# Patient Record
Sex: Female | Born: 1950
Health system: Southern US, Community
[De-identification: ages and names within clinical notes are randomized; demographics above are authoritative.]

## PROBLEM LIST (undated history)

## (undated) DIAGNOSIS — C569 Malignant neoplasm of unspecified ovary: Secondary | ICD-10-CM

## (undated) DIAGNOSIS — T7840XA Allergy, unspecified, initial encounter: Secondary | ICD-10-CM

## (undated) DIAGNOSIS — I309 Acute pericarditis, unspecified: Secondary | ICD-10-CM

## (undated) DIAGNOSIS — C50919 Malignant neoplasm of unspecified site of unspecified female breast: Secondary | ICD-10-CM

## (undated) DIAGNOSIS — I639 Cerebral infarction, unspecified: Secondary | ICD-10-CM

## (undated) DIAGNOSIS — I609 Nontraumatic subarachnoid hemorrhage, unspecified: Secondary | ICD-10-CM

## (undated) DIAGNOSIS — M1990 Unspecified osteoarthritis, unspecified site: Secondary | ICD-10-CM

## (undated) DIAGNOSIS — Z803 Family history of malignant neoplasm of breast: Secondary | ICD-10-CM

## (undated) DIAGNOSIS — M810 Age-related osteoporosis without current pathological fracture: Secondary | ICD-10-CM

## (undated) DIAGNOSIS — Z8481 Family history of carrier of genetic disease: Secondary | ICD-10-CM

## (undated) DIAGNOSIS — Z800 Family history of malignant neoplasm of digestive organs: Secondary | ICD-10-CM

## (undated) HISTORY — PX: POLYPECTOMY: SHX149

## (undated) HISTORY — DX: Nontraumatic subarachnoid hemorrhage, unspecified: I60.9

## (undated) HISTORY — DX: Age-related osteoporosis without current pathological fracture: M81.0

## (undated) HISTORY — DX: Malignant neoplasm of unspecified site of unspecified female breast: C50.919

## (undated) HISTORY — DX: Allergy, unspecified, initial encounter: T78.40XA

## (undated) HISTORY — DX: Family history of malignant neoplasm of digestive organs: Z80.0

## (undated) HISTORY — DX: Family history of carrier of genetic disease: Z84.81

## (undated) HISTORY — PX: TONSILLECTOMY: SUR1361

## (undated) HISTORY — PX: TOTAL ABDOMINAL HYSTERECTOMY W/ BILATERAL SALPINGOOPHORECTOMY: SHX83

## (undated) HISTORY — PX: HERNIA REPAIR: SHX51

## (undated) HISTORY — DX: Acute pericarditis, unspecified: I30.9

## (undated) HISTORY — DX: Family history of malignant neoplasm of breast: Z80.3

## (undated) HISTORY — DX: Cerebral infarction, unspecified: I63.9

---

## 1999-08-16 ENCOUNTER — Other Ambulatory Visit: Admission: RE | Admit: 1999-08-16 | Discharge: 1999-08-16 | Payer: Self-pay | Admitting: Obstetrics and Gynecology

## 2000-01-19 ENCOUNTER — Encounter (INDEPENDENT_AMBULATORY_CARE_PROVIDER_SITE_OTHER): Payer: Self-pay | Admitting: Specialist

## 2000-01-19 ENCOUNTER — Ambulatory Visit (HOSPITAL_BASED_OUTPATIENT_CLINIC_OR_DEPARTMENT_OTHER): Admission: RE | Admit: 2000-01-19 | Discharge: 2000-01-19 | Payer: Self-pay | Admitting: Surgery

## 2001-01-17 DIAGNOSIS — C569 Malignant neoplasm of unspecified ovary: Secondary | ICD-10-CM

## 2001-01-17 HISTORY — DX: Malignant neoplasm of unspecified ovary: C56.9

## 2001-04-12 ENCOUNTER — Other Ambulatory Visit: Admission: RE | Admit: 2001-04-12 | Discharge: 2001-04-12 | Payer: Self-pay | Admitting: Obstetrics and Gynecology

## 2001-04-17 ENCOUNTER — Encounter (INDEPENDENT_AMBULATORY_CARE_PROVIDER_SITE_OTHER): Payer: Self-pay

## 2001-04-17 ENCOUNTER — Inpatient Hospital Stay (HOSPITAL_COMMUNITY): Admission: RE | Admit: 2001-04-17 | Discharge: 2001-04-20 | Payer: Self-pay | Admitting: *Deleted

## 2001-04-25 ENCOUNTER — Ambulatory Visit: Admission: RE | Admit: 2001-04-25 | Discharge: 2001-04-25 | Payer: Self-pay | Admitting: Gynecology

## 2007-06-09 ENCOUNTER — Emergency Department (HOSPITAL_COMMUNITY): Admission: EM | Admit: 2007-06-09 | Discharge: 2007-06-09 | Payer: Self-pay | Admitting: Family Medicine

## 2007-08-22 ENCOUNTER — Other Ambulatory Visit: Admission: RE | Admit: 2007-08-22 | Discharge: 2007-08-22 | Payer: Self-pay | Admitting: Gynecology

## 2010-06-04 NOTE — Consult Note (Signed)
[facility]  Patient:    KIOSHA, BUCHAN Visit Number: [id number] MRN: [id number]          Service Type: Attending:  Rande Brunt. Clarke-Pearson, M.D. Dictated by:   Rande Brunt. Clarke-Pearson, M.D. Proc. Date: 04/17/01   CC:         Telford Nab, R.N.   Consultation Report  HISTORY OF PRESENT ILLNESS:  The patient is a 60 year old white female, patient of Dr. Jeanine Luz.  She presented initially with semiacute abdominal pain last Friday and was found to have bilateral complex ovarian cysts.  At the time of exploratory laparotomy today she was found to have bilateral complex cysts which, on frozen section, were ovarian cancer.  Consultation was sought by Dr. Jeanine Luz regarding surgical management intraoperatively.  Given the patients surgical findings included  apparent nodule of metastatic disease in the sigmoid mesentery which was excised.  The remainder of the abdomen and pelvis were free of disease and, therefore, comprehensive surgical staging was advised.  IMPRESSION:  Poorly differentiated ovarian cancer.  PLAN:  Recommend the patient complete comprehensive surgical staging including omentectomy, pelvic and peritoneal biopsies, pelvic and para-aortic lymphadenectomy, and resection of any other obvious tumor nodules. Dictated by:   Rande Brunt. Clarke-Pearson, M.D. Attending:  Rande Brunt. Clarke-Pearson, M.D. DD:  04/18/01 TD:  04/19/01 Job: [id number]

## 2010-06-04 NOTE — Discharge Summary (Signed)
[facility]  Patient:    Vicki Hale, Vicki Hale Visit Number: [id number] MRN: [id number]          Service Type: SUR Location: 4W [id number] 01 Attending Physician:  Collene Schlichter Dictated by:   Almedia Balls Randell Patient, M.D. Admit Date:  04/17/2001 Discharge Date: 04/20/2001   CC:         Vicki Hale, M.D.   Discharge Summary  HISTORY OF PRESENT ILLNESS:  The patient is a 60 year old who had a symptomatic large pelvic cystic mass found by Harl Bowie, M.D., approximately five days prior to admission.  Because of the discomfort and the size of the mass, it was felt that immediate admission was necessary.  The remainder of her history and physical are as previously dictated.  LABORATORY DATA:  Hemoglobin 13.9.  The hemoglobin on April 18, 2001, was 12.5. Glucose elevated at 147, BUN low at 4, calcium low at 8.3, total protein low at 5.1, albumin low at 2.8.  The electrocardiogram showed sinus bradycardia, but otherwise was normal.  HOSPITAL COURSE:  The patient was taken to the operating room on April 17, 2001, at which time exploratory laparotomy, TAH, BSO, omentectomy, node dissection, and peritoneal biopsies were performed for what was initially felt to be a stage IB carcinoma of the ovaries.  The patient did well postoperatively.  Diet and ambulation progressed over several days postoperatively.  On the morning of April 20, 2001, she was afebrile and had a return of bowel activity, so it was felt that she could be discharged at this time.  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  To be determined later.  OPERATIONS: 1. Total abdominal hysterectomy. 2. Bilateral salpingo-oophorectomy. 3. Omentectomy. 4. Lymph node dissection. 5. Peritoneal biopsies.  Report to date on the pathology showed poorly differentiated probable serous cystadenocarcinoma of both ovaries, positive nodules in the  posterior cul-de-sac, and paracecal areas, negative lymph nodes, and positive cytology with cytology positive of the diaphragm scrapings.  Peritoneal biopsies were negative.  The final report on the omentum is unknown at this time.  DISPOSITION:  Discharged home.  FOLLOW-UP:  To return to see Vicki Hale, M.D., on April 25, 2001, and to return to our office in approximately two weeks for follow-up.  ACTIVITY:  She was instructed to gradually progress her activities over several weeks at home and to limit lifting and driving for two weeks.  She was fully ambulatory.  DIET:  Regular.  CONDITION ON DISCHARGE:  Good.  DISCHARGE MEDICATIONS:  She was given prescriptions for Darvocet-N 100 or generic, #30, to be taken one or two q.4h. p.r.n. pain and Vivelle Dot 0.05 mg to be changed twice weekly.  SPECIAL INSTRUCTIONS:  She will call for any problems. Dictated by:   Almedia Balls Randell Patient, M.D. Attending Physician:  Collene Schlichter DD:  04/21/01 TD:  04/23/01 Job: [id number]

## 2010-06-04 NOTE — Op Note (Signed)
[facility]  Patient:    Vicki Hale, Vicki Hale Visit Number: [id number] MRN: [id number]          Service Type: Attending:  Almedia Balls. Randell Patient, M.D. Dictated by:   Almedia Balls Randell Patient, M.D. Proc. Date: 04/17/01                             Operative Report  PREOPERATIVE DIAGNOSES:  Large cystic pelvic mass, pelvic pain, elevated CA 125, rule out carcinoma.  POSTOPERATIVE DIAGNOSES:  Stage 1B, possible stage 2 carcinoma of the ovary poorly differentiated on frozen section.  OPERATION PERFORMED:  Abdominal supracervical hysterectomy, bilateral salpingo-oophorectomy, omentectomy, lymph node dissection by Dr. Stanford Breed who will dictate that portion of the procedure.  ANESTHESIA:  General oral tracheal.  SURGEON:  Almedia Balls. Randell Patient, M.D.  FIRST ASSISTANT:  Harl Bowie, M.D. and Rande Brunt. Clarke-Pearson, M.D. who did the node dissection.  INDICATIONS FOR PROCEDURE:  The patient is a 60 year old with approximately a one week history of pelvic pain who was evaluated by Dr. Laureen Ochs and referred to [place] for evaluation.  She was seen in our office on April 13, 2001 with findings of a large pelvic mass which was cystic on ultrasound. CA 125 was elevated at 35.6. She was counselled as to the need for surgery to evaluate and treat this problem and the type surgery to be performed which would possibly include a hysterectomy, bilateral salpingo-oophorectomy, omentectomy and lymph node dissection. She was fully counselled as to the nature of each portion of this procedure and the risks involved to include risks of anesthesia, injury to bowel, bladder, blood vessels, ureters, postoperative hemorrhage, infection, and recuperation. She fully understands all these considerations and wishes to proceed on April 17, 2001.  OPERATIVE FINDINGS:  On entry into the abdomen, there was a large cystic mass which rose out of the right ovary. The right adnexal structures were  slightly torded. The left ovary contained a multicystic mass as well. The uterus appeared normal. The right ovarian cyst was approximately 12-14 cm in greatest dimensions. Exploration of the entire abdomen revealed that the diaphragm, liver surface, spleen, periaortic areas, kidneys, area of the appendix and the appendix itself were normal except for a small nodule just off the cecum near the appendix. There was an approximately 1.5 x 1 cm nodular area to the right of the descending rectosigmoid in the posterior cul-de-sac. Frozen section on the ovarian cyst revealed bilateral carcinoma of a poorly differentiated type probably serous.  DESCRIPTION OF PROCEDURE:  With the patient under general anesthesia, prepared and in the usual sterile fashion, with a Foley catheter in the bladder, a lower abdominal transverse incision was made after excising a previous cesarean section scar. The incision was carried into the peritoneal cavity without difficulty. Pelvic washings were taken. The cyst was encountered immediately on the right ovary and was brought up through the incision. Heaney clamps were placed across the base of the cyst which was then excised and sent for frozen section. The left ovary was also brought up out of the incision, and because of the multicystic nature of this ovary, Heaney clamps were placed across the base for excision and frozen section on this structure as well. Both were returned with a diagnosis as noted above. It was then possible to place a self retaining retractor and pack off the bowel. Kelly clamps were used to clamp the utero-ovarian anastomoses, tubes, and round ligaments bilaterally for traction and  hemostasis. The round ligaments were then transected using Bovie electrocoagulation with entry into the retroperitoneal space, development of the paravesical and pararectal spaces. The bladder flap was developed on the anterior surface of the uterus. Skeletonization  of the vessels was carried out bilaterally, and then these vascular bundles were clamped bilaterally, cut, and suture ligated with #1 chromic catgut. The cardinal ligaments were likewise clamped bilaterally, cut, and suture ligated with #1 chromic catgut. It was then possible to core out the endocervix leaving a rim of exocervix for support and vascularity. Interrupted figure-of-eight sutures were used to reapproximate the cervical stump and to render it hemostatic. A report was then obtained that the ovaries were malignant.  Attention was then directed to the omentectomy which was performed by sequentially clamping across the omentum using Kelly clamps with excision of the omentum. The remaining portion of the omentum was rendered hemostatic with suture ligatures of #1 chromic catgut. Further extension of the incisions for the retroperitoneal space was then accomplished bilaterally. At this point, Dr. Stanford Breed was present in the operating room and continued with the lymph node dissection which consisted of excision of pelvic periaortic lymph node tissue, peritoneal biopsies, excision of a nodule on the cecum and obtaining cytologic study of the diaphragm. As noted above, he will dictate this portion of the procedure. After noting that hemostasis was maintained following all surgery and that sponge and instrument counts were correct x2, the peritoneum was closed with a continuous suture of #0 Vicryl after a copious lavage with lactated Ringers solution. The fascia was closed with two continuous sutures of #0 Vicryl which were brought from the lateral aspects of the incision and tied separately in the midline. The subcutaneous fat was closed with interrupted sutures of #0 Vicryl. The skin was closed with a subcuticular suture of 3-0 plain catgut. Estimated blood loss of 600 ml. The patient was taken to the recovery room in good condition with clear urine in the Foley catheter tubing.  She will be admitted following surgery. Dictated by:   Almedia Balls Randell Patient, M.D. Attending:  Almedia Balls. Randell Patient, M.D. DD:  04/17/01 TD:  04/18/01  Job: [id number]

## 2010-06-04 NOTE — Op Note (Signed)
[facility]  Patient:    Vicki Hale, Vicki Hale                      MRN: [id number] Proc. Date: 01/19/00 Adm. Date:  [id number] Attending:  Charlton Haws CC:         Harl Bowie, M.D.   Operative Report  CCS# [id number]  PREOPERATIVE DIAGNOSIS:  Right inguinal hernia.  POSTOPERATIVE DIAGNOSIS:  Right indirect inguinal hernia.  OPERATION PERFORMED:  Repair of right inguinal hernia with mesh.  SURGEON:  Currie Paris, M.D.  ANESTHESIA:  MAC.  INDICATIONS FOR PROCEDURE:  This patient has small symptomatic right inguinal hernia.  It was decided to proceed to elective repair.  DESCRIPTION OF PROCEDURE:  The patient was brought to the operating room after having the site identified preoperatively.  She was given IV sedation and the right side was prepped and draped as a sterile field.  She was given IV sedation.  1% Xylocaine was mixed equally with 0.5% Marcaine with epinephrine and used for local.  I infiltrated along the incision line and then in a subfascial area above and the anterior superior iliac spine.  Incision was made and deepened to the external oblique aponeurosis with additional local being infiltrated as needed and as we went through layers.  Vessels were either coagulated or clamped, ligated with 4-0 Vicryl.  The external oblique was opened in line with its fibers to expose the underlying tissues and the round ligament with the ilioinguinal nerve was found and dissected up off the inguinal floor and surrounded with a Penrose drain.  I separated the ilioinguinal nerve a little bit off of that to get a little better exposure of the structures and with a little dissection, the anteromedial aspect of these structures was able to identify a hernia sac.  This was stripped out from the distal aspect and then cleaned off and stripped down to the deep ring where it was twisted, suture ligated with a 2-0 silk, tied with 2-0 silk and  amputated. It retracted neatly in to the deep ring.  There appeared to be no other indirect defect although just medial to the deep ring was a little bit of weakness. I elected therefore, to put a piece of mesh in and used a mesh patch that comes with a plug and patch kit, laid it along the floor so that it overlapped the pubic tubercle.  It was sutured in with a 2-0 Prolene laterally and then tacked medially to the internal oblique.  The tails went around the residual of the round ligament which was left in situ and not divided. Everything appeared to be dry.  The external oblique was closed over the repair with 3-0 Vicryl, Scarpas with 3-0 Vicryl, and skin with 4-0 Monocryl subcuticular plus Steri-Strips.  The patient tolerated the procedure well.  There were no operative complications. All counts were correct. DD:  01/19/00 TD:  01/19/00 Job: [id number]

## 2010-06-04 NOTE — Consult Note (Signed)
[facility]  Patient:    Vicki Hale, Vicki Hale Visit Number: [id number] MRN: [id number]          Service Type: GON Location: GYN Attending Physician:  Jeannette Corpus Dictated by:   Rande Brunt. Clarke-Pearson, M.D. Proc. Date: 04/25/01 Admit Date:  04/25/2001   CC:         Almedia Balls. Randell Patient, M.D.  Harl Bowie, M.D.  Telford Nab, R.N.   Consultation Report  HISTORY OF PRESENT ILLNESS:  This 60 year old white female is seen in consultation at the request of Dr. Jeanine Luz regarding management of a newly diagnosed ovarian cancer.  The patient underwent exploratory laparotomy on April 17, 2001, for management of abdominal pain and a pelvic mass.  She was found to have a poorly differentiated ovarian carcinoma involving both ovaries.  Further surgical staging including total abdominal hysterectomy and bilateral salpingo-oophorectomy, omentectomy, pelvic and periaortic lymphadenectomy, multiple peritoneal biopsies, and excision of peritoneal nodules revealed the patient had stage IIIB disease by virtue of metastasis to the cecum.  In addition, she had positive peritoneal washings, a positive right diaphragm scrape, and a single implant in the pelvis.  All lymph nodes are free of disease as was the omentum.  At the completion of the surgical procedure, there was no residual disease identifiable.  The patient has had an uncomplicated postoperative course.  PHYSICAL EXAMINATION:  VITAL SIGNS:  Height 5 feet 6 inches, weight 140.5 pounds, blood pressure 116/80, pulse 80, respirations 16.  IMPRESSION:  Stage IIIB completely resected poorly differentiated ovarian cancer.  RECOMMENDATIONS:  I had approximately a one hour consultation face to face with the patient and her husband and mother, outlining the natural history of this disease, the patients prognosis, and treatment options.  We also discussed potential side effects of chemotherapy.  We  talked about standard therapy with carboplatin and Taxol, and also the potential for participating in GOG protocol 182.  After discussing all this, the patient and her husband have indicated they would like to be treated at [facility] as this is nearly as close to their home as is [facility].  We will arrange for her to be seen at [facility] within the next week and initiate chemotherapy hopefully within two weeks.  All of their questions are answered for today. Dictated by:   Rande Brunt. Clarke-Pearson, M.D. Attending Physician:  Jeannette Corpus DD:  04/25/01 TD:  04/25/01 Job: [id number]
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
## 2010-06-04 NOTE — Op Note (Signed)
[facility]  Patient:    Vicki Hale, Vicki Hale Visit Number: [id number] MRN: [id number]          Service Type: Attending:  Rande Brunt. Clarke-Pearson, M.D. Dictated by:   Rande Brunt. Clarke-Pearson, M.D.   CC:         Almedia Balls. Randell Patient, M.D.  Harl Bowie, M.D.  Telford Nab, R.N.   Operative Report  PREOPERATIVE DIAGNOSIS:  Complex adnexal masses.  POSTOPERATIVE DIAGNOSIS:  Ovarian cancer.  SURGEON:  Daniel L. Clarke-Pearson, M.D.  PROCEDURE: 1. Pelvic and periaortic lymphadenectomy. 2. Multiple peritoneal biopsies. 3. Excision of peritoneal (cecal) nodule.  ASSISTANT:  Almedia Balls. Randell Patient, M.D.  ANESTHESIA:  General with orotracheal tube.  ESTIMATED BLOOD LOSS:  100 cc for this portion of the procedure.  SURGICAL FINDINGS:  Upon entering the operating room and exploring the pelvis, the patient had previously undergone a total abdominal hysterectomy and bilateral salpingo-oophorectomy, partial omentectomy and resection of a pelvic nodule. Exploration of the upper abdomen, including the diaphragm, liver capsule, spleen, stomach, residual omentum, small and large bowel revealed no evidence of metastatic disease except for one minimally suspicious nodule measuring approximately 5 mm on the cecum. This was excised in its entirety. Inspection of the pelvis, including pelvic and periaortic lymph nodes, revealed no suggestion of metastatic disease. At the completion the surgical procedure, there was no gross residual disease.  DESCRIPTION OF PROCEDURE:  When I entered the operating room, the abdomen was previously opened through a Pfannenstiel incision. The abdomen and pelvis were explored with the above-noted findings. A Bookwalter retractor was assembled and the bowel packed out of the pelvis. Attention was turned to the pelvic sidewalls. The retroperitoneal spaces were opened developing the pararectal and paravesical spaces. In a systematic fashion, the  external iliac lymph nodes from the external iliac artery and vein were harvested. Hemostasis was achieved with cautery and Hemoclips. Using the vein retractor to elevate the external iliac vein, the obturator fossa was opened and the obturator lymph nodes were excised with care to avoid injury to the obturator nerve. Again, Hemoclips were used for hemostasis. A similar procedure was performed on the opposite side of the pelvis.  The Bookwalter retractor was positioned to explore the upper abdomen. Using the [facility] blades, the upper abdomen was identified. The small bowel was mobilized to the right and an incision was made over the peritoneum on the right common iliac artery and along the right side of the aorta. With the right ureter reflected laterally, the lymph nodes overlying the vena cava and aorta were dissected in their entirety. There was no suspicious lymph nodes in this group. These were submitted as right periaortic lymph nodes.  Attention was turned to the left side of the abdomen where an incision was made along the white line of Toldt along the descending colon. The descending colon and ureter were mobilized medially, thus exposing the aorta and the left side of the aortic lymph nodes. In a stepwise fashion, the lymph nodes from the common iliac chain and aortic chain were excised. Again, hemostasis was achieved with Hemoclips. These were submitted to pathology as the left periaortic lymph nodes.  Peritoneal biopsies were obtained from the right and left paracolic gutters, right and left pelvis, anterior and posterior cul-de-sac. A scraping from the right diaphragm was obtained and submitted to cytopathology as a "Pap smear".  The pelvis and abdomen were reassessed and hemostasis was adequate. At this juncture, I left the operating room and Dr. Jeanine Luz and  Dr. Leona Singleton closed the abdomen. Dictated by:   Rande Brunt. Clarke-Pearson, M.D. Attending:  Rande Brunt.  Clarke-Pearson, M.D. DD:  04/18/01 TD:  04/19/01 Job: [id number]

## 2010-06-30 ENCOUNTER — Other Ambulatory Visit: Payer: Self-pay | Admitting: Dermatology

## 2010-10-13 LAB — CULTURE, ROUTINE-ABSCESS

## 2011-06-30 ENCOUNTER — Other Ambulatory Visit: Payer: Self-pay

## 2014-01-17 HISTORY — PX: COLONOSCOPY: SHX174

## 2014-05-08 ENCOUNTER — Other Ambulatory Visit: Payer: Self-pay | Admitting: Family Medicine

## 2014-05-08 DIAGNOSIS — R51 Headache: Principal | ICD-10-CM

## 2014-05-08 DIAGNOSIS — R519 Headache, unspecified: Secondary | ICD-10-CM

## 2014-05-09 ENCOUNTER — Ambulatory Visit
Admission: RE | Admit: 2014-05-09 | Discharge: 2014-05-09 | Disposition: A | Discharge status: Home / Self Care | Payer: BC Managed Care – PPO | Source: Ambulatory Visit | Attending: Family Medicine | Admitting: Family Medicine

## 2014-05-09 DIAGNOSIS — R519 Headache, unspecified: Secondary | ICD-10-CM

## 2014-05-09 DIAGNOSIS — R51 Headache: Principal | ICD-10-CM

## 2014-05-09 MED ORDER — GADOBENATE DIMEGLUMINE 529 MG/ML IV SOLN
15.0000 mL | Freq: Once | INTRAVENOUS | Status: AC | PRN
  Administered 2014-05-09: 15 mL via INTRAVENOUS

## 2014-05-11 ENCOUNTER — Emergency Department (HOSPITAL_COMMUNITY): Payer: BC Managed Care – PPO

## 2014-05-11 ENCOUNTER — Encounter (HOSPITAL_COMMUNITY): Payer: Self-pay | Admitting: *Deleted

## 2014-05-11 ENCOUNTER — Inpatient Hospital Stay (HOSPITAL_COMMUNITY)
Admission: EM | Admit: 2014-05-11 | Discharge: 2014-05-15 | DRG: 066 | Disposition: A | Discharge status: Home / Self Care | Payer: BC Managed Care – PPO | Attending: Internal Medicine | Admitting: Internal Medicine

## 2014-05-11 DIAGNOSIS — Z880 Allergy status to penicillin: Secondary | ICD-10-CM

## 2014-05-11 DIAGNOSIS — Z8543 Personal history of malignant neoplasm of ovary: Secondary | ICD-10-CM

## 2014-05-11 DIAGNOSIS — R001 Bradycardia, unspecified: Secondary | ICD-10-CM | POA: Diagnosis not present

## 2014-05-11 DIAGNOSIS — I609 Nontraumatic subarachnoid hemorrhage, unspecified: Secondary | ICD-10-CM | POA: Diagnosis not present

## 2014-05-11 DIAGNOSIS — H538 Other visual disturbances: Secondary | ICD-10-CM | POA: Diagnosis not present

## 2014-05-11 HISTORY — DX: Malignant neoplasm of unspecified ovary: C56.9

## 2014-05-11 LAB — BASIC METABOLIC PANEL
Anion gap: 12 (ref 5–15)
BUN: 13 mg/dL (ref 6–23)
CO2: 20 mmol/L (ref 19–32)
Calcium: 9.4 mg/dL (ref 8.4–10.5)
Chloride: 101 mmol/L (ref 96–112)
Creatinine, Ser: 0.87 mg/dL (ref 0.50–1.10)
GFR calc Af Amer: 81 mL/min — ABNORMAL LOW (ref >90)
GFR calc non Af Amer: 70 mL/min — ABNORMAL LOW (ref >90)
Glucose, Bld: 139 mg/dL — ABNORMAL HIGH (ref 70–99)
Potassium: 3.8 mmol/L (ref 3.5–5.1)
Sodium: 133 mmol/L — ABNORMAL LOW (ref 135–145)

## 2014-05-11 LAB — CBC
HCT: 42.5 % (ref 36.0–46.0)
Hemoglobin: 14.4 g/dL (ref 12.0–15.0)
MCH: 29.4 pg (ref 26.0–34.0)
MCHC: 33.9 g/dL (ref 30.0–36.0)
MCV: 86.9 fL (ref 78.0–100.0)
Platelets: 285 10*3/uL (ref 150–400)
RBC: 4.89 MIL/uL (ref 3.87–5.11)
RDW: 12.9 % (ref 11.5–15.5)
WBC: 8.7 10*3/uL (ref 4.0–10.5)

## 2014-05-11 MED ORDER — HYDROMORPHONE HCL 1 MG/ML IJ SOLN
1.0000 mg | Freq: Once | INTRAMUSCULAR | Status: AC
Start: 2014-05-11 — End: 2014-05-11
  Administered 2014-05-11: 1 mg via INTRAVENOUS
  Filled 2014-05-11: qty 1

## 2014-05-11 MED ORDER — ONDANSETRON HCL 4 MG/2ML IJ SOLN
4.0000 mg | Freq: Once | INTRAMUSCULAR | Status: AC
  Administered 2014-05-11: 4 mg via INTRAVENOUS
  Filled 2014-05-11: qty 2

## 2014-05-11 NOTE — ED Notes (Signed)
Pt states that she started experiencing severe headaches with nausea on Friday the 15th. States she also had the same the next Sunday and Tuesday. Saw her PCP Wednesday and went to MRI on Friday. States that her PCP called her and stated that she had a subarachnoid bleed but no aneurysm. Pt reports that she began having another severe headache and nausea 3 hours ago. Pt states that she has taken 3 extra strength tylenols and 1/2 hydrocodone with no relief.

## 2014-05-11 NOTE — ED Notes (Signed)
Patient transported to CT 

## 2014-05-11 NOTE — ED Provider Notes (Signed)
CSN: [id number]     Arrival date & time 05/11/14  2214 History   First MD Initiated Contact with Patient 05/11/14 2240     Chief Complaint  Patient presents with  . Headache     (Consider location/radiation/quality/duration/timing/severity/associated sxs/prior Treatment) HPI Comments: Patient presents emergency department with chief complaint of headache. Patient states that she has been experiencing intermittent severe headaches 1 week. States that she had an MRI on Friday of this week and was told that she had a small subarachnoid hemorrhage. Her primary care doctor is arranging follow-up with the neurosurgeon for her. Patient states that today her headache returned and was very severe. She states that the pain is 9 out of 10. She has taken Tylenol and hydrocodone with no relief. She denies any vision changes, slurred speech, numbness, tingling, or weakness of the extremities. There are no aggravating or alleviating factors.  The history is provided by the patient. No language interpreter was used.    Past Medical History  Diagnosis Date  . Ovarian cancer    Past Surgical History  Procedure Laterality Date  . Tonsillectomy    . Hernia repair     No family history on file. History  Substance Use Topics  . Smoking status: Never Smoker   . Smokeless tobacco: Not on file  . Alcohol Use: Yes     Comment: ocassional   OB History    No data available     Review of Systems  Constitutional: Negative for fever and chills.  Respiratory: Negative for shortness of breath.   Cardiovascular: Negative for chest pain.  Gastrointestinal: Negative for nausea, vomiting, diarrhea and constipation.  Genitourinary: Negative for dysuria.  Neurological: Positive for headaches.      Allergies  Penicillins  Home Medications   Prior to Admission medications   Not on File   BP 168/90 mmHg  Pulse 56  Temp(Src) 98.3 F (36.8 C) (Oral)  Resp 18  Ht 5\' 7"  (1.702 m)  Wt 175 lb (79.379  kg)  BMI 27.40 kg/m2  SpO2 94% Physical Exam  Constitutional: She is oriented to person, place, and time. She appears well-developed and well-nourished.  HENT:  Head: Normocephalic and atraumatic.  Right Ear: External ear normal.  Left Ear: External ear normal.  Eyes: Conjunctivae and EOM are normal. Pupils are equal, round, and reactive to light.  Neck: Normal range of motion. Neck supple.  No pain with neck flexion, no meningismus  Cardiovascular: Normal rate, regular rhythm and normal heart sounds.  Exam reveals no gallop and no friction rub.   No murmur heard. Pulmonary/Chest: Effort normal and breath sounds normal. No respiratory distress. She has no wheezes. She has no rales. She exhibits no tenderness.  Abdominal: Soft. She exhibits no distension and no mass. There is no tenderness. There is no rebound and no guarding.  Musculoskeletal: Normal range of motion. She exhibits no edema or tenderness.  Normal gait.  Neurological: She is alert and oriented to person, place, and time. She has normal reflexes.  CN 3-12 intact, normal finger to nose, no pronator drift, normal heel-to-shin, no visual field cuts, sensation and strength intact bilaterally.  Skin: Skin is warm and dry.  Psychiatric: She has a normal mood and affect. Her behavior is normal. Judgment and thought content normal.  Nursing note and vitals reviewed.   ED Course  Procedures (including critical care time) Labs Review Labs Reviewed  CBC  BASIC METABOLIC PANEL    Imaging Review No results found.  EKG Interpretation None      MDM   Final diagnoses:  None    Patient with known subarachnoid hemorrhage and worsening headache. Also having vomiting secondary to headache. Will treat pain, nausea, and check CT.  Asked CT to take the patient quickly.  CT shows persistent subarachnoid hemorrhage. I was informed by the radiologist that volumes cannot be compared between MRI and CT.  Patient is  neurovascularly intact.    Discussed the patient with Dr. Lita Mains, who recommends consulting neurosurgery.  Patient's pain has significantly improved.   1:22 AM Patient signed out to Dr. Lita Mains, who will speak with neurosurgery and dispo patient.   Montine Circle, PA-C 05/12/14 0123  Julianne Rice, MD 05/12/14 [phone]

## 2014-05-12 DIAGNOSIS — H539 Unspecified visual disturbance: Secondary | ICD-10-CM | POA: Diagnosis not present

## 2014-05-12 DIAGNOSIS — R001 Bradycardia, unspecified: Secondary | ICD-10-CM | POA: Diagnosis not present

## 2014-05-12 DIAGNOSIS — H538 Other visual disturbances: Secondary | ICD-10-CM | POA: Diagnosis not present

## 2014-05-12 DIAGNOSIS — I609 Nontraumatic subarachnoid hemorrhage, unspecified: Principal | ICD-10-CM

## 2014-05-12 DIAGNOSIS — Z8543 Personal history of malignant neoplasm of ovary: Secondary | ICD-10-CM | POA: Diagnosis not present

## 2014-05-12 DIAGNOSIS — Z880 Allergy status to penicillin: Secondary | ICD-10-CM | POA: Diagnosis not present

## 2014-05-12 LAB — MRSA PCR SCREENING: MRSA by PCR: NEGATIVE

## 2014-05-12 MED ORDER — ACETAMINOPHEN 325 MG PO TABS
650.0000 mg | ORAL_TABLET | Freq: Four times a day (QID) | ORAL | Status: DC | PRN
  Administered 2014-05-12 – 2014-05-15 (×4): 650 mg via ORAL
  Filled 2014-05-12 (×4): qty 2

## 2014-05-12 MED ORDER — GUAIFENESIN-DM 100-10 MG/5ML PO SYRP: 5.0000 mL | ORAL_SOLUTION | ORAL | Status: DC | PRN

## 2014-05-12 MED ORDER — ALUM & MAG HYDROXIDE-SIMETH 200-200-20 MG/5ML PO SUSP: 30.0000 mL | Freq: Four times a day (QID) | ORAL | Status: DC | PRN

## 2014-05-12 MED ORDER — HYDRALAZINE HCL 20 MG/ML IJ SOLN: 10.0000 mg | Freq: Four times a day (QID) | INTRAMUSCULAR | Status: DC | PRN

## 2014-05-12 MED ORDER — DOCUSATE SODIUM 100 MG PO CAPS
200.0000 mg | ORAL_CAPSULE | Freq: Two times a day (BID) | ORAL | Status: DC
  Administered 2014-05-12 – 2014-05-15 (×6): 200 mg via ORAL
  Filled 2014-05-12 (×5): qty 2

## 2014-05-12 NOTE — ED Notes (Signed)
Neuro MD at bedside

## 2014-05-12 NOTE — ED Notes (Signed)
Patient had episode of vomiting upon returning to room from [place], [place] aware, advised patient can have 4mg  of zofran.

## 2014-05-12 NOTE — Progress Notes (Signed)
Admission history reviewed. Pt has no hx of HA, is a non-smoker, does not use any Rx medications, and has no FH of intracranial hemorrhage or aneurysm.  EXAM:  BP 93/56 mmHg  Pulse 67  Temp(Src) 98 F (36.7 C) (Oral)  Resp 20  Ht 5\' 7"  (1.702 m)  Wt 78 kg (171 lb 15.3 oz)  BMI 26.93 kg/m2  SpO2 95%  Awake, alert, oriented  Speech fluent, appropriate  CN grossly intact  5/5 BUE/BLE   IMPRESSION:  64 y.o. female with left convexity SAH of unclear etiology. History and radiographic findings are very atypical for aneurysmal SAH, other possibilities include RCVS or amyloid  PLAN: - Will plan on diagnostic cerebral angiogram for further workup tomorrow.  I reviewed imaging findings and the need for angiogram with the patient and family. All questions were answered.

## 2014-05-12 NOTE — H&P (Signed)
[facility] History and Physical  Vicki Hale WIO:[id number] DOB: Apr 06, 1950 DOA: 05/11/2014  Referring physician: EDP PCP: Leonides Sake, MD   Chief Complaint: Headache   HPI: Vicki Hale is a 64 y.o. female who developed sudden onset of severe headache 10 days ago.  This was so severe that it caused her to drop to her knees she says.  Very severe for several hours but then gradually resolved.  Had 2nd episode a couple days later and saw PCP.  PCP ordered MRI 4 days ago.  MRI was performed 2 days ago and showed SAH.  PCP wanted angiogram apparently but this was apparently rejected by insurance.  Patient now presents to ED with 3rd episode of severe headache.  Headache is resolved at this time thankfully.  Review of Systems: Systems reviewed.  As above, otherwise negative  Past Medical History  Diagnosis Date  . Ovarian cancer    Past Surgical History  Procedure Laterality Date  . Tonsillectomy    . Hernia repair     Social History:  reports that she has never smoked. She does not have any smokeless tobacco history on file. She reports that she drinks alcohol. She reports that she does not use illicit drugs.  Allergies  Allergen Reactions  . Penicillins Rash    No family history on file.   Prior to Admission medications   Medication Sig Start Date End Date Taking? Authorizing Provider  acetaminophen (TYLENOL) 500 MG tablet Take 500 mg by mouth every 6 (six) hours as needed for headache.   Yes Historical Provider, MD  Chlorpheniramine-PSE-Ibuprofen (ADVIL ALLERGY SINUS PO) Take 2 tablets by mouth every 8 (eight) hours as needed (for headache).   Yes Historical Provider, MD  Oxycodone HCl 10 MG TABS Take 10 mg by mouth every 8 (eight) hours as needed (for pain).  05/07/14  Yes Historical Provider, MD   Physical Exam: Filed Vitals:   05/12/14 0330  BP: 120/66  Pulse: 59  Temp:   Resp: 11    BP 120/66 mmHg  Pulse 59  Temp(Src) 97.8 F (36.6 C)  (Oral)  Resp 11  Ht 5\' 7"  (1.702 m)  Wt 79.379 kg (175 lb)  BMI 27.40 kg/m2  SpO2 93%  General Appearance:    Alert, oriented, no distress, appears stated age  Head:    Normocephalic, atraumatic  Eyes:    PERRL, EOMI, sclera non-icteric        Nose:   Nares without drainage or epistaxis. Mucosa, turbinates normal  Throat:   Moist mucous membranes. Oropharynx without erythema or exudate.  Neck:   Supple. No carotid bruits.  No thyromegaly.  No lymphadenopathy.   Back:     No CVA tenderness, no spinal tenderness  Lungs:     Clear to auscultation bilaterally, without wheezes, rhonchi or rales  Chest wall:    No tenderness to palpitation  Heart:    Regular rate and rhythm without murmurs, gallops, rubs  Abdomen:     Soft, non-tender, nondistended, normal bowel sounds, no organomegaly  Genitalia:    deferred  Rectal:    deferred  Extremities:   No clubbing, cyanosis or edema.  Pulses:   2+ and symmetric all extremities  Skin:   Skin color, texture, turgor normal, no rashes or lesions  Lymph nodes:   Cervical, supraclavicular, and axillary nodes normal  Neurologic:   CNII-XII intact. Normal strength, sensation and reflexes      throughout    Labs on Admission:  Basic Metabolic Panel:  Recent Labs Lab 05/11/14 2229  NA 133*  K 3.8  CL 101  CO2 20  GLUCOSE 139*  BUN 13  CREATININE 0.87  CALCIUM 9.4   Liver Function Tests: No results for input(s): AST, ALT, ALKPHOS, BILITOT, PROT, ALBUMIN in the last 168 hours. No results for input(s): LIPASE, AMYLASE in the last 168 hours. No results for input(s): AMMONIA in the last 168 hours. CBC:  Recent Labs Lab 05/11/14 2229  WBC 8.7  HGB 14.4  HCT 42.5  MCV 86.9  PLT 285   Cardiac Enzymes: No results for input(s): CKTOTAL, CKMB, CKMBINDEX, TROPONINI in the last 168 hours.  BNP (last 3 results) No results for input(s): PROBNP in the last 8760 hours. CBG: No results for input(s): GLUCAP in the last 168  hours.  Radiological Exams on Admission: Ct Head Wo Contrast  05/12/2014   CLINICAL DATA:  Acute onset of headache. Recent subarachnoid bleed. Initial encounter.  EXAM: CT HEAD WITHOUT CONTRAST  TECHNIQUE: Contiguous axial images were obtained from the base of the skull through the vertex without intravenous contrast.  COMPARISON:  MRI of the brain performed 05/09/2014  FINDINGS: Mild subarachnoid hemorrhage is again noted along the high left frontoparietal region, in a similar distribution to the prior study. No additional hematoma is seen. There is no evidence of mass lesion or acute infarction.  The posterior fossa, including the cerebellum, brainstem and fourth ventricle, is within normal limits. The third and lateral ventricles, and basal ganglia are unremarkable in appearance. No significant mass effect or midline shift is seen.  There is no evidence of fracture; visualized osseous structures are unremarkable in appearance. The orbits are within normal limits. The paranasal sinuses and mastoid air cells are well-aerated. No significant soft tissue abnormalities are seen.  IMPRESSION: 1. Mild acute subarachnoid hemorrhage again noted along the high left frontoparietal region, in a similar distribution to the recent prior study. The amount of subarachnoid hemorrhage is difficult to compare with prior MRI. 2. Otherwise unremarkable noncontrast CT of the head.  These results were called by telephone at the time of interpretation on 05/12/2014 at 12:15 am to [facility] PA, who verbally acknowledged these results.   Electronically Signed   By: Garald Balding M.D.   On: 05/12/2014 00:15    EKG: Independently reviewed.  Assessment/Plan Principal Problem:   SAH (subarachnoid hemorrhage)   1. SAH - need to rule out aneurysmal bleed, see Dr. Clarice Pole note 1. NPO 2. No anticoagulants 3. Q2H neuro checks 4. Angiogram planned for later this AM.    Code Status: Full  Family Communication: Husband  at bedside Disposition Plan: Admit to inpatient   Time spent: 30 min  Marelyn Rouser M. [facility] Pager [phone]  If 7AM-7PM, please contact the day team taking care of the patient [email or web address] Password [facility] [place] 05/12/2014, 4:38 AM

## 2014-05-12 NOTE — Progress Notes (Signed)
Patient ID: Vicki Hale, female   DOB: [birth year], 64 y.o.   MRN: [id number] Discussed patient's situation with Dr. Karlene Einstein. He has seen the patient and his scheduling angiography for tomorrow. Patient is pleased to have some definitive workup done. She notes that her headache is completely resolved at this time.

## 2014-05-12 NOTE — Consult Note (Signed)
Reason for Consult: Subarachnoid hemorrhage Referring Physician: Dr. Overton Mam Vicki Hale is an 64 y.o. female.  HPI: Patient is a 64 year old right-handed individual who tells me that 10 days ago she had the sudden and severe onset of headache that quite literally caused her to drop to her knees. The headache was very severe for several hours but then gradually raise all she rested a bit a few days later she had a second episode. She subsequently reported to her primary care physician ordered an MRI about 4 days ago. The MRI demonstrates the presence of some left-sided convexity subarachnoid hemorrhage. Not in a typical distribution for subarachnoid blood related to aneurysm. The primary care doctor wanted to obtain an angiogram but was removed by the insurance company. Today the patient has had another episode of severe headache and she presents to the emergency room. CT scan performed today capitulum what may be some peripherally located subarachnoid blood. Headache after some medication is improved the patient denies any stiffness in her neck.  Past Medical History  Diagnosis Date  . Ovarian cancer     Past Surgical History  Procedure Laterality Date  . Tonsillectomy    . Hernia repair      No family history on file.  Social History:  reports that she has never smoked. She does not have any smokeless tobacco history on file. She reports that she drinks alcohol. She reports that she does not use illicit drugs.  Allergies:  Allergies  Allergen Reactions  . Penicillins Rash    Medications: I have reviewed the patient's current medications.  Results for orders placed or performed during the hospital encounter of 05/11/14 (from the past 48 hour(s))  CBC  (at [facility])     Status: None   Collection Time: 05/11/14 10:29 PM  Result Value Ref Range   WBC 8.7 4.0 - 10.5 K/uL   RBC 4.89 3.87 - 5.11 MIL/uL   Hemoglobin 14.4 12.0 - 15.0 g/dL   HCT 42.5 36.0 - 46.0 %   MCV 86.9 78.0 - 100.0 fL   MCH 29.4 26.0 - 34.0 pg   MCHC 33.9 30.0 - 36.0 g/dL   RDW 12.9 11.5 - 15.5 %   Platelets 285 150 - 400 K/uL  Basic metabolic panel  (at [facility])     Status: Abnormal   Collection Time: 05/11/14 10:29 PM  Result Value Ref Range   Sodium 133 (L) 135 - 145 mmol/L   Potassium 3.8 3.5 - 5.1 mmol/L   Chloride 101 96 - 112 mmol/L   CO2 20 19 - 32 mmol/L   Glucose, Bld 139 (H) 70 - 99 mg/dL   BUN 13 6 - 23 mg/dL   Creatinine, Ser 0.87 0.50 - 1.10 mg/dL   Calcium 9.4 8.4 - 10.5 mg/dL   GFR calc non Af Amer 70 (L) >90 mL/min   GFR calc Af Amer 81 (L) >90 mL/min    Comment: (NOTE) The eGFR has been calculated using the CKD EPI equation. This calculation has not been validated in all clinical situations. eGFR's persistently <90 mL/min signify possible Chronic Kidney Disease.    Anion gap 12 5 - 15    Ct Head Wo Contrast  05/12/2014   CLINICAL DATA:  Acute onset of headache. Recent subarachnoid bleed. Initial encounter.  EXAM: CT HEAD WITHOUT CONTRAST  TECHNIQUE: Contiguous axial images were obtained from the base of the skull through the vertex without intravenous contrast.  COMPARISON:  MRI of the brain performed 05/09/2014  FINDINGS: Mild subarachnoid hemorrhage is again noted along the high left frontoparietal region, in a similar distribution to the prior study. No additional hematoma is seen. There is no evidence of mass lesion or acute infarction.  The posterior fossa, including the cerebellum, brainstem and fourth ventricle, is within normal limits. The third and lateral ventricles, and basal ganglia are unremarkable in appearance. No significant mass effect or midline shift is seen.  There is no evidence of fracture; visualized osseous structures are unremarkable in appearance. The orbits are within normal limits. The paranasal sinuses and mastoid air cells are well-aerated. No significant soft tissue abnormalities are seen.  IMPRESSION: 1. Mild acute  subarachnoid hemorrhage again noted along the high left frontoparietal region, in a similar distribution to the recent prior study. The amount of subarachnoid hemorrhage is difficult to compare with prior MRI. 2. Otherwise unremarkable noncontrast CT of the head.  These results were called by telephone at the time of interpretation on 05/12/2014 at 12:15 am to [facility] PA, who verbally acknowledged these results.   Electronically Signed   By: Garald Balding M.D.   On: 05/12/2014 00:15    Review of Systems  HENT:        Onset headache over the past 10 days. Patient does not typically experience headaches in this experience has been clearly very different. Severity of the headaches has been "worse headache of her life "   Blood pressure 126/70, pulse 67, temperature 97.8 F (36.6 C), temperature source Oral, resp. rate 17, height '5\' 7"'  (1.702 m), weight 79.379 kg (175 lb), SpO2 95 %. Physical Exam  Constitutional: She is oriented to person, place, and time. She appears well-developed and well-nourished.  HENT:  Head: Normocephalic and atraumatic.  Eyes: Conjunctivae and EOM are normal. Pupils are equal, round, and reactive to light.  Neck: Normal range of motion. Neck supple.  Cardiovascular: Normal rate and regular rhythm.   Respiratory: Effort normal and breath sounds normal.  GI: Soft. Bowel sounds are normal.  Musculoskeletal: Normal range of motion.  Neurological: She is alert and oriented to person, place, and time. She has normal reflexes.  Skin: Skin is warm and dry.  Psychiatric: She has a normal mood and affect. Her behavior is normal. Judgment and thought content normal.    Assessment/Plan: Atypical presentation for subarachnoid aneurysmal bleeding with blood located towards the periphery however given the patient's history of the worst headache of her life on 3 separate occasions one has to entertain the possibility of a sentinel bleed. Will discuss the situation with Dr.  Orson Slick this morning. I believe she will require further investigation likely best done with a catheter angiogram.  [name] 05/12/2014, 3:24 AM

## 2014-05-12 NOTE — ED Notes (Signed)
MD at bedside. 

## 2014-05-12 NOTE — Progress Notes (Signed)
Patient Demographics  Vicki Hale, is a 64 y.o. female, DOB - [birth year], NID:[id number]  Admit date - 05/11/2014   Admitting Physician Etta Quill, DO  Outpatient Primary MD for the patient is Leonides Sake, MD  LOS - 0   Chief Complaint  Patient presents with  . Headache        Subjective:   Vicki Hale today has, , No chest pain, No abdominal pain - No Nausea, No new weakness tingling or numbness, No Cough - SOB. Mild right frontal headache.  Assessment & Plan    1. Left frontoparietal region subarachnoid hemorrhage with patient having right frontal headaches. Stable, no neurological deficits, headache much improved, neurosurgery on board and she is due for CT angiogram of the brain on 426 twice 16. Continue supportive care case discussed with neurosurgeon Dr. Nena Polio in detail. As needed hydralazine IV to keep systolic under 144. Stool softeners and coughs or percent ordered as needed. Tylenol for headache.    Code Status: Full  Family Communication: Family member bedside  Disposition Plan: Home   Procedures CT head and MRI brain. CT angiogram brain scheduled for 05/13/2014   Consults  N.Surg   Medications  Scheduled Meds: . docusate sodium  200 mg Oral BID   Continuous Infusions:  PRN Meds:.alum & mag hydroxide-simeth, guaiFENesin-dextromethorphan, hydrALAZINE  DVT Prophylaxis  SCDs   Lab Results  Component Value Date   PLT 285 05/11/2014    Antibiotics     Anti-infectives    None          Objective:   Filed Vitals:   05/12/14 0430 05/12/14 0445 05/12/14 0548 05/12/14 0726  BP: 108/58 109/62 127/59 93/56  Pulse: 53 57 56 67  Temp:   97.9 F (36.6 C) 98 F (36.7 C)  TempSrc:   Oral Oral  Resp: 11 11 11 20   Height:   5\' 7"  (1.702 m)     Weight:   78 kg (171 lb 15.3 oz)   SpO2: 94% 92% 92% 95%    Wt Readings from Last 3 Encounters:  05/12/14 78 kg (171 lb 15.3 oz)    No intake or output data in the 24 hours ending 05/12/14 0959   Physical Exam  Awake Alert, Oriented X 3, No new F.N deficits, Normal affect [place].AT,PERRAL Supple Neck,No JVD, No cervical lymphadenopathy appriciated.  Symmetrical Chest wall movement, Good air movement bilaterally, CTAB RRR,No Gallops,Rubs or new Murmurs, No Parasternal Heave +ve B.Sounds, Abd Soft, No tenderness, No organomegaly appriciated, No rebound - guarding or rigidity. No Cyanosis, Clubbing or edema, No new Rash or bruise      Data Review   Micro Results Recent Results (from the past 240 hour(s))  MRSA PCR Screening     Status: None   Collection Time: 05/12/14  6:32 AM  Result Value Ref Range Status   MRSA by PCR NEGATIVE NEGATIVE Final    Comment:        The GeneXpert MRSA Assay (FDA approved for NASAL specimens only), is one component of a comprehensive MRSA colonization surveillance program. It is not intended to diagnose MRSA infection nor to guide or monitor treatment for MRSA infections.     Radiology Reports Ct Head Wo Contrast  05/12/2014  CLINICAL DATA:  Acute onset of headache. Recent subarachnoid bleed. Initial encounter.  EXAM: CT HEAD WITHOUT CONTRAST  TECHNIQUE: Contiguous axial images were obtained from the base of the skull through the vertex without intravenous contrast.  COMPARISON:  MRI of the brain performed 05/09/2014  FINDINGS: Mild subarachnoid hemorrhage is again noted along the high left frontoparietal region, in a similar distribution to the prior study. No additional hematoma is seen. There is no evidence of mass lesion or acute infarction.  The posterior fossa, including the cerebellum, brainstem and fourth ventricle, is within normal limits. The third and lateral ventricles, and basal ganglia are unremarkable in appearance. No significant  mass effect or midline shift is seen.  There is no evidence of fracture; visualized osseous structures are unremarkable in appearance. The orbits are within normal limits. The paranasal sinuses and mastoid air cells are well-aerated. No significant soft tissue abnormalities are seen.  IMPRESSION: 1. Mild acute subarachnoid hemorrhage again noted along the high left frontoparietal region, in a similar distribution to the recent prior study. The amount of subarachnoid hemorrhage is difficult to compare with prior MRI. 2. Otherwise unremarkable noncontrast CT of the head.  These results were called by telephone at the time of interpretation on 05/12/2014 at 12:15 am to [facility] PA, who verbally acknowledged these results.   Electronically Signed   By: Garald Balding M.D.   On: 05/12/2014 00:15   Mr [name] ZD Contrast  05/09/2014   CLINICAL DATA:  64 year old female with headaches associated with visual changes, tremors, syncope, nausea and vomiting. By report, no recent fall or injury. Initial encounter. Personal history of ovarian cancer, in remission and no longer followed by oncology.  BUN and creatinine were obtained on site at [place] at  [address]  Results:  BUN 22 mg/dL,  Creatinine 0.9 mg/dL.  EXAM: MRI HEAD WITHOUT AND WITH CONTRAST  TECHNIQUE: Multiplanar, multiecho pulse sequences of the brain and surrounding structures were obtained without and with intravenous contrast.  CONTRAST:  46mL MULTIHANCE GADOBENATE DIMEGLUMINE 529 MG/ML IV SOLN  COMPARISON:  None.  FINDINGS: Abnormal subarachnoid signal along the left superior frontal gyrus and anterior convexity consistent with small volume subarachnoid hemorrhage (series 10, image 18, series 6, image 62). Mild associated diffusion artifact in that region. No superimposed parenchymal restricted diffusion to indicate infarct. No subarachnoid hemorrhage identified in the basilar cisterns. No intraventricular hemorrhage. No  associated abnormal enhancement. No overlying dural thickening or hyper enhancement identified.  No other intracranial blood products identified. No ventriculomegaly. No midline shift, mass effect, or evidence of intracranial mass lesion. Negative pituitary, cervicomedullary junction and visualized cervical spine. Major intracranial vascular flow voids are within normal limits, dominant appearing distal right vertebral artery. Pearline Cables and white matter signal is within normal limits for age throughout the brain.  Visible internal auditory structures appear normal. Trace mastoid fluid. Negative visualized nasopharynx. Trace paranasal sinus mucosal thickening. Visualized orbit soft tissues are within normal limits. Visualized scalp soft tissues are within normal limits. Normal bone marrow signal.  IMPRESSION: 1. Small volume of subarachnoid hemorrhage over the left superior frontal convexity. Etiology is unknown at this time, with no secondary findings of infarct, vascular malformation, venous thrombosis, tumor, etc. 2. Study discussed by telephone with Dr. Daiva Eves on 05/09/2014 at the time of this dictation. We discussed Neurosurgery consultation for cryptogenic subarachnoid hemorrhage.   Electronically Signed   By: Genevie Ann M.D.   On: 05/09/2014 15:27     CBC  Recent Labs Lab 05/11/14 2229  WBC 8.7  HGB 14.4  HCT 42.5  PLT 285  MCV 86.9  MCH 29.4  MCHC 33.9  RDW 12.9    Chemistries   Recent Labs Lab 05/11/14 2229  NA 133*  K 3.8  CL 101  CO2 20  GLUCOSE 139*  BUN 13  CREATININE 0.87  CALCIUM 9.4   ------------------------------------------------------------------------------------------------------------------ estimated creatinine clearance is 72.2 mL/min (by C-G formula based on Cr of 0.87). ------------------------------------------------------------------------------------------------------------------ No results for input(s): HGBA1C in the last 72  hours. ------------------------------------------------------------------------------------------------------------------ No results for input(s): CHOL, HDL, LDLCALC, TRIG, CHOLHDL, LDLDIRECT in the last 72 hours. ------------------------------------------------------------------------------------------------------------------ No results for input(s): TSH, T4TOTAL, T3FREE, THYROIDAB in the last 72 hours.  Invalid input(s): FREET3 ------------------------------------------------------------------------------------------------------------------ No results for input(s): VITAMINB12, FOLATE, FERRITIN, TIBC, IRON, RETICCTPCT in the last 72 hours.  Coagulation profile No results for input(s): INR, PROTIME in the last 168 hours.  No results for input(s): DDIMER in the last 72 hours.  Cardiac Enzymes No results for input(s): CKMB, TROPONINI, MYOGLOBIN in the last 168 hours.  Invalid input(s): CK ------------------------------------------------------------------------------------------------------------------ Invalid input(s): POCBNP     Time Spent in minutes   35   SINGH,PRASHANT K M.D on 05/12/2014 at 9:59 AM  Between 7am to 7pm - Pager - [phone]  After 7pm go to [email or web address] - password [facility]  [facility]   Office  [phone]

## 2014-05-12 NOTE — ED Notes (Signed)
Report attempted, RN to call back. 

## 2014-05-13 ENCOUNTER — Inpatient Hospital Stay (HOSPITAL_COMMUNITY): Payer: BC Managed Care – PPO

## 2014-05-13 LAB — TSH: TSH: 0.736 u[IU]/mL (ref 0.350–4.500)

## 2014-05-13 MED ORDER — ONDANSETRON HCL 4 MG/2ML IJ SOLN
4.0000 mg | Freq: Once | INTRAMUSCULAR | Status: AC
  Administered 2014-05-13: 4 mg via INTRAVENOUS
  Filled 2014-05-13: qty 2

## 2014-05-13 MED ORDER — IOHEXOL 300 MG/ML  SOLN
150.0000 mL | Freq: Once | INTRAMUSCULAR | Status: AC | PRN
  Administered 2014-05-13: 70 mL via INTRA_ARTERIAL

## 2014-05-13 MED ORDER — MORPHINE SULFATE 2 MG/ML IJ SOLN
2.0000 mg | Freq: Once | INTRAMUSCULAR | Status: AC
Start: 2014-05-13 — End: 2014-05-13
  Administered 2014-05-13: 2 mg via INTRAVENOUS

## 2014-05-13 MED ORDER — MIDAZOLAM HCL 2 MG/2ML IJ SOLN
INTRAMUSCULAR | Status: AC | PRN
  Administered 2014-05-13: 0.5 mg via INTRAVENOUS

## 2014-05-13 MED ORDER — FENTANYL CITRATE (PF) 100 MCG/2ML IJ SOLN
INTRAMUSCULAR | Status: AC | PRN
  Administered 2014-05-13: 25 ug via INTRAVENOUS

## 2014-05-13 MED ORDER — DIPHENHYDRAMINE HCL 50 MG/ML IJ SOLN: 12.5000 mg | Freq: Once | INTRAMUSCULAR | Status: DC

## 2014-05-13 MED ORDER — MIDAZOLAM HCL 2 MG/2ML IJ SOLN
INTRAMUSCULAR | Status: AC
  Filled 2014-05-13: qty 2

## 2014-05-13 MED ORDER — LIDOCAINE HCL 1 % IJ SOLN
INTRAMUSCULAR | Status: AC
  Filled 2014-05-13: qty 20

## 2014-05-13 MED ORDER — HYDROCODONE-ACETAMINOPHEN 5-325 MG PO TABS: 1.0000 | ORAL_TABLET | Freq: Once | ORAL | Status: DC

## 2014-05-13 MED ORDER — FENTANYL CITRATE (PF) 100 MCG/2ML IJ SOLN
INTRAMUSCULAR | Status: AC
  Filled 2014-05-13: qty 2

## 2014-05-13 MED ORDER — HYDROMORPHONE HCL 1 MG/ML IJ SOLN: 1.0000 mg | Freq: Once | INTRAMUSCULAR | Status: AC | PRN

## 2014-05-13 MED ORDER — SODIUM CHLORIDE 0.9 % IV SOLN
INTRAVENOUS | Status: AC | PRN
  Administered 2014-05-13: 10 mL/h via INTRAVENOUS

## 2014-05-13 MED ORDER — PROMETHAZINE HCL 25 MG/ML IJ SOLN
12.5000 mg | Freq: Four times a day (QID) | INTRAMUSCULAR | Status: DC | PRN
  Administered 2014-05-13 – 2014-05-15 (×2): 12.5 mg via INTRAVENOUS
  Filled 2014-05-13 (×2): qty 1

## 2014-05-13 MED ORDER — KETOROLAC TROMETHAMINE 30 MG/ML IJ SOLN
30.0000 mg | Freq: Four times a day (QID) | INTRAMUSCULAR | Status: DC | PRN
Start: 2014-05-13 — End: 2014-05-15
  Administered 2014-05-13 – 2014-05-15 (×4): 30 mg via INTRAVENOUS
  Filled 2014-05-13 (×4): qty 1

## 2014-05-13 NOTE — Progress Notes (Addendum)
Patient Demographics  Vicki Hale, is a 64 y.o. female, DOB - Nov 02, 1950, YKD:[id number]  Admit date - 05/11/2014   Admitting Physician Etta Quill, DO  Outpatient Primary MD for the patient is Leonides Sake, MD  LOS - 1   Chief Complaint  Patient presents with  . Headache        Subjective:   Vicki Hale today has, , No chest pain, No abdominal pain - No Nausea, No new weakness tingling or numbness, No Cough - SOB. No headache.  Assessment & Plan    1. Left frontoparietal region subarachnoid hemorrhage with patient having right frontal headaches. Stable, no neurological deficits, headache much improved, neurosurgery on board and she is due for Angiogram of the brain later on 05-13-14. Continue supportive care case discussed with neurosurgeon Dr. Nena Polio in detail. As needed hydralazine IV to keep systolic under 397. Stool softeners and coughs or percent ordered as needed. Tylenol PRN for headache.   Addendum - Post angiogram patient having headache + N&V, D/W Dr Kathyrn Sheriff OK for Toradol, NSAIDs - will do with supportive Rx. Repeat head CT unremarkable discussed with neurosurgery as well.   2. Few episodes of bradycardia in the high 30s noted without any symptoms or drop in blood pressure. These episodes seem to be related with nausea episodes likely due to vagal tone, Will check TSH and baseline EKG. Continue to monitor.      Code Status: Full  Family Communication: Husbnad bedside  Disposition Plan: Home   Procedures CT head and MRI brain. Angiogram brain scheduled for 05/13/2014   Consults  N.Surg   Medications  Scheduled Meds: . docusate sodium  200 mg Oral BID   Continuous Infusions:  PRN Meds:.acetaminophen, alum & mag hydroxide-simeth,  guaiFENesin-dextromethorphan, hydrALAZINE  DVT Prophylaxis  SCDs   Lab Results  Component Value Date   PLT 285 05/11/2014    Antibiotics     Anti-infectives    None          Objective:   Filed Vitals:   05/12/14 1905 05/12/14 2252 05/12/14 2256 05/13/14 0547  BP:   111/53 97/55  Pulse:   55 58  Temp: 98.1 F (36.7 C) 98.1 F (36.7 C)  98.1 F (36.7 C)  TempSrc: Oral Oral  Oral  Resp:   9 15  Height:      Weight:      SpO2:   95% 95%    Wt Readings from Last 3 Encounters:  05/12/14 78 kg (171 lb 15.3 oz)     Intake/Output Summary (Last 24 hours) at 05/13/14 1100 Last data filed at 05/12/14 2259  Gross per 24 hour  Intake   1080 ml  Output      0 ml  Net   1080 ml     Physical Exam  Awake Alert, Oriented X 3, No new F.N deficits, Normal affect [place].AT,PERRAL Supple Neck,No JVD, No cervical lymphadenopathy appriciated.  Symmetrical Chest wall movement, Good air movement bilaterally, CTAB RRR,No Gallops,Rubs or new Murmurs, No Parasternal Heave +ve B.Sounds, Abd Soft, No tenderness, No organomegaly appriciated, No rebound - guarding or rigidity. No Cyanosis, Clubbing or edema, No new Rash or bruise      Data Review   Micro Results Recent  Results (from the past 240 hour(s))  MRSA PCR Screening     Status: None   Collection Time: 05/12/14  6:32 AM  Result Value Ref Range Status   MRSA by PCR NEGATIVE NEGATIVE Final    Comment:        The GeneXpert MRSA Assay (FDA approved for NASAL specimens only), is one component of a comprehensive MRSA colonization surveillance program. It is not intended to diagnose MRSA infection nor to guide or monitor treatment for MRSA infections.     Radiology Reports Ct Head Wo Contrast  05/12/2014   CLINICAL DATA:  Acute onset of headache. Recent subarachnoid bleed. Initial encounter.  EXAM: CT HEAD WITHOUT CONTRAST  TECHNIQUE: Contiguous axial images were obtained from the base of the skull through the vertex  without intravenous contrast.  COMPARISON:  MRI of the brain performed 05/09/2014  FINDINGS: Mild subarachnoid hemorrhage is again noted along the high left frontoparietal region, in a similar distribution to the prior study. No additional hematoma is seen. There is no evidence of mass lesion or acute infarction.  The posterior fossa, including the cerebellum, brainstem and fourth ventricle, is within normal limits. The third and lateral ventricles, and basal ganglia are unremarkable in appearance. No significant mass effect or midline shift is seen.  There is no evidence of fracture; visualized osseous structures are unremarkable in appearance. The orbits are within normal limits. The paranasal sinuses and mastoid air cells are well-aerated. No significant soft tissue abnormalities are seen.  IMPRESSION: 1. Mild acute subarachnoid hemorrhage again noted along the high left frontoparietal region, in a similar distribution to the recent prior study. The amount of subarachnoid hemorrhage is difficult to compare with prior MRI. 2. Otherwise unremarkable noncontrast CT of the head.  These results were called by telephone at the time of interpretation on 05/12/2014 at 12:15 am to [facility] PA, who verbally acknowledged these results.   Electronically Signed   By: Garald Balding M.D.   On: 05/12/2014 00:15   Mr [name] KD Contrast  05/09/2014   CLINICAL DATA:  64 year old female with headaches associated with visual changes, tremors, syncope, nausea and vomiting. By report, no recent fall or injury. Initial encounter. Personal history of ovarian cancer, in remission and no longer followed by oncology.  BUN and creatinine were obtained on site at [place] at  [address]  Results:  BUN 22 mg/dL,  Creatinine 0.9 mg/dL.  EXAM: MRI HEAD WITHOUT AND WITH CONTRAST  TECHNIQUE: Multiplanar, multiecho pulse sequences of the brain and surrounding structures were obtained without and with intravenous  contrast.  CONTRAST:  38mL MULTIHANCE GADOBENATE DIMEGLUMINE 529 MG/ML IV SOLN  COMPARISON:  None.  FINDINGS: Abnormal subarachnoid signal along the left superior frontal gyrus and anterior convexity consistent with small volume subarachnoid hemorrhage (series 10, image 18, series 6, image 62). Mild associated diffusion artifact in that region. No superimposed parenchymal restricted diffusion to indicate infarct. No subarachnoid hemorrhage identified in the basilar cisterns. No intraventricular hemorrhage. No associated abnormal enhancement. No overlying dural thickening or hyper enhancement identified.  No other intracranial blood products identified. No ventriculomegaly. No midline shift, mass effect, or evidence of intracranial mass lesion. Negative pituitary, cervicomedullary junction and visualized cervical spine. Major intracranial vascular flow voids are within normal limits, dominant appearing distal right vertebral artery. Pearline Cables and white matter signal is within normal limits for age throughout the brain.  Visible internal auditory structures appear normal. Trace mastoid fluid. Negative visualized nasopharynx. Trace paranasal sinus  mucosal thickening. Visualized orbit soft tissues are within normal limits. Visualized scalp soft tissues are within normal limits. Normal bone marrow signal.  IMPRESSION: 1. Small volume of subarachnoid hemorrhage over the left superior frontal convexity. Etiology is unknown at this time, with no secondary findings of infarct, vascular malformation, venous thrombosis, tumor, etc. 2. Study discussed by telephone with Dr. Daiva Eves on 05/09/2014 at the time of this dictation. We discussed Neurosurgery consultation for cryptogenic subarachnoid hemorrhage.   Electronically Signed   By: Genevie Ann M.D.   On: 05/09/2014 15:27     CBC  Recent Labs Lab 05/11/14 2229  WBC 8.7  HGB 14.4  HCT 42.5  PLT 285  MCV 86.9  MCH 29.4  MCHC 33.9  RDW 12.9    Chemistries   Recent  Labs Lab 05/11/14 2229  NA 133*  K 3.8  CL 101  CO2 20  GLUCOSE 139*  BUN 13  CREATININE 0.87  CALCIUM 9.4   ------------------------------------------------------------------------------------------------------------------ estimated creatinine clearance is 72.2 mL/min (by C-G formula based on Cr of 0.87). ------------------------------------------------------------------------------------------------------------------ No results for input(s): HGBA1C in the last 72 hours. ------------------------------------------------------------------------------------------------------------------ No results for input(s): CHOL, HDL, LDLCALC, TRIG, CHOLHDL, LDLDIRECT in the last 72 hours. ------------------------------------------------------------------------------------------------------------------ No results for input(s): TSH, T4TOTAL, T3FREE, THYROIDAB in the last 72 hours.  Invalid input(s): FREET3 ------------------------------------------------------------------------------------------------------------------ No results for input(s): VITAMINB12, FOLATE, FERRITIN, TIBC, IRON, RETICCTPCT in the last 72 hours.  Coagulation profile No results for input(s): INR, PROTIME in the last 168 hours.  No results for input(s): DDIMER in the last 72 hours.  Cardiac Enzymes No results for input(s): CKMB, TROPONINI, MYOGLOBIN in the last 168 hours.  Invalid input(s): CK ------------------------------------------------------------------------------------------------------------------ Invalid input(s): POCBNP     Time Spent in minutes   35   SINGH,PRASHANT K M.D on 05/13/2014 at 11:00 AM  Between 7am to 7pm - Pager - [phone]  After 7pm go to [email or web address] - password [facility]  [facility]   Office  [phone]

## 2014-05-13 NOTE — Progress Notes (Signed)
PREOP DX:  1. Subarachnoid hemorrhage  POSTOP DX: Same  PROCEDURE: Diagnostic cerebral angiogram  SURGEON: Dr. Consuella Lose, MD  ANESTHESIA: IV Sedation with Local  EBL: Minimal  SPECIMENS: None  COMPLICATIONS: None  CONDITION: Stable to recovery  FINDINGS: 1. Subtle, beaded string appearance of the 3rd and 4th segments of bilateral ACA and MCA territories, perhaps most prominent on in the distal left MCA territory. This could represent intracranial atherosclerosis, or possibly the reversible cerebral vasoconstriction syndrome. 2. No aneurysms, AVM, or high-flow fistulas are seen

## 2014-05-14 NOTE — Progress Notes (Signed)
Patient Demographics  Vicki Hale, is a 64 y.o. female, DOB - February 25, 1950, IWP:[id number]  Admit date - 05/11/2014   Admitting Physician Etta Quill, DO  Outpatient Primary MD for the patient is Leonides Sake, MD  LOS - 2   Chief Complaint  Patient presents with  . Headache        Subjective:   Vicki Hale today has, , No chest pain, No abdominal pain - No Nausea, No new weakness tingling or numbness, No Cough - SOB. Mild headache.  Assessment & Plan    1. Left frontoparietal region subarachnoid hemorrhage with patient having right frontal headaches. Stable, no neurological deficits, headache much improved, neurosurgery on board and she is post Angiogram of the brain on 05-13-14. Post angiogram she developed headache which is gradually improving, discussed with neurosurgery, repeat CT head unremarkable. Per neurosurgery okay to give her Tylenol/tramadol for headaches, no further deficits. Headache gradually improving. Likely discharge in the morning.   2. Few episodes of bradycardia in the high 30s noted without any symptoms or drop in blood pressure. These episodes seem to be related with nausea episodes likely due to vagal tone, Stable TSH and baseline EKG. Continue to monitor.   Lab Results  Component Value Date   TSH 0.736 05/13/2014      Code Status: Full  Family Communication: Husbnad bedside  Disposition Plan: Home   Procedures CT head and MRI brain. Angiogram brain scheduled for 05/13/2014   Consults  N.Surg   Medications  Scheduled Meds: . diphenhydrAMINE  12.5 mg Intravenous Once  . docusate sodium  200 mg Oral BID  . HYDROcodone-acetaminophen  1 tablet Oral Once   Continuous Infusions:  PRN Meds:.acetaminophen, alum & mag hydroxide-simeth,  guaiFENesin-dextromethorphan, hydrALAZINE, ketorolac, promethazine  DVT Prophylaxis  SCDs   Lab Results  Component Value Date   PLT 285 05/11/2014    Antibiotics     Anti-infectives    None          Objective:   Filed Vitals:   05/14/14 0500 05/14/14 0700 05/14/14 0800 05/14/14 0900  BP: 107/78 116/59 101/68 105/52  Pulse: 45 54 58 54  Temp: 98 F (36.7 C)   98.1 F (36.7 C)  TempSrc: Oral     Resp: 14 13 13 12   Height:      Weight:      SpO2: 98% 100% 96% 97%    Wt Readings from Last 3 Encounters:  05/12/14 78 kg (171 lb 15.3 oz)     Intake/Output Summary (Last 24 hours) at 05/14/14 1142 Last data filed at 05/13/14 2253  Gross per 24 hour  Intake    120 ml  Output      0 ml  Net    120 ml     Physical Exam  Awake Alert, Oriented X 3, No new F.N deficits, Normal affect [place].AT,PERRAL Supple Neck,No JVD, No cervical lymphadenopathy appriciated.  Symmetrical Chest wall movement, Good air movement bilaterally, CTAB RRR,No Gallops,Rubs or new Murmurs, No Parasternal Heave +ve B.Sounds, Abd Soft, No tenderness, No organomegaly appriciated, No rebound - guarding or rigidity. No Cyanosis, Clubbing or edema, No new Rash or bruise      Data Review   Micro Results Recent Results (from the  past 240 hour(s))  MRSA PCR Screening     Status: None   Collection Time: 05/12/14  6:32 AM  Result Value Ref Range Status   MRSA by PCR NEGATIVE NEGATIVE Final    Comment:        The GeneXpert MRSA Assay (FDA approved for NASAL specimens only), is one component of a comprehensive MRSA colonization surveillance program. It is not intended to diagnose MRSA infection nor to guide or monitor treatment for MRSA infections.     Radiology Reports Ct Head Wo Contrast  05/13/2014   CLINICAL DATA:  Came in for subarachnoid hemorrhage and angiographic study. Worsening headache and nausea.  EXAM: CT HEAD WITHOUT CONTRAST  TECHNIQUE: Contiguous axial images were obtained  from the base of the skull through the vertex without intravenous contrast.  COMPARISON:  None.  FINDINGS: There is no evidence of mass effect or midline shift. There is no evidence of a space-occupying lesion or intraparenchymal. There is no evidence of a cortical-based area of acute infarction. There is a small amount of subarachnoid hemorrhage in the high left frontal lobe which is similar to the prior exam of 05/11/2014. There are no other foci of hemorrhage.  The ventricles and sulci are appropriate for the patient's age. The basal cisterns are patent.  Visualized portions of the orbits are unremarkable. The visualized portions of the paranasal sinuses and mastoid air cells are unremarkable.  The osseous structures are unremarkable.  IMPRESSION: 1. Stable small amount of subarachnoid hemorrhage in the high left frontal lobe similar in volume compared with 05/11/2014.   Electronically Signed   By: Kathreen Devoid   On: 05/13/2014 17:46   Ct Head Wo Contrast  05/12/2014   CLINICAL DATA:  Acute onset of headache. Recent subarachnoid bleed. Initial encounter.  EXAM: CT HEAD WITHOUT CONTRAST  TECHNIQUE: Contiguous axial images were obtained from the base of the skull through the vertex without intravenous contrast.  COMPARISON:  MRI of the brain performed 05/09/2014  FINDINGS: Mild subarachnoid hemorrhage is again noted along the high left frontoparietal region, in a similar distribution to the prior study. No additional hematoma is seen. There is no evidence of mass lesion or acute infarction.  The posterior fossa, including the cerebellum, brainstem and fourth ventricle, is within normal limits. The third and lateral ventricles, and basal ganglia are unremarkable in appearance. No significant mass effect or midline shift is seen.  There is no evidence of fracture; visualized osseous structures are unremarkable in appearance. The orbits are within normal limits. The paranasal sinuses and mastoid air cells are  well-aerated. No significant soft tissue abnormalities are seen.  IMPRESSION: 1. Mild acute subarachnoid hemorrhage again noted along the high left frontoparietal region, in a similar distribution to the recent prior study. The amount of subarachnoid hemorrhage is difficult to compare with prior MRI. 2. Otherwise unremarkable noncontrast CT of the head.  These results were called by telephone at the time of interpretation on 05/12/2014 at 12:15 am to [facility], who verbally acknowledged these results.   Electronically Signed   By: Garald Balding M.D.   On: 05/12/2014 00:15   Mr [name] SW Contrast  05/09/2014   CLINICAL DATA:  64 year old female with headaches associated with visual changes, tremors, syncope, nausea and vomiting. By report, no recent fall or injury. Initial encounter. Personal history of ovarian cancer, in remission and no longer followed by oncology.  BUN and creatinine were obtained on site at [place] at  [address]  Results:  BUN 22 mg/dL,  Creatinine 0.9 mg/dL.  EXAM: MRI HEAD WITHOUT AND WITH CONTRAST  TECHNIQUE: Multiplanar, multiecho pulse sequences of the brain and surrounding structures were obtained without and with intravenous contrast.  CONTRAST:  19mL MULTIHANCE GADOBENATE DIMEGLUMINE 529 MG/ML IV SOLN  COMPARISON:  None.  FINDINGS: Abnormal subarachnoid signal along the left superior frontal gyrus and anterior convexity consistent with small volume subarachnoid hemorrhage (series 10, image 18, series 6, image 62). Mild associated diffusion artifact in that region. No superimposed parenchymal restricted diffusion to indicate infarct. No subarachnoid hemorrhage identified in the basilar cisterns. No intraventricular hemorrhage. No associated abnormal enhancement. No overlying dural thickening or hyper enhancement identified.  No other intracranial blood products identified. No ventriculomegaly. No midline shift, mass effect, or evidence of intracranial  mass lesion. Negative pituitary, cervicomedullary junction and visualized cervical spine. Major intracranial vascular flow voids are within normal limits, dominant appearing distal right vertebral artery. [place] and white matter signal is within normal limits for age throughout the brain.  Visible internal auditory structures appear normal. Trace mastoid fluid. Negative visualized nasopharynx. Trace paranasal sinus mucosal thickening. Visualized orbit soft tissues are within normal limits. Visualized scalp soft tissues are within normal limits. Normal bone marrow signal.  IMPRESSION: 1. Small volume of subarachnoid hemorrhage over the left superior frontal convexity. Etiology is unknown at this time, with no secondary findings of infarct, vascular malformation, venous thrombosis, tumor, etc. 2. Study discussed by telephone with Dr. Daiva Eves on 05/09/2014 at the time of this dictation. We discussed Neurosurgery consultation for cryptogenic subarachnoid hemorrhage.   Electronically Signed   By: Genevie Ann M.D.   On: 05/09/2014 15:27     CBC  Recent Labs Lab 05/11/14 2229  WBC 8.7  HGB 14.4  HCT 42.5  PLT 285  MCV 86.9  MCH 29.4  MCHC 33.9  RDW 12.9    Chemistries   Recent Labs Lab 05/11/14 2229  NA 133*  K 3.8  CL 101  CO2 20  GLUCOSE 139*  BUN 13  CREATININE 0.87  CALCIUM 9.4   ------------------------------------------------------------------------------------------------------------------ estimated creatinine clearance is 72.2 mL/min (by C-G formula based on Cr of 0.87). ------------------------------------------------------------------------------------------------------------------ No results for input(s): HGBA1C in the last 72 hours. ------------------------------------------------------------------------------------------------------------------ No results for input(s): CHOL, HDL, LDLCALC, TRIG, CHOLHDL, LDLDIRECT in the last 72  hours. ------------------------------------------------------------------------------------------------------------------  Recent Labs  05/13/14 1918  TSH 0.736   ------------------------------------------------------------------------------------------------------------------ No results for input(s): VITAMINB12, FOLATE, FERRITIN, TIBC, IRON, RETICCTPCT in the last 72 hours.  Coagulation profile No results for input(s): INR, PROTIME in the last 168 hours.  No results for input(s): DDIMER in the last 72 hours.  Cardiac Enzymes No results for input(s): CKMB, TROPONINI, MYOGLOBIN in the last 168 hours.  Invalid input(s): CK ------------------------------------------------------------------------------------------------------------------ Invalid input(s): POCBNP   Time Spent in minutes   35   SINGH,PRASHANT K M.D on 05/14/2014 at 11:42 AM  Between 7am to 7pm - Pager - [phone]  After 7pm go to [email or web address] - password [facility]  [facility]   Office  [phone]

## 2014-05-15 ENCOUNTER — Inpatient Hospital Stay (HOSPITAL_COMMUNITY): Payer: BC Managed Care – PPO

## 2014-05-15 DIAGNOSIS — H539 Unspecified visual disturbance: Secondary | ICD-10-CM

## 2014-05-15 MED ORDER — PROMETHAZINE HCL 25 MG RE SUPP: 25.0000 mg | Freq: Four times a day (QID) | RECTAL | Status: DC | PRN

## 2014-05-15 MED ORDER — IBUPROFEN 600 MG PO TABS: 600.0000 mg | ORAL_TABLET | Freq: Three times a day (TID) | ORAL | Status: DC | PRN

## 2014-05-15 NOTE — Progress Notes (Signed)
EEG completed, results pending. 

## 2014-05-15 NOTE — Progress Notes (Signed)
VASCULAR LAB PRELIMINARY  PRELIMINARY  PRELIMINARY  PRELIMINARY  Transcranial Doppler  Date POD PCO2 HCT BP  MCA ACA PCA OPHT SIPH VERT Basilar  4-28- 16 SB     Right  Left   66  60   -50  91   -20  32   15  19   64  53   -25  -32     -48         Right  Left                                            Right  Left                                             Right  Left                                             Right  Left                                            Right  Left                                            Right  Left                                        MCA = Middle Cerebral Artery      OPHT = Opthalmic Artery     BASILAR = Basilar Artery   ACA = Anterior Cerebral Artery     SIPH = Carotid Siphon PCA = Posterior Cerebral Artery   VERT = Verterbral Artery                   Normal MCA = 62+\-12 ACA = 50+\-12 PCA = 42+\-23         Sugar Vanzandt, RVT 05/15/2014, 1:23 PM

## 2014-05-15 NOTE — Procedures (Signed)
EEG report.  Brief clinical history:  64 y/o with eft frontoparietal region subarachnoid hemorrhage with patient having right frontal headaches. Stable, no neurological deficits, headache much improved, neurosurgery on board and she is post Angiogram of the brain on 05-13-14. Post angiogram she developed headache which is gradually improving,  Technique: this is a 17 channel routine scalp EEG performed at the bedside with bipolar and monopolar montages arranged in accordance to the international 10/20 system of electrode placement. One channel was dedicated to EKG recording.  The study was performed during wakefulness and drowsiness. No activating procedures performed.  Description:In the wakeful state, the best background consisted of a low amplitude, posterior dominant, well sustained, symmetric and reactive 12 Hz rhythm. Drowsiness demonstrated dropout of the alpha rhythm. No focal or generalized epileptiform discharges noted.  No pathologic areas of slowing seen.  EKG showed sinus rhythm.  Impression: this is a normal awake and drowsy EEG. Please, be aware that a normal EEG does not exclude the possibility of epilepsy.  Clinical correlation is advised.   Dorian Pod, MD [facility]

## 2014-05-15 NOTE — Discharge Summary (Signed)
Vicki Hale, is a 64 y.o. female  DOB 09-23-1950  MRN [id number].  Admission date:  05/11/2014  Admitting Physician  Etta Quill, DO  Discharge Date:  05/15/2014   Primary MD  Leonides Sake, MD  Recommendations for primary care physician for things to follow:   Outpt follow with below recommended subspecialists  Follow final Trans Cranial Doppler report.    Admission Diagnosis  Bleeding   Discharge Diagnosis  Bleeding     Principal Problem:   SAH (subarachnoid hemorrhage)      Past Medical History  Diagnosis Date  . Ovarian cancer     Past Surgical History  Procedure Laterality Date  . Tonsillectomy    . Hernia repair         History of present illness and  Hospital Course:     Kindly see H&P for history of present illness and admission details, please review complete Labs, Consult reports and Test reports for all details in brief  HPI  from the history and physical done on the day of admission  Vicki Hale is a 64 y.o. female who developed sudden onset of severe headache 10 days ago. This was so severe that it caused her to drop to her knees she says. Very severe for several hours but then gradually resolved. Had 2nd episode a couple days later and saw PCP. PCP ordered MRI 4 days ago. MRI was performed 2 days ago and showed SAH. PCP wanted angiogram apparently but this was apparently rejected by insurance. Patient now presents to ED with 3rd episode of severe headache.  Hospital Course   1. Left frontoparietal region subarachnoid hemorrhage with patient having right frontal headaches. Stable, no neurological deficits, headache much improved, neurosurgery on board and she is post Angiogram of the brain on 05-13-14. Post angiogram she developed headache which is gradually  improving, discussed with neurosurgery, repeat CT head unremarkable. Per neurosurgery okay to give her Tylenol/tramadol for headaches, no further deficits. Headache gradually improving. Seen by Neuro as well, TCD stable.   2. Few episodes of bradycardia in the high 30s noted without any symptoms or drop in blood pressure. These episodes seem to be related with nausea episodes likely due to vagal tone, Stable TSH and baseline EKG.     3.Blurred vision - ? Etilogy, CT x 2 stable, EEG -ve, TCD stable ( per Dr Erlinda Hong), Seen by Neuro this am and cleared for DC home with outpt follow with Neuro and Opth.     Discharge Condition: Stable   Follow UP  Follow-up Information    Follow up with [facility], MD. Schedule an appointment as soon as possible for a visit in 1 week.   Specialty:  Family Medicine   Contact information:   Dr. Daiva Eves [address] [place] [address] [phone]       Follow up with Consuella Lose, C, MD. Schedule an appointment as soon as possible for a visit in 2 weeks.   Specialty:  Neurosurgery  Contact information:   [address] [place] [address] [phone]       Follow up with Xu,Jindong, MD. Schedule an appointment as soon as possible for a visit in 3 weeks.   Specialty:  Neurology   Why:  Stroke   Contact information:   [address] [place]  [address] [phone]       Follow up with Vevelyn Royals, MD. Schedule an appointment as soon as possible for a visit in 1 week.   Specialty:  Ophthalmology   Why:  blurred vision   Contact information:   [address] [facility] [id number] [facility] [place] [address] [phone]         Discharge Instructions  and  Discharge Medications      Discharge Instructions    Diet - low sodium heart healthy    Complete by:  As directed      Discharge instructions    Complete by:  As directed   Follow with  Primary MD HAMRICK,MAURA L, MD in 7 days   Get CBC, CMP, 2 view Chest X ray checked  by Primary MD next visit.    Activity: As tolerated with Full fall precautions use walker/cane & assistance as needed   Disposition Home     Diet: Heart Healthy    For Heart failure patients - Check your Weight same time everyday, if you gain over 2 pounds, or you develop in leg swelling, experience more shortness of breath or chest pain, call your Primary MD immediately. Follow Cardiac Low Salt Diet and 1.5 lit/day fluid restriction.   On your next visit with your primary care physician please Get Medicines reviewed and adjusted.   Please request your Prim.MD to go over all Hospital Tests and Procedure/Radiological results at the follow up, please get all Hospital records sent to your Prim MD by signing hospital release before you go home.   If you experience worsening of your admission symptoms, develop shortness of breath, life threatening emergency, suicidal or homicidal thoughts you must seek medical attention immediately by calling 911 or calling your MD immediately  if symptoms less severe.  You Must read complete instructions/literature along with all the possible adverse reactions/side effects for all the Medicines you take and that have been prescribed to you. Take any new Medicines after you have completely understood and accpet all the possible adverse reactions/side effects.   Do not drive, operating heavy machinery, until you have seen by a Neurologist and advised to do so again.  Do not drive when taking Pain medications.    Do not take more than prescribed Pain, Sleep and Anxiety Medications  Special Instructions: If you have smoked or chewed Tobacco  in the last 2 yrs please stop smoking, stop any regular Alcohol  and or any Recreational drug use.  Wear Seat belts while driving.   Please note  You were cared for by a hospitalist during your hospital stay. If you have any  questions about your discharge medications or the care you received while you were in the hospital after you are discharged, you can call the unit and asked to speak with the hospitalist on call if the hospitalist that took care of you is not available. Once you are discharged, your primary care physician will handle any further medical issues. Please note that NO REFILLS for any discharge medications will be authorized once you are discharged, as it is imperative that you return to your primary  care physician (or establish a relationship with a primary care physician if you do not have one) for your aftercare needs so that they can reassess your need for medications and monitor your lab values.     Increase activity slowly    Complete by:  As directed             Medication List    STOP taking these medications        ADVIL ALLERGY SINUS PO     Oxycodone HCl 10 MG Tabs      TAKE these medications        acetaminophen 500 MG tablet  Commonly known as:  TYLENOL  Take 500 mg by mouth every 6 (six) hours as needed for headache.     ibuprofen 600 MG tablet  Commonly known as:  ADVIL,MOTRIN  Take 1 tablet (600 mg total) by mouth every 8 (eight) hours as needed.     promethazine 25 MG suppository  Commonly known as:  PHENERGAN  Place 1 suppository (25 mg total) rectally every 6 (six) hours as needed for nausea or vomiting.          Diet and Activity recommendation: See Discharge Instructions above   Consults obtained - N Surg, Neurology   Major procedures and Radiology Reports - PLEASE review detailed and final reports for all details, in brief -   EEG  Impression: this is a normal awake and drowsy EEG. Please, be aware that a normal EEG does not exclude the possibility of epilepsy.  Clinical correlation is advised.   TCD - Final pending Per Xu stable for DC     Ct Head Wo Contrast  05/13/2014   CLINICAL DATA:  Came in for subarachnoid hemorrhage and angiographic  study. Worsening headache and nausea.  EXAM: CT HEAD WITHOUT CONTRAST  TECHNIQUE: Contiguous axial images were obtained from the base of the skull through the vertex without intravenous contrast.  COMPARISON:  None.  FINDINGS: There is no evidence of mass effect or midline shift. There is no evidence of a space-occupying lesion or intraparenchymal. There is no evidence of a cortical-based area of acute infarction. There is a small amount of subarachnoid hemorrhage in the high left frontal lobe which is similar to the prior exam of 05/11/2014. There are no other foci of hemorrhage.  The ventricles and sulci are appropriate for the patient's age. The basal cisterns are patent.  Visualized portions of the orbits are unremarkable. The visualized portions of the paranasal sinuses and mastoid air cells are unremarkable.  The osseous structures are unremarkable.  IMPRESSION: 1. Stable small amount of subarachnoid hemorrhage in the high left frontal lobe similar in volume compared with 05/11/2014.   Electronically Signed   By: Kathreen Devoid   On: 05/13/2014 17:46   Ct Head Wo Contrast  05/12/2014   CLINICAL DATA:  Acute onset of headache. Recent subarachnoid bleed. Initial encounter.  EXAM: CT HEAD WITHOUT CONTRAST  TECHNIQUE: Contiguous axial images were obtained from the base of the skull through the vertex without intravenous contrast.  COMPARISON:  MRI of the brain performed 05/09/2014  FINDINGS: Mild subarachnoid hemorrhage is again noted along the high left frontoparietal region, in a similar distribution to the prior study. No additional hematoma is seen. There is no evidence of mass lesion or acute infarction.  The posterior fossa, including the cerebellum, brainstem and fourth ventricle, is within normal limits. The third and lateral ventricles, and basal ganglia are unremarkable in appearance. No significant  mass effect or midline shift is seen.  There is no evidence of fracture; visualized osseous structures  are unremarkable in appearance. The orbits are within normal limits. The paranasal sinuses and mastoid air cells are well-aerated. No significant soft tissue abnormalities are seen.  IMPRESSION: 1. Mild acute subarachnoid hemorrhage again noted along the high left frontoparietal region, in a similar distribution to the recent prior study. The amount of subarachnoid hemorrhage is difficult to compare with prior MRI. 2. Otherwise unremarkable noncontrast CT of the head.  These results were called by telephone at the time of interpretation on 05/12/2014 at 12:15 am to [facility] PA, who verbally acknowledged these results.   Electronically Signed   By: Garald Balding M.D.   On: 05/12/2014 00:15   Mr [name] PR Contrast  05/09/2014   CLINICAL DATA:  64 year old female with headaches associated with visual changes, tremors, syncope, nausea and vomiting. By report, no recent fall or injury. Initial encounter. Personal history of ovarian cancer, in remission and no longer followed by oncology.  BUN and creatinine were obtained on site at [facility] at  [address]  Results:  BUN 22 mg/dL,  Creatinine 0.9 mg/dL.  EXAM: MRI HEAD WITHOUT AND WITH CONTRAST  TECHNIQUE: Multiplanar, multiecho pulse sequences of the brain and surrounding structures were obtained without and with intravenous contrast.  CONTRAST:  61mL MULTIHANCE GADOBENATE DIMEGLUMINE 529 MG/ML IV SOLN  COMPARISON:  None.  FINDINGS: Abnormal subarachnoid signal along the left superior frontal gyrus and anterior convexity consistent with small volume subarachnoid hemorrhage (series 10, image 18, series 6, image 62). Mild associated diffusion artifact in that region. No superimposed parenchymal restricted diffusion to indicate infarct. No subarachnoid hemorrhage identified in the basilar cisterns. No intraventricular hemorrhage. No associated abnormal enhancement. No overlying dural thickening or hyper enhancement identified.  No other  intracranial blood products identified. No ventriculomegaly. No midline shift, mass effect, or evidence of intracranial mass lesion. Negative pituitary, cervicomedullary junction and visualized cervical spine. Major intracranial vascular flow voids are within normal limits, dominant appearing distal right vertebral artery. Pearline Cables and white matter signal is within normal limits for age throughout the brain.  Visible internal auditory structures appear normal. Trace mastoid fluid. Negative visualized nasopharynx. Trace paranasal sinus mucosal thickening. Visualized orbit soft tissues are within normal limits. Visualized scalp soft tissues are within normal limits. Normal bone marrow signal.  IMPRESSION: 1. Small volume of subarachnoid hemorrhage over the left superior frontal convexity. Etiology is unknown at this time, with no secondary findings of infarct, vascular malformation, venous thrombosis, tumor, etc. 2. Study discussed by telephone with Dr. Daiva Eves on 05/09/2014 at the time of this dictation. We discussed Neurosurgery consultation for cryptogenic subarachnoid hemorrhage.   Electronically Signed   By: Genevie Ann M.D.   On: 05/09/2014 15:27    Micro Results      Recent Results (from the past 240 hour(s))  MRSA PCR Screening     Status: None   Collection Time: 05/12/14  6:32 AM  Result Value Ref Range Status   MRSA by PCR NEGATIVE NEGATIVE Final    Comment:        The GeneXpert MRSA Assay (FDA approved for NASAL specimens only), is one component of a comprehensive MRSA colonization surveillance program. It is not intended to diagnose MRSA infection nor to guide or monitor treatment for MRSA infections.        Today   Subjective:   Vicki Hale today has no headache, mild  blurred vision at times, no chest abdominal pain,no new weakness tingling or numbness, feels much better wants to go home today.    Objective:   Blood pressure 117/58, pulse 55, temperature 97.8 F (36.6  C), temperature source Oral, resp. rate 15, height 5\' 7"  (1.702 m), weight 78 kg (171 lb 15.3 oz), SpO2 98 %.   Intake/Output Summary (Last 24 hours) at 05/15/14 1308 Last data filed at 05/15/14 0520  Gross per 24 hour  Intake    480 ml  Output      0 ml  Net    480 ml    Exam Awake Alert, Oriented x 3, No new F.N deficits, Normal affect [place].AT,PERRAL Supple Neck,No JVD, No cervical lymphadenopathy appriciated.  Symmetrical Chest wall movement, Good air movement bilaterally, CTAB RRR,No Gallops,Rubs or new Murmurs, No Parasternal Heave +ve B.Sounds, Abd Soft, Non tender, No organomegaly appriciated, No rebound -guarding or rigidity. No Cyanosis, Clubbing or edema, No new Rash or bruise  Data Review   CBC w Diff: Lab Results  Component Value Date   WBC 8.7 05/11/2014   HGB 14.4 05/11/2014   HCT 42.5 05/11/2014   PLT 285 05/11/2014    CMP: Lab Results  Component Value Date   NA 133* 05/11/2014   K 3.8 05/11/2014   CL 101 05/11/2014   CO2 20 05/11/2014   BUN 13 05/11/2014   CREATININE 0.87 05/11/2014  .   Total Time in preparing paper work, data evaluation and todays exam - 35 minutes  Thurnell Lose M.D on 05/15/2014 at [facility]   Office  [phone]

## 2014-05-15 NOTE — Consult Note (Addendum)
Referring Physician: Dr. Shepard General    Chief Complaint: visual changes  HPI: Vicki Hale is an 64 y.o. female without significant medical history was admitted on 05/12/2014 for headache. She had a severe headache 10 days prior to admission, but gradually resolved. About 8 days prior to admission she had a severe headache again which promoted her to see her PCP. MRI was done showed left frontal subarachnoid hemorrhage. Patient had surgery episode of severe headache on the day of admission. Neurosurgery Dr. Kathyrn Sheriff was consulted, performed DSA which did not show any aneurysm but distal vessel mild irregularity. During the admission, patient had a gradually getting better. However, she complains since yesterday she started to have some visual changes. She stated that when she keep staring on the object, the image will disappear for 0.5 sec and come back on eye blinking or eye movement. When I turned on all the light in her room and asked her to focus on the objects without blinking or move, the visual changes were gone. She had EEG and TCD done today which were all unremarkable. She was advised to see her ophthalmologist after discharge.  Past Medical History  Diagnosis Date  . Ovarian cancer     Past Surgical History  Procedure Laterality Date  . Tonsillectomy    . Hernia repair     Family history include the mom has mild dementia.  Social History:  reports that she has never smoked. She does not have any smokeless tobacco history on file. She reports that she drinks alcohol. She reports that she does not use illicit drugs. she is taking care of her 55-month-old grandson at home.  Allergies:  Allergies  Allergen Reactions  . Penicillins Rash    Medications:  Current facility-administered medications:  .  acetaminophen (TYLENOL) tablet 650 mg, 650 mg, Oral, Q6H PRN, Thurnell Lose, MD, 650 mg at 05/15/14 0134 .  alum & mag hydroxide-simeth (MAALOX/MYLANTA) 200-200-20 MG/5ML  suspension 30 mL, 30 mL, Oral, Q6H PRN, Thurnell Lose, MD .  diphenhydrAMINE (BENADRYL) injection 12.5 mg, 12.5 mg, Intravenous, Once, Thurnell Lose, MD .  docusate sodium (COLACE) capsule 200 mg, 200 mg, Oral, BID, Thurnell Lose, MD, 200 mg at 05/15/14 0958 .  guaiFENesin-dextromethorphan (ROBITUSSIN DM) 100-10 MG/5ML syrup 5 mL, 5 mL, Oral, Q4H PRN, Thurnell Lose, MD .  hydrALAZINE (APRESOLINE) injection 10 mg, 10 mg, Intravenous, Q6H PRN, Thurnell Lose, MD .  HYDROcodone-acetaminophen (NORCO/VICODIN) 5-325 MG per tablet 1 tablet, 1 tablet, Oral, Once, Thurnell Lose, MD .  ketorolac (TORADOL) 30 MG/ML injection 30 mg, 30 mg, Intravenous, Q6H PRN, Thurnell Lose, MD, 30 mg at 05/15/14 0240 .  promethazine (PHENERGAN) injection 12.5 mg, 12.5 mg, Intravenous, Q6H PRN, Thurnell Lose, MD, 12.5 mg at 05/15/14 0240  Current outpatient prescriptions:  .  acetaminophen (TYLENOL) 500 MG tablet, Take 500 mg by mouth every 6 (six) hours as needed for headache., Disp: , Rfl:  .  ibuprofen (ADVIL,MOTRIN) 600 MG tablet, Take 1 tablet (600 mg total) by mouth every 8 (eight) hours as needed., Disp: 15 tablet, Rfl: 0 .  promethazine (PHENERGAN) 25 MG suppository, Place 1 suppository (25 mg total) rectally every 6 (six) hours as needed for nausea or vomiting., Disp: 12 each, Rfl: 0   ROS: Headache, visual changes  Physical Examination:  Temp:  [97.8 F (36.6 C)-99 F (37.2 C)] 97.8 F (36.6 C) (04/28 0738) Pulse Rate:  [55-60] 55 (04/28 0517) Resp:  [12-15] 15 (  04/28 0517) BP: (100-117)/(43-76) 117/58 mmHg (04/28 0738) SpO2:  [97 %-99 %] 98 % (04/28 0517)  General - Well nourished, well developed, in no apparent distress.  Ophthalmologic - fundi not visualized due to eye movement.  Cardiovascular - Regular rate and rhythm with no murmur.  Mental Status -  Level of arousal and orientation to time, place, and person were intact. Language including expression, naming,  repetition, comprehension was assessed and found intact. Attention span and concentration were normal. Recent and remote memory were intact. Fund of Knowledge was assessed and was intact.  Cranial Nerves II - XII - II - Visual field intact OU. III, IV, VI - Extraocular movements intact. V - Facial sensation intact bilaterally. VII - Facial movement intact bilaterally. VIII - Hearing & vestibular intact bilaterally. X - Palate elevates symmetrically. XI - Chin turning & shoulder shrug intact bilaterally. XII - Tongue protrusion intact.  Motor Strength - The patient's strength was normal in all extremities and pronator drift was absent.  Bulk was normal and fasciculations were absent.   Motor Tone - Muscle tone was assessed at the neck and appendages and was normal.  Reflexes - The patient's reflexes were 1+ in all extremities and she had no pathological reflexes.  Sensory - Light touch, temperature/pinprick, vibration and proprioception, and Romberg testing were assessed and were symmetrical.    Coordination - The patient had normal movements in the hands and feet with no ataxia or dysmetria.  Tremor was absent.  Gait and Station - The patient's transfers, posture, gait, station, and turns were observed as normal.   Results for orders placed or performed during the hospital encounter of 05/11/14 (from the past 48 hour(s))  TSH     Status: None   Collection Time: 05/13/14  7:18 PM  Result Value Ref Range   TSH 0.736 0.350 - 4.500 uIU/mL   Ct Head Wo Contrast  05/13/2014   IMPRESSION: 1. Stable small amount of subarachnoid hemorrhage in the high left frontal lobe similar in volume compared with 05/11/2014.      05/12/2014   IMPRESSION: 1. Mild acute subarachnoid hemorrhage again noted along the high left frontoparietal region, in a similar distribution to the recent prior study. The amount of subarachnoid hemorrhage is difficult to compare with prior MRI. 2. Otherwise unremarkable  noncontrast CT of the head.     Mr [name] Wo Contrast  05/09/2014   IMPRESSION: 1. Small volume of subarachnoid hemorrhage over the left superior frontal convexity. Etiology is unknown at this time, with no secondary findings of infarct, vascular malformation, venous thrombosis, tumor, etc.   DSA - 1. Subtle, beaded string appearance of the 3rd and 4th segments of bilateral ACA and MCA territories, perhaps most prominent on in the distal left MCA territory. This could represent intracranial atherosclerosis, or possibly the reversible cerebral vasoconstriction syndrome. 2. No aneurysms, AVM, or high-flow fistulas are seen   EEG - this is a normal awake and drowsy EEG. Please, be aware that a normal EEG does not exclude the possibility of epilepsy. Clinical correlation is advised.   TCD - no vasospasm.   Assessment: 64 y.o. female with no significant medical history presented with left frontal SAH. CT repeat shows SAH stable. DSA showed no aneurysm, AVM, but mild irregularity at the distal vessels. Etiology not clear at this moment, may consider RCVS, CAA, vasculitis, venous angioma. EEG negative and TCD no vasospasm. May consider outpatient autoimmune workup and the repeat MRI with contrast in about 2  months.  Plan: - Continue symptomatic treatment - Repeat MRI with contrast in about 2 months - Follow up in clinic in about 2 months - Outpatient autoimmune workup - May consider lumbar puncture after SAH resolved - Check blood pressure at home - Avoid blood pressure fluctuation - Can be discharged from neurologist and point  Rosalin Hawking, MD PhD Stroke Neurology 05/15/2014 3:59 PM   ADDENDUM: Pt was evaluated before discharge. She is neurologically intact and no focal neurological deficit seen. At this point, I did not see any rehab needs. She is going to be discharged home and follow up in clinic.   Rosalin Hawking, MD PhD Stroke Neurology 06/05/2014 6:23 PM

## 2014-05-15 NOTE — Discharge Instructions (Signed)
Follow with Primary MD HAMRICK,MAURA L, MD in 7 days   Get CBC, CMP, 2 view Chest X ray checked  by Primary MD next visit.    Activity: As tolerated with Full fall precautions use walker/cane & assistance as needed   Disposition Home     Diet: Heart Healthy    For Heart failure patients - Check your Weight same time everyday, if you gain over 2 pounds, or you develop in leg swelling, experience more shortness of breath or chest pain, call your Primary MD immediately. Follow Cardiac Low Salt Diet and 1.5 lit/day fluid restriction.   On your next visit with your primary care physician please Get Medicines reviewed and adjusted.   Please request your Prim.MD to go over all Hospital Tests and Procedure/Radiological results at the follow up, please get all Hospital records sent to your Prim MD by signing hospital release before you go home.   If you experience worsening of your admission symptoms, develop shortness of breath, life threatening emergency, suicidal or homicidal thoughts you must seek medical attention immediately by calling 911 or calling your MD immediately  if symptoms less severe.  You Must read complete instructions/literature along with all the possible adverse reactions/side effects for all the Medicines you take and that have been prescribed to you. Take any new Medicines after you have completely understood and accpet all the possible adverse reactions/side effects.   Do not drive, operating heavy machinery, until you have seen by a Neurologist and advised to do so again.  Do not drive when taking Pain medications.    Do not take more than prescribed Pain, Sleep and Anxiety Medications  Special Instructions: If you have smoked or chewed Tobacco  in the last 2 yrs please stop smoking, stop any regular Alcohol  and or any Recreational drug use.  Wear Seat belts while driving.   Please note  You were cared for by a hospitalist during your hospital stay. If you  have any questions about your discharge medications or the care you received while you were in the hospital after you are discharged, you can call the unit and asked to speak with the hospitalist on call if the hospitalist that took care of you is not available. Once you are discharged, your primary care physician will handle any further medical issues. Please note that NO REFILLS for any discharge medications will be authorized once you are discharged, as it is imperative that you return to your primary care physician (or establish a relationship with a primary care physician if you do not have one) for your aftercare needs so that they can reassess your need for medications and monitor your lab values.

## 2014-06-05 ENCOUNTER — Encounter: Payer: Self-pay | Admitting: Internal Medicine

## 2014-06-28 NOTE — Op Note (Signed)
DIAGNOSTIC CEREBRAL ANGIOGRAM    OPERATOR:   Dr. Consuella Lose, MD  HISTORY:   The patient is a 64 y.o. yo female Initially presenting with multiple episodes of thunderclap headache, and CT scan demonstrating slight convexity subarachnoid hemorrhage.  Diagnostic cerebral angiogram was therefore requested for further workup.  APPROACH:   The technical aspects of the procedure as well as its potential risks and benefits were reviewed with the patient. These risks included but were not limited bleeding, infection, allergic reaction, damage to organs/vital structures, stroke, non-diagnostic procedure, and the catastrophic outcomes of heart attack, coma, and death. With an understanding of these risks, informed consent was obtained and witnessed.    The patient was placed in the supine position on the angiography table and the skin of right groin prepped in the usual sterile fashion. The procedure was performed under local anesthesia (1%-solution of bicarbonate-bufferred Lidoacaine) and conscious sedation with Versed and fentanyl monitored by the in-suite nurse.    A 5- French sheath was introduced in the right common femoral artery using Seldinger technique.  A fluorophase sequence was used to document the sheath position.    HEPARIN: 0 Units total.   CONTRAST AGENT: 60cc, Omnipaque 300   FLUOROSCOPY TIME: 3.3 combined AP and lateral minutes    CATHETER(S) AND WIRE(S):    5-French JB-1 glidecatheter   0.035" glidewire    VESSELS CATHETERIZED:   Right common carotid   Left common carotid   Right vertebral   Left vertebral   Right common femoral  VESSELS STUDIED:   Right internal carotid, head Right vertebral Left internal carotid, head Left vertebral Right femoral  PROCEDURAL NARRATIVE:   A 5-Fr JB-1 terumo glide catheter was advanced over a 0.035 glidewire into the aortic arch. The above vessels were then sequentially catheterized and cervical/cerebral angiograms taken. After  review of images, the catheter was removed without incident.    INTERPRETATION:   Right internal carotid: head:   Injection reveals the presence of a widely patent ICA, M1, and A1 segments and their branches.There is a beaded string appearance to multiple branches of both the distal anterior cerebral and middle cerebral arteries.  This is best seen on lateral angiograms. The parenchymal and venous phases are normal. The venous sinuses are widely patent.    Left internal carotid: head:   Injection reveals the presence of a widely patent ICA, A1, and M1 segments and their branches. Again demonstrated is a beaded string appearance of the distal segments of the anterior cerebral and middle cerebral arteries. The parenchymal and venous phases are normal. The venous sinuses are widely patent.    Left vertebral:   Injection reveals the presence of a widely patent vertebral artery. This leads to a widely patent basilar artery that terminates in bilateral P1. The basilar apex is normal. There is no significant stenosis, occlusion, aneurysm, or vascular malformation visualized. The parenchymal and venous phases are normal. The venous sinuses are widely patent.    Right vertebral:    The left vertebral artery is nondominant, and does demonstrate evidence of fibromuscular dysplasia in the distal left V2 segment. See basilar description above.    Right femoral:    Normal vessel. No significant atherosclerotic disease. Arterial sheath in adequate position.   DISPOSITION:  Upon completion of the study, the femoral sheath was removed and hemostasis obtained using a 5-Fr ExoSeal closure device. Good proximal and distal lower extremity pulses were documented upon achievement of hemostasis.    The procedure was well tolerated  and no early complications were observed.       The patient was transferred back to the holding area to be positioned flat in bed for 3 hours of observation.    IMPRESSION:  1. Beaded  string appearance of the distal segments of the anterior cerebral and middle cerebral arteries bilaterally.  This  Finding can be seen in the reversible cerebral vasoconstriction syndrome.  Alternatively, this could potentially represent intracranial atherosclerotic disease. 2. No intracranial aneurysms, arteriovenous malformations, or high flow fistulas are seen to explain the subarachnoid hemorrhage.  The preliminary results of this procedure were shared with the patient and the patient's family.

## 2014-07-24 ENCOUNTER — Ambulatory Visit (AMBULATORY_SURGERY_CENTER): Payer: Self-pay

## 2014-07-24 VITALS — Ht 66.0 in | Wt 176.4 lb

## 2014-07-24 DIAGNOSIS — Z83719 Family history of colon polyps, unspecified: Secondary | ICD-10-CM

## 2014-07-24 DIAGNOSIS — Z8371 Family history of colonic polyps: Secondary | ICD-10-CM

## 2014-07-24 MED ORDER — SUPREP BOWEL PREP KIT 17.5-3.13-1.6 GM/177ML PO SOLN: 1.0000 | Freq: Once | ORAL | Status: DC

## 2014-07-24 NOTE — Progress Notes (Signed)
No allergies to eggs or soy No diet/weight loss meds No home oxygen No past problems with anesthesia  Does not want to watch emmi instructions; has had colon before

## 2014-08-07 ENCOUNTER — Encounter: Payer: Self-pay | Admitting: Internal Medicine

## 2014-08-07 ENCOUNTER — Ambulatory Visit (AMBULATORY_SURGERY_CENTER): Payer: BC Managed Care – PPO | Admitting: Internal Medicine

## 2014-08-07 VITALS — BP 126/80 | HR 51 | Temp 97.4°F | Resp 19 | Ht 66.0 in | Wt 176.0 lb

## 2014-08-07 DIAGNOSIS — Z1211 Encounter for screening for malignant neoplasm of colon: Secondary | ICD-10-CM

## 2014-08-07 DIAGNOSIS — D123 Benign neoplasm of transverse colon: Secondary | ICD-10-CM

## 2014-08-07 MED ORDER — SODIUM CHLORIDE 0.9 % IV SOLN: 500.0000 mL | INTRAVENOUS | Status: DC

## 2014-08-07 NOTE — Patient Instructions (Signed)
YOU HAD AN ENDOSCOPIC PROCEDURE TODAY AT [facility]:   Refer to the procedure report that was given to you for any specific questions about what was found during the examination.  If the procedure report does not answer your questions, please call your gastroenterologist to clarify.  If you requested that your care partner not be given the details of your procedure findings, then the procedure report has been included in a sealed envelope for you to review at your convenience later.  YOU SHOULD EXPECT: Some feelings of bloating in the abdomen. Passage of more gas than usual.  Walking can help get rid of the air that was put into your GI tract during the procedure and reduce the bloating. If you had a lower endoscopy (such as a colonoscopy or flexible sigmoidoscopy) you may notice spotting of blood in your stool or on the toilet paper. If you underwent a bowel prep for your procedure, you may not have a normal bowel movement for a few days.  Please Note:  You might notice some irritation and congestion in your nose or some drainage.  This is from the oxygen used during your procedure.  There is no need for concern and it should clear up in a day or so.  SYMPTOMS TO REPORT IMMEDIATELY:   Following lower endoscopy (colonoscopy or flexible sigmoidoscopy):  Excessive amounts of blood in the stool  Significant tenderness or worsening of abdominal pains  Swelling of the abdomen that is new, acute  Fever of 100F or higher  For urgent or emergent issues, a gastroenterologist can be reached at any hour by calling [phone].  DIET: Your first meal following the procedure should be a small meal and then it is ok to progress to your normal diet. Heavy or fried foods are harder to digest and may make you feel nauseous or bloated.  Likewise, meals heavy in dairy and vegetables can increase bloating.  Drink plenty of fluids but you should avoid alcoholic beverages for 24 hours.  ACTIVITY:   You should plan to take it easy for the rest of today and you should NOT DRIVE or use heavy machinery until tomorrow (because of the sedation medicines used during the test).    FOLLOW UP: Our staff will call the number listed on your records the next business day following your procedure to check on you and address any questions or concerns that you may have regarding the information given to you following your procedure. If we do not reach you, we will leave a message.  However, if you are feeling well and you are not experiencing any problems, there is no need to return our call.  We will assume that you have returned to your regular daily activities without incident.  If any biopsies were taken you will be contacted by phone or by letter within the next 1-3 weeks.  Please call us at [phone] if you have not heard about the biopsies in 3 weeks.    SIGNATURES/CONFIDENTIALITY: You and/or your care partner have signed paperwork which will be entered into your electronic medical record.  These signatures attest to the fact that that the information above on your After Visit Summary has been reviewed and is understood.  Full responsibility of the confidentiality of this discharge information lies with you and/or your care-partner.  Continue your normal medications  Please read over handout about polyps

## 2014-08-07 NOTE — Op Note (Signed)
[facility]  [address] [place], [id number]   COLONOSCOPY PROCEDURE REPORT  PATIENT: Vicki Hale, Vicki Hale  MR#: [id number] BIRTHDATE: [birth year] , 63  yrs. old GENDER: female ENDOSCOPIST: Jerene Bears, MD REFERRED CB:JSEGB Hamrick, M.D. PROCEDURE DATE:  08/07/2014 PROCEDURE:   Colonoscopy, screening, Colonoscopy with snare polypectomy, and Colonoscopy with cold biopsy polypectomy First Screening Colonoscopy - Avg.  risk and is 50 yrs.  old or older - No.  Prior Negative Screening - Now for repeat screening. 10 or more years since last screening  History of Adenoma - Now for follow-up colonoscopy & has been > or = to 3 yrs.  N/A  Polyps removed today? Yes ASA CLASS:   Class II INDICATIONS:Screening for colonic neoplasia and Colorectal Neoplasm Risk Assessment for this procedure is average risk. MEDICATIONS: Monitored anesthesia care and Propofol 250 mg IV  DESCRIPTION OF PROCEDURE:   After the risks benefits and alternatives of the procedure were thoroughly explained, informed consent was obtained.  The digital rectal exam revealed no rectal mass.   The LB [id number]-H190 [id number]  endoscope was introduced through the anus and advanced to the cecum, which was identified by both the appendix and ileocecal valve. No adverse events experienced. The quality of the prep was good.  (Suprep was used)  The instrument was then slowly withdrawn as the colon was fully examined. Estimated blood loss is zero unless otherwise noted in this procedure report.   COLON FINDINGS: Two sessile polyps ranging between 3-53mm in size were found at the hepatic flexure and in the transverse colon. Polypectomies were performed with a cold snare and with cold forceps.  The resection was complete, the polyp tissue was completely retrieved and sent to histology.   The examination was otherwise normal.  Retroflexed views revealed no abnormalities. The time to cecum = 4.0 Withdrawal time = 13.0   The  scope was withdrawn and the procedure completed. COMPLICATIONS: There were no immediate complications.  ENDOSCOPIC IMPRESSION: 1.   Two sessile polyps ranging between 3-46mm in size were found at the hepatic flexure and in the transverse colon; polypectomies were performed with a cold snare and with cold forceps 2.   The examination was otherwise normal  RECOMMENDATIONS: 1.  Await pathology results 2.  If the polyps removed today are proven to be adenomatous (pre-cancerous) polyps, you will need a repeat colonoscopy in 5 years.  Otherwise you should continue to follow colorectal cancer screening guidelines for "routine risk" patients with colonoscopy in 10 years.  You will receive a letter within 1-2 weeks with the results of your biopsy as well as final recommendations.  Please call my office if you have not received a letter after 3 weeks.  eSigned:  Jerene Bears, MD 08/07/2014 8:28 AM cc: Daiva Eves, MD and The Patient

## 2014-08-07 NOTE — Progress Notes (Signed)
A/ox3 pleased with MAC, report to Kristen RN 

## 2014-08-07 NOTE — Progress Notes (Signed)
Called to room to assist during endoscopic procedure.  Patient ID and intended procedure confirmed with present staff. Received instructions for my participation in the procedure from the performing physician.  

## 2014-08-08 ENCOUNTER — Telehealth: Payer: Self-pay | Admitting: *Deleted

## 2014-08-08 NOTE — Telephone Encounter (Signed)
No answer, left message to call if questions or concerns. 

## 2014-08-13 ENCOUNTER — Encounter: Payer: Self-pay | Admitting: Internal Medicine

## 2014-08-15 ENCOUNTER — Encounter: Payer: Self-pay | Admitting: Neurology

## 2014-08-15 ENCOUNTER — Ambulatory Visit (INDEPENDENT_AMBULATORY_CARE_PROVIDER_SITE_OTHER): Payer: BC Managed Care – PPO | Admitting: Neurology

## 2014-08-15 VITALS — BP 130/75 | HR 53 | Ht 66.0 in | Wt 178.0 lb

## 2014-08-15 DIAGNOSIS — I609 Nontraumatic subarachnoid hemorrhage, unspecified: Secondary | ICD-10-CM | POA: Diagnosis not present

## 2014-08-15 DIAGNOSIS — Z8543 Personal history of malignant neoplasm of ovary: Secondary | ICD-10-CM | POA: Diagnosis not present

## 2014-08-15 NOTE — Patient Instructions (Signed)
-   close clinical observation - follow up with Dr. Kathyrn Sheriff in 08/2014 - will draw blood for antoimmune work up but suspicious is low - will repeat MRI brain with and without contrast in 10/2014 to rule out underlying structural lesion, but suspicious is low - will do pan CT in 10/2014 to rule out malignancy, but suspicious is low - follow up with PCP regularly. - follow up in 3 months and will order MRI and CT at that time.

## 2014-08-15 NOTE — Progress Notes (Signed)
STROKE NEUROLOGY FOLLOW UP NOTE  NAME: Vicki Hale DOB: 1950-06-08  REASON FOR VISIT: stroke follow up HISTORY FROM: pt and chart  Today we had the pleasure of seeing Vicki Hale in follow-up at our Neurology Clinic. Pt was accompanied by no one.   History Summary Vicki Hale is an 65 y.o. female with hx of ovarian Ca stage IIIb s/p surgery and chemo in 2003, cleared by oncologist in 2011 not on hormone replacement therapy was admitted on 05/12/2014 for headache. She had a severe headache 10 days prior to admission, but gradually resolved. About 8 days prior to admission she had a severe headache again which promoted her to see her PCP. MRI was done showed left frontal subarachnoid hemorrhage. Patient had severe episode of severe headache on the day of admission. Neurosurgery Dr. Kathyrn Hale was consulted, performed DSA which did not show any aneurysm but distal vessel mild irregularity. During the admission, patient headache gradually getting better. However, she complains some subjective visual changes. She had EEG and TCD done which were all unremarkable. She was discharge in good condition with clinic follow up.  Interval History During the interval time, the patient has been doing well. She stated that she had headache 4 days after discharge but no more since then. She generally feels good. She saw Dr. Kathyrn Hale and no further plan for angio. She will see him again in 08/2013.   She stated that she had ovarian Ca in 2003, BRCA 1 positive, with bilateral involvement, and also spread to surface of colon, so staging is IIIB. She received surgery with removal both ovaries and cleaned the surface of colon. After that she had chemo therapy for 8 sessions, no radiation. She said her ovarian cancer was aggressive type but responding well to chemo. Although she has 10% chance of survive, she survived well and oncology discharged her from his clinic in 2011. She since then followed  up with PCP without problem.      Her daughter also had breast cancer likely due to genetic BRCA1 gene. She denies any family members with dementia, brain hemorrhage or stroke.   REVIEW OF SYSTEMS: Full 14 system review of systems performed and notable only for those listed below and in HPI above, all others are negative:  Constitutional:   Cardiovascular:  Ear/Nose/Throat:  Runny nose Skin:  Eyes:   Respiratory:  Cough Gastroitestinal:   Genitourinary:  Hematology/Lymphatic:   Endocrine:  Musculoskeletal:   Allergy/Immunology:  Env allergies Neurological:   Psychiatric:  Sleep: frequent waking  The following represents the patient's updated allergies and side effects list: Allergies  Allergen Reactions  . Penicillins Rash    The neurologically relevant items on the patient's problem list were reviewed on today's visit.  Neurologic Examination  A problem focused neurological exam (12 or more points of the single system neurologic examination, vital signs counts as 1 point, cranial nerves count for 8 points) was performed.  Blood pressure 130/75, pulse 53, height '5\' 6"'  (1.676 m), weight 178 lb (80.74 kg).  General - Well nourished, well developed, in no apparent distress.  Ophthalmologic - Sharp disc margins OU. Left eyes redness with conjunctiva projections - pt stated that is due to allergy  Cardiovascular - Regular rate and rhythm with no murmur.  Mental Status -  Level of arousal and orientation to time, place, and person were intact. Language including expression, naming, repetition, comprehension was assessed and found intact. Attention span and concentration were normal. Recent and remote  memory were intact. Fund of Knowledge was assessed and was intact.  Cranial Nerves II - XII - II - Visual field intact OU. III, IV, VI - Extraocular movements intact. V - Facial sensation intact bilaterally. VII - Facial movement intact bilaterally. VIII - Hearing &  vestibular intact bilaterally. X - Palate elevates symmetrically. XI - Chin turning & shoulder shrug intact bilaterally. XII - Tongue protrusion intact.  Motor Strength - The patient's strength was normal in all extremities and pronator drift was absent.  Bulk was normal and fasciculations were absent.   Motor Tone - Muscle tone was assessed at the neck and appendages and was normal.  Reflexes - The patient's reflexes were 1+ in all extremities and she had no pathological reflexes.  Sensory - Light touch, temperature/pinprick, vibration and proprioception, and Romberg testing were assessed and were normal.    Coordination - The patient had normal movements in the hands and feet with no ataxia or dysmetria.  Tremor was absent.  Gait and Station - The patient's transfers, posture, gait, station, and turns were observed as normal.  Data reviewed: I personally reviewed the images and agree with the radiology interpretations.  Ct Head Wo Contrast  05/13/2014 IMPRESSION: 1. Stable small amount of subarachnoid hemorrhage in the high left frontal lobe similar in volume compared with 05/11/2014.   05/12/2014 IMPRESSION: 1. Mild acute subarachnoid hemorrhage again noted along the high left frontoparietal region, in a similar distribution to the recent prior study. The amount of subarachnoid hemorrhage is difficult to compare with prior MRI. 2. Otherwise unremarkable noncontrast CT of the head.   Mr Vicki Hale Wo Contrast  05/09/2014 IMPRESSION: 1. Small volume of subarachnoid hemorrhage over the left superior frontal convexity. Etiology is unknown at this time, with no secondary findings of infarct, vascular malformation, venous thrombosis, tumor, etc.   DSA - 1. Subtle, beaded string appearance of the 3rd and 4th segments of bilateral ACA and MCA territories, perhaps most prominent on in the distal left MCA territory. This could represent intracranial atherosclerosis, or possibly the  reversible cerebral vasoconstriction syndrome. 2. No aneurysms, AVM, or high-flow fistulas are seen   EEG - this is a normal awake and drowsy EEG. Please, be aware that a normal EEG does not exclude the possibility of epilepsy. Clinical correlation is advised.   TCD - no vasospasm.   Component     Latest Ref Rng 05/13/2014  TSH     0.350 - 4.500 uIU/mL 0.736    Assessment: As you may recall, she is a 64 y.o. Caucasian female with PMH of ovarian Ca IIIB s/p surgery and chemo 11 years ago was admitted 05/12/14 for HA and found to have left frontal SAH by CT and MRI. MRI with and without contrast did not show underlying pathology. Subsequent work up with cerebral angio, EEG, TCD all unremarkable. She was discharged after HA getting better. Etiology unclear, could be due to RCVS, early CAA presentation, vasculitis (but angio negative), small venous malformation. During the interval time, she is doing well. Not on meds. Will continue observation, finish off autoimmune work up, also would like to repeat MRI with and without contrast and pan CT at 6 months post episodoe to rule out malignancy at that time due to the hx of malignancy.  Plan:  - close clinical observation - follow up with Dr. Kathyrn Hale in 08/2014 - autoimmune work up - will repeat MRI brain with and without contrast in 10/2014 to rule out underlying structural lesion given  cancer hx - pan CT in 10/2014 to rule out malignancy given cancer hx - follow up with PCP regularly. - RTC in 3 months.  Orders Placed This Encounter  Procedures  . Systemic Lupus Profile A  . Anti-DNA antibody, double-stranded  . Rheumatoid factor  . Basic metabolic panel    No orders of the defined types were placed in this encounter.    Patient Instructions  - close clinical observation - follow up with Dr. Kathyrn Hale in 08/2014 - will draw blood for antoimmune work up but suspicious is low - will repeat MRI brain with and without contrast in 10/2014  to rule out underlying structural lesion, but suspicious is low - will do pan CT in 10/2014 to rule out malignancy, but suspicious is low - follow up with PCP regularly. - follow up in 3 months and will order MRI and CT at that time.    Rosalin Hawking, MD PhD [facility] [address], [place], [place] [address] [phone]

## 2014-08-16 LAB — BASIC METABOLIC PANEL
BUN/Creatinine Ratio: 24 (ref 11–26)
BUN: 18 mg/dL (ref 8–27)
CO2: 22 mmol/L (ref 18–29)
Calcium: 10.1 mg/dL (ref 8.7–10.3)
Chloride: 100 mmol/L (ref 97–108)
Creatinine, Ser: 0.75 mg/dL (ref 0.57–1.00)
GFR calc Af Amer: 99 mL/min/{1.73_m2} (ref >59)
GFR calc non Af Amer: 86 mL/min/{1.73_m2} (ref >59)
Glucose: 87 mg/dL (ref 65–99)
Potassium: 4.7 mmol/L (ref 3.5–5.2)
Sodium: 140 mmol/L (ref 134–144)

## 2014-08-16 LAB — SYSTEMIC LUPUS PROFILE A
Chromatin Ab SerPl-aCnc: <0.2 AI (ref 0.0–0.9)
ENA RNP Ab: 0.3 AI (ref 0.0–0.9)
ENA SM Ab Ser-aCnc: <0.2 AI (ref 0.0–0.9)
ENA SSA (RO) Ab: <0.2 AI (ref 0.0–0.9)
ENA SSB (LA) Ab: <0.2 AI (ref 0.0–0.9)
Rhuematoid fact SerPl-aCnc: 13.5 IU/mL (ref 0.0–13.9)
dsDNA Ab: <1 IU/mL (ref 0–9)

## 2014-08-28 ENCOUNTER — Telehealth: Payer: Self-pay

## 2014-08-28 NOTE — Telephone Encounter (Signed)
Left patient vm to call back about results. Okay to inform patient that all of her blood draw labs were normal, and continue the treatment plan.

## 2014-09-01 NOTE — Telephone Encounter (Signed)
Patient called back about her lab results and information given that blood labs were normal and to continue treatment plan.  She voiced understanding.

## 2014-11-18 ENCOUNTER — Ambulatory Visit: Payer: BC Managed Care – PPO | Admitting: Neurology

## 2015-01-18 DIAGNOSIS — C50919 Malignant neoplasm of unspecified site of unspecified female breast: Secondary | ICD-10-CM

## 2015-01-18 HISTORY — DX: Malignant neoplasm of unspecified site of unspecified female breast: C50.919

## 2015-01-28 ENCOUNTER — Other Ambulatory Visit: Payer: Self-pay | Admitting: Radiology

## 2015-02-04 ENCOUNTER — Encounter: Payer: Self-pay | Admitting: Genetic Counselor

## 2015-02-06 ENCOUNTER — Encounter: Payer: Self-pay | Admitting: *Deleted

## 2015-02-09 ENCOUNTER — Telehealth: Payer: Self-pay | Admitting: Oncology

## 2015-02-09 NOTE — Telephone Encounter (Signed)
new breast appt-s/w patient and gave appt for 01/26 @ 4 w/Dr. Jana Hakim, 01/25 @ 11 w/Karen Florene Glen

## 2015-02-11 ENCOUNTER — Other Ambulatory Visit: Payer: BC Managed Care – PPO

## 2015-02-11 ENCOUNTER — Other Ambulatory Visit: Payer: Self-pay | Admitting: *Deleted

## 2015-02-11 ENCOUNTER — Encounter: Payer: Self-pay | Admitting: Genetic Counselor

## 2015-02-11 ENCOUNTER — Ambulatory Visit (HOSPITAL_BASED_OUTPATIENT_CLINIC_OR_DEPARTMENT_OTHER): Payer: BC Managed Care – PPO | Admitting: Genetic Counselor

## 2015-02-11 DIAGNOSIS — Z8481 Family history of carrier of genetic disease: Secondary | ICD-10-CM | POA: Insufficient documentation

## 2015-02-11 DIAGNOSIS — C50919 Malignant neoplasm of unspecified site of unspecified female breast: Secondary | ICD-10-CM

## 2015-02-11 DIAGNOSIS — Z8543 Personal history of malignant neoplasm of ovary: Secondary | ICD-10-CM | POA: Diagnosis not present

## 2015-02-11 DIAGNOSIS — Z315 Encounter for genetic counseling: Secondary | ICD-10-CM

## 2015-02-11 DIAGNOSIS — C50412 Malignant neoplasm of upper-outer quadrant of left female breast: Secondary | ICD-10-CM | POA: Insufficient documentation

## 2015-02-11 DIAGNOSIS — Z170 Estrogen receptor positive status [ER+]: Secondary | ICD-10-CM | POA: Insufficient documentation

## 2015-02-11 DIAGNOSIS — Z803 Family history of malignant neoplasm of breast: Secondary | ICD-10-CM | POA: Insufficient documentation

## 2015-02-11 DIAGNOSIS — Z800 Family history of malignant neoplasm of digestive organs: Secondary | ICD-10-CM

## 2015-02-11 DIAGNOSIS — Z801 Family history of malignant neoplasm of trachea, bronchus and lung: Secondary | ICD-10-CM

## 2015-02-11 NOTE — Progress Notes (Signed)
REFERRING PROVIDER: Leonides Sake, MD [place], [place] [address]   Lurline Del, MD  PRIMARY PROVIDER:  Leonides Sake, MD  PRIMARY REASON FOR VISIT:  1. Malignant neoplasm of female breast, unspecified laterality, unspecified site of breast ([place])   2. History of ovarian cancer   3. FH: BRCA1 gene positive   4. Family history of breast cancer   5. Family history of pancreatic cancer      HISTORY OF PRESENT ILLNESS:   Vicki Hale, a 65 y.o. female, was seen for a [facility] cancer genetics consultation at the request of Dr. Jana Hakim due to a personal and family history of cancer.  Vicki Hale presents to clinic today to discuss the possibility of a hereditary predisposition to cancer, genetic testing, and to further clarify her future cancer risks, as well as potential cancer risks for family members. Vicki Hale has two daughters, both of whom are BRCA1 positive.  In 2002, at the age of 76, Vicki Hale was diagnosed with Ovarian cancer.  In 2017, at the age of 44, Vicki Hale was diagnosed with breast cancer.   The tumor is ER+/PR+/Her2-. CANCER HISTORY:   No history exists.     HORMONAL RISK FACTORS:  First live birth at age 60.  Ovaries intact: no.  Hysterectomy: yes.  Menopausal status: postmenopausal.  HRT use: 0 years. Mammogram within the last year: yes.   Past Medical History  Diagnosis Date  . Ovarian cancer ([place])   . Stroke ([place])   . Breast cancer ([place]) 2017    [facility] in [place]  . Family history of breast cancer   . FH: BRCA1 gene positive   . Family history of pancreatic cancer     Past Surgical History  Procedure Laterality Date  . Tonsillectomy    . Hernia repair      Social History   Social History  . Marital Status: Unknown    Spouse Name: N/A  . Number of Children: N/A  . Years of Education: N/A   Social History Main Topics  . Smoking status: Never Smoker   . Smokeless tobacco: Never Used  . Alcohol Use: 0.0 oz/week     0 Standard drinks or equivalent per week     Comment: ocassional; once monthly  . Drug Use: No  . Sexual Activity: Not on file   Other Topics Concern  . Not on file   Social History Narrative     FAMILY HISTORY:  We obtained a detailed, 4-generation family history.  Significant diagnoses are listed below: Family History  Problem Relation Age of Onset  . Colon polyps Mother   . Colon cancer Neg Hx   . Rectal cancer Neg Hx   . Ulcerative colitis Neg Hx   . Stomach cancer Neg Hx   . Breast cancer Daughter 70  . BRCA 1/2 Daughter     BRCA1  . COPD Maternal Aunt   . Esophageal cancer Maternal Uncle   . Lung cancer Paternal Aunt   . Pancreatic cancer Maternal Grandmother     early 76s  . Prostate cancer Maternal Grandfather     22s  . Skin cancer Paternal Grandmother     untreated BCC  . COPD Maternal Aunt   . Lung cancer Maternal Aunt   . Breast cancer Cousin 2    maternal first cousin  . BRCA 1/2 Daughter     BRCA1  . Esophageal cancer Cousin     maternal first cousin  .  Bladder Cancer Paternal Aunt     The patient has two daughters.  One was diagnosed with breast cancer at age 1 and was found to be BRCA1 positive.  The other daughter underwent genetic testing and also was found to be BRCA1 positive.  The patient had one brother who died at 98 from suicide and one maternal half brother who is 13.  Her parents are both deceased.  Her father died in a car accident at 80 and her mother died at 58 and was cancer free.  Her mother had four sisters and one brother.  Her brother died from esophageal cancer, one sister died at 5 from COPD, another sister had lung cancer.  The patient's uncle had a duaghter with breast cancer at 7.  The patient's maternal grandmother was diagnosed with pancreatic cancer in her early 33s and her grandfather died in his 33s from prostate cancer.  The patient's father had two sisters, one who had bladder cancer and the other with lung cancer.  Her  paternal grandmother died from untreated [place].  Patient's maternal ancestors are of Caucasian descent, and paternal ancestors are of English descent. There is no reported Ashkenazi Jewish ancestry. There is no known consanguinity.  GENETIC COUNSELING ASSESSMENT: [name] is a 66 y.o. female with a personal and family history of breast cancer and a known familial mutation in BRCA1 which is somewhat suggestive of a hereditary breast and ovarian cancer syndrome and predisposition to cancer. We, therefore, discussed and recommended the following at today's visit.   DISCUSSION: We discussed that about 5-10% of breast cancer and about 15-20% of ovairan cancer is due to hereditary mutations. Most commonly, these are due to BRCA mutations.  Based on her daughters' BRCA1 status, Vicki Hale most likely will be found to be a carrier of this as well.  We reviewed the characteristics, features and inheritance patterns of hereditary cancer syndromes. We also discussed genetic testing, including the appropriate family members to test, the process of testing, insurance coverage and turn-around-time for results. We discussed the implications of a negative, positive and/or variant of uncertain significant result. We recommended Vicki Hale pursue genetic testing for the targeted BRCA1 gene mutation called IVS%-11T>G.   Based on [name]'s personal and family history of cancer, she meets medical criteria for genetic testing. Despite that she meets criteria, she may still have an out of pocket cost. We discussed that if her out of pocket cost for testing is over $100, the laboratory will call and confirm whether she wants to proceed with testing.  If the out of pocket cost of testing is less than $100 she will be billed by the genetic testing laboratory.   PLAN: After considering the risks, benefits, and limitations, Vicki Hale  provided informed consent to pursue genetic testing and the blood sample was sent to [facility] for analysis of the BRCA1 gene. Results should be available within approximately 7-10 days' time, at which point they will be disclosed by telephone to Vicki Hale, as will any additional recommendations warranted by these results. Vicki Hale will receive a summary of her genetic counseling visit and a copy of her results once available. This information will also be available in Epic. We encouraged Vicki Hale to remain in contact with cancer genetics annually so that we can continuously update the family history and inform her of any changes in cancer genetics and testing that may be of benefit for her family. Vicki Hale questions were answered to  her satisfaction today. Our contact information was provided should additional questions or concerns arise.  Lastly, we encouraged Vicki Hale to remain in contact with cancer genetics annually so that we can continuously update the family history and inform her of any changes in cancer genetics and testing that may be of benefit for this family.   Ms.  Hale questions were answered to her satisfaction today. Our contact information was provided should additional questions or concerns arise. Thank you for the referral and allowing [place] to share in the care of your patient.   Karen P. Florene Glen, [place], [facility] Certified Genetic Counselor [email or web address] phone: [phone]  The patient was seen for a total of 60 minutes in face-to-face genetic counseling.  This patient was discussed with Drs. [name], Lindi Adie and/or Burr Medico who agrees with the above.    _______________________________________________________________________ For Office Staff:  Number of people involved in session: 1 Was an Intern/ student involved with case: yes

## 2015-02-12 ENCOUNTER — Ambulatory Visit (HOSPITAL_BASED_OUTPATIENT_CLINIC_OR_DEPARTMENT_OTHER): Payer: BC Managed Care – PPO | Admitting: Oncology

## 2015-02-12 ENCOUNTER — Other Ambulatory Visit (HOSPITAL_BASED_OUTPATIENT_CLINIC_OR_DEPARTMENT_OTHER): Payer: BC Managed Care – PPO

## 2015-02-12 ENCOUNTER — Encounter: Payer: Self-pay | Admitting: Oncology

## 2015-02-12 ENCOUNTER — Other Ambulatory Visit: Payer: Self-pay | Admitting: Oncology

## 2015-02-12 VITALS — BP 137/69 | HR 63 | Temp 98.1°F | Resp 18 | Ht 66.0 in | Wt 177.0 lb

## 2015-02-12 DIAGNOSIS — C50412 Malignant neoplasm of upper-outer quadrant of left female breast: Secondary | ICD-10-CM

## 2015-02-12 DIAGNOSIS — C50919 Malignant neoplasm of unspecified site of unspecified female breast: Secondary | ICD-10-CM | POA: Diagnosis not present

## 2015-02-12 DIAGNOSIS — C569 Malignant neoplasm of unspecified ovary: Secondary | ICD-10-CM

## 2015-02-12 DIAGNOSIS — Z170 Estrogen receptor positive status [ER+]: Secondary | ICD-10-CM | POA: Diagnosis not present

## 2015-02-12 DIAGNOSIS — Z1501 Genetic susceptibility to malignant neoplasm of breast: Secondary | ICD-10-CM

## 2015-02-12 DIAGNOSIS — Z801 Family history of malignant neoplasm of trachea, bronchus and lung: Secondary | ICD-10-CM

## 2015-02-12 DIAGNOSIS — Z803 Family history of malignant neoplasm of breast: Secondary | ICD-10-CM | POA: Diagnosis not present

## 2015-02-12 DIAGNOSIS — Z1509 Genetic susceptibility to other malignant neoplasm: Secondary | ICD-10-CM

## 2015-02-12 DIAGNOSIS — Z808 Family history of malignant neoplasm of other organs or systems: Secondary | ICD-10-CM

## 2015-02-12 DIAGNOSIS — I609 Nontraumatic subarachnoid hemorrhage, unspecified: Secondary | ICD-10-CM

## 2015-02-12 DIAGNOSIS — Z8543 Personal history of malignant neoplasm of ovary: Secondary | ICD-10-CM

## 2015-02-12 LAB — CBC WITH DIFFERENTIAL/PLATELET
BASO%: 0.7 % (ref 0.0–2.0)
Basophils Absolute: 0.1 10*3/uL (ref 0.0–0.1)
EOS%: 3.4 % (ref 0.0–7.0)
Eosinophils Absolute: 0.3 10*3/uL (ref 0.0–0.5)
HCT: 43.3 % (ref 34.8–46.6)
HGB: 14.3 g/dL (ref 11.6–15.9)
LYMPH%: 19.4 % (ref 14.0–49.7)
MCH: 29.3 pg (ref 25.1–34.0)
MCHC: 33.1 g/dL (ref 31.5–36.0)
MCV: 88.6 fL (ref 79.5–101.0)
MONO#: 0.5 10*3/uL (ref 0.1–0.9)
MONO%: 6.3 % (ref 0.0–14.0)
NEUT#: 5.5 10*3/uL (ref 1.5–6.5)
NEUT%: 70.2 % (ref 38.4–76.8)
Platelets: 318 10*3/uL (ref 145–400)
RBC: 4.89 10*6/uL (ref 3.70–5.45)
RDW: 13 % (ref 11.2–14.5)
WBC: 7.8 10*3/uL (ref 3.9–10.3)
lymph#: 1.5 10*3/uL (ref 0.9–3.3)

## 2015-02-12 LAB — COMPREHENSIVE METABOLIC PANEL
ALT: 16 U/L (ref 0–55)
AST: 19 U/L (ref 5–34)
Albumin: 4.2 g/dL (ref 3.5–5.0)
Alkaline Phosphatase: 96 U/L (ref 40–150)
Anion Gap: 12 mEq/L — ABNORMAL HIGH (ref 3–11)
BUN: 19.8 mg/dL (ref 7.0–26.0)
CO2: 26 mEq/L (ref 22–29)
Calcium: 10.1 mg/dL (ref 8.4–10.4)
Chloride: 103 mEq/L (ref 98–109)
Creatinine: 1 mg/dL (ref 0.6–1.1)
EGFR: 62 mL/min/{1.73_m2} — ABNORMAL LOW (ref >90)
Glucose: 115 mg/dl (ref 70–140)
Potassium: 3.6 mEq/L (ref 3.5–5.1)
Sodium: 141 mEq/L (ref 136–145)
Total Bilirubin: 0.35 mg/dL (ref 0.20–1.20)
Total Protein: 7.7 g/dL (ref 6.4–8.3)

## 2015-02-12 NOTE — Progress Notes (Signed)
[facility]  [phone] [phone]     ID: Vicki Hale. Arlen DOB: Feb 19, 1950  MR#: [id number]  BHA#:[id number]  Patient Care Team: Leonides Sake, MD as PCP - General (Family Medicine) Chauncey Cruel, MD as Consulting Physician (Oncology) Rolm Bookbinder, MD as Consulting Physician (General Surgery) Consuella Lose, MD as Consulting Physician (Neurosurgery) Christene Slates, MD as Physician Assistant (Radiology) Rosalin Hawking, MD as Consulting Physician (Neurology) PCP: Leonides Sake, MD GYN: OTHER MD:  CHIEF COMPLAINT: Breast cancer in likely BRCA carrier  CURRENT TREATMENT: Awaiting definitive surgery   CANCER HISTORY: Vicki Hale was diagnosed with ovarian cancer in April 2003. She underwent total abdominal hysterectomy and bilateral salpingo-oophorectomy with a full staging operation at [facility] that month and was then treated according to GOG 182, with 4 cycles of carboplatin/topotecan and then 4 cycles of carboplatin/paclitaxel. She was then followed by gynecologic oncology through 2011, and there has been no history of recurrent disease.  On 97/35/3299 Vicki Hale underwent screening mammography at [facility] with tomosynthesis. Breast density was category A. New grouped calcifications were noted in the left breast upper outer quadrant and the patient was recalled for diagnostic unilateral left mammography 01/26/2015. This confirmed a group of new linear calcifications in the upper outer quadrant of the left breast extending over an area of 2.0 cm..   On 01/28/2015 the patient underwent biopsy of her left breast, with the pathology ([id number]) showing an invasive ductal carcinoma, grade 2, estrogen receptor 100% positive, progesterone receptor 50% positive, both with strong staining intensity, with an MIB-1 of 10% and no HER-2 amplification, the signals ratio being 1.21 and the number per cell 2.00.  Her subsequent history is as detailed below  INTERVAL  HISTORY: Vicki Hale was evaluated in the breast clinic 02/12/2015. Her case was also presented in the multidisciplinary breast cancer conference 02/04/2015. At that time a preliminary plan was proposed: Genetics referral, breast conserving surgery with sentinel lymph node sampling, and Oncotype.  REVIEW OF SYSTEMS: There were no specific symptoms leading to the original mammogram, which was routinely scheduled. The patient denies unusual headaches, visual changes, nausea, vomiting, stiff neck, dizziness, or gait imbalance. There has been no cough, phlegm production, or pleurisy, no chest pain or pressure, and no change in bowel or bladder habits. The patient denies fever, rash, bleeding, unexplained fatigue or unexplained weight loss. Vicki Hale admits to mild seasonal allergy symptoms and some arthritis involving her knees. She has no residuals from her subarachnoid hemorrhage last year. A detailed review of systems was otherwise entirely negative.  PAST MEDICAL HISTORY: Past Medical History  Diagnosis Date  . Ovarian cancer ([place])   . Stroke ([place])   . Breast cancer ([place]) 2017    [facility] in [place]  . Family history of breast cancer   . FH: BRCA1 gene positive   . Family history of pancreatic cancer   . SAH (subarachnoid hemorrhage) ([place])   . Pericarditis, acute     PAST SURGICAL HISTORY: Past Surgical History  Procedure Laterality Date  . Tonsillectomy    . Hernia repair    . Total abdominal hysterectomy w/ bilateral salpingoophorectomy    . Cesarean section      FAMILY HISTORY Family History  Problem Relation Age of Onset  . Colon polyps Mother   . Colon cancer Neg Hx   . Rectal cancer Neg Hx   . Ulcerative colitis Neg Hx   . Stomach cancer Neg Hx   . Breast cancer Daughter 22  . BRCA 1/2  Daughter     BRCA1  . COPD Maternal Aunt   . Esophageal cancer Maternal Uncle   . Lung cancer Paternal Aunt   . Pancreatic cancer Maternal Grandmother     early 59s  . Prostate cancer Maternal  Grandfather     86s  . Skin cancer Paternal Grandmother     untreated BCC  . COPD Maternal Aunt   . Lung cancer Maternal Aunt   . Breast cancer Cousin 67    maternal first cousin  . BRCA 1/2 Daughter     BRCA1  . Esophageal cancer Cousin     maternal first cousin  . Bladder Cancer Paternal Aunt    the patient's 2 daughters have been tested for the BRCA gene and both are positive. One of them had breast cancer at the age of 16. In addition on the maternal side there is pancreatic prostate breast and esophageal cancer. The patient's father died at the age of 15 and a car accident and there is very little information on his side of the family. The patient's mother died at 90 from COPD. She had had a hysterectomy in her 11s. The patient's mother had 5 siblings several from died young. The patient herself had no sisters, 2 brothers.  GYNECOLOGIC HISTORY:  No LMP recorded. Patient has had a hysterectomy.  Menarche age 20, first live birth age 73, the patient is [place] P2. She underwent TAH/BSO in 2003 for her ovarian cancer. She took hormone replacement for approximately 6 months. She was on oral contraceptives for approximately 4 years remotely, with no complications.  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 Her husband Shanon Brow works for a [employer]. Daughter Belia Febo lives in [place] where she works as a school principal. She is 14 years old. Daughter Makalya Nave lives in [place] and is a Pharmacist, hospital. She is 64 years old. These ages are as of January 2017. The patient has 4 grandchildren. She attends a [employer]    ADVANCED DIRECTIVES: Not in place   HEALTH MAINTENANCE: Social History  Substance Use Topics  . Smoking status: Never Smoker   . Smokeless tobacco: Never Used  . Alcohol Use: 0.0 oz/week    0 Standard drinks or equivalent per week     Comment: ocassional; once monthly     Colonoscopy:  September 2016/ [facility]  PAP:  Bone density: 2016  Lipid panel:  Allergies  Allergen Reactions  . Penicillins Rash    Current Outpatient Prescriptions  Medication Sig Dispense Refill  . acetaminophen (TYLENOL) 500 MG tablet Take 500 mg by mouth every 6 (six) hours as needed for headache.    . ibuprofen (ADVIL,MOTRIN) 600 MG tablet Take 1 tablet (600 mg total) by mouth every 8 (eight) hours as needed. 15 tablet 0   No current facility-administered medications for this visit.    OBJECTIVE: Middle-aged white woman in no acute distress Filed Vitals:   02/12/15 1615  BP: 137/69  Pulse: 63  Temp: 98.1 F (36.7 C)  Resp: 18     Body mass index is 28.58 kg/(m^2).    ECOG FS:0 - Asymptomatic  Ocular: Sclerae unicteric, pupils equal, round and reactive to light Ear-nose-throat: Oropharynx clear and moist Lymphatic: No cervical or supraclavicular adenopathy Lungs no rales or rhonchi, good excursion bilaterally Heart regular rate and rhythm, no murmur appreciated Abd soft, nontender, positive bowel sounds MSK no focal spinal  tenderness, no joint edema Neuro: non-focal, well-oriented, appropriate affect Breasts: The right breast is unremarkable. The left breast is status post recent biopsy. There is no palpable mass. There is no significant ecchymosis from the recent biopsy. The left axilla is benign.   LAB RESULTS:  CMP     Component Value Date/Time   NA 141 02/12/2015 1543   NA 140 08/15/2014 1104   NA 133* 05/11/2014 2229   K 3.6 02/12/2015 1543   K 4.7 08/15/2014 1104   CL 100 08/15/2014 1104   CO2 26 02/12/2015 1543   CO2 22 08/15/2014 1104   GLUCOSE 115 02/12/2015 1543   GLUCOSE 87 08/15/2014 1104   GLUCOSE 139* 05/11/2014 2229   BUN 19.8 02/12/2015 1543   BUN 18 08/15/2014 1104   BUN 13 05/11/2014 2229   CREATININE 1.0 02/12/2015 1543   CREATININE 0.75 08/15/2014 1104   CALCIUM 10.1 02/12/2015 1543   CALCIUM 10.1 08/15/2014 1104   PROT 7.7 02/12/2015 1543    ALBUMIN 4.2 02/12/2015 1543   AST 19 02/12/2015 1543   ALT 16 02/12/2015 1543   ALKPHOS 96 02/12/2015 1543   BILITOT 0.35 02/12/2015 1543   GFRNONAA 86 08/15/2014 1104   GFRAA 99 08/15/2014 1104    INo results found for: SPEP, UPEP  Lab Results  Component Value Date   WBC 7.8 02/12/2015   NEUTROABS 5.5 02/12/2015   HGB 14.3 02/12/2015   HCT 43.3 02/12/2015   MCV 88.6 02/12/2015   PLT 318 02/12/2015      Chemistry      Component Value Date/Time   NA 141 02/12/2015 1543   NA 140 08/15/2014 1104   NA 133* 05/11/2014 2229   K 3.6 02/12/2015 1543   K 4.7 08/15/2014 1104   CL 100 08/15/2014 1104   CO2 26 02/12/2015 1543   CO2 22 08/15/2014 1104   BUN 19.8 02/12/2015 1543   BUN 18 08/15/2014 1104   BUN 13 05/11/2014 2229   CREATININE 1.0 02/12/2015 1543   CREATININE 0.75 08/15/2014 1104      Component Value Date/Time   CALCIUM 10.1 02/12/2015 1543   CALCIUM 10.1 08/15/2014 1104   ALKPHOS 96 02/12/2015 1543   AST 19 02/12/2015 1543   ALT 16 02/12/2015 1543   BILITOT 0.35 02/12/2015 1543       No results found for: LABCA2  No components found for: LABCA125  No results for input(s): INR in the last 168 hours.  Urinalysis No results found for: COLORURINE, APPEARANCEUR, LABSPEC, PHURINE, GLUCOSEU, HGBUR, BILIRUBINUR, KETONESUR, PROTEINUR, UROBILINOGEN, NITRITE, LEUKOCYTESUR  STUDIES: Outside films reviewed  ASSESSMENT: 65 y.o. BRCA1 positive Vicki Hale, Vicki Hale woman  (1) ovarian cancer stage IIIB diagnosed April 2003  (a) s/p supracervical hysterectomy with surgical staging  (b) treated according to GOG 182: carboplatin/ topotecan x4 followed by carboplatin/taxol x4  (c) last chemotherapy dose October 2003   (2) status post left breast needle core biopsy 01/28/2015 for an invasive ductal carcinoma, grade 2, estrogen and progesterone receptor positive, HER-2 not amplified, with an MIB-1 of 10%  (3) definitive surgery pending  (4) consider Oncotype as  appropriate  (5) consider adjuvant radiation as appropriate  (6) adjuvant anti-estrogens to follow local treatment  PLAN: We spent the better part of today's hour-long appointment discussing the biology of breast cancer in general, and the specifics of the patient's tumor in particular. Vicki Hale understands she almost certainly carries a BRCA mutation. In fact I am going to be asked honest if she does not.  We discussed her situation in that light.  She understands the difference between local and systemic therapy for breast cancer. With regards to local treatment, namely surgery and radiation, it was important to understand that there is no survival difference between mastectomy and lumpectomy plus radiation. It is also important to know that even after mastectomy sometimes radiation is used, if the tumor was very large, if lymph nodes were positive, or if margins were involved.  We then discussed systemic treatment options. She understands her breast cancer cells "eat" estrogen, and that antiestrogen pills will "starve them" of both estrogen and progesterone. That accordingly may be a good option for her. On the other hand the cells do not "eat" HER-2 and therefore anti-HER-2 immunotherapy would not be helpful.  With regards to chemotherapy, we generally do not use it in tumors that are 5 mm or less. In the larger tumors that are slow growing and not particularly aggressive, we generally send an Oncotype. That allows [place] to quantitate the risk of recurrence with and without chemotherapy and allows the patient to make a definitive decision. Accordingly the chemotherapy decision has to wait until we have the final pathology.  We then discussed her BRCA positivity.this should be confirmed by the blood test she had drawn yesterday, with results coming in in about 3 weeks. The important thing for her to remember is that it is not absolutely necessary for her to remove both her breasts. As far as survival is  concerned a strategy of intensified surveillance with yearly mammography and breast MRI is safe. The survival results from bilateral mastectomies and intensified surveillance are the same.  Accordingly the decision is made on different grounds. Some patients prefer to work harder initially with bilateral mastectomy and reconstruction so as not to have 2 be concerned about further testing, biopsies, and treatment in the future. This is particularly true of younger patients who have many more years of risk ahead of that. Other patients prefer to keep there breast assuming this can be done with safe margins and good cosmetic results. They do not worry over much about future tests, biopsies etc. They would take those as they come.  At this point Vicki Hale is not quite sure which way she will go. She has pretty much decided she would want bilateral nipple sparing mastectomies with silicone implants if she goes that route, and she understands that if she does go for lumpectomy she would need radiation to follow.she thinks she is going to know what she wants to do in the next few days and she is going to let Dr. Excell Seltzer know of her decision.  We also discussed the possibility of going on an antiestrogen now so that she would have extra time to consider options and perhaps further opinions. She does not think she will have to take that long and so at this point we are not starting her on neoadjuvant anti-estrogens.  Vicki Hale has a good understanding of her options. She knows the goal of treatment in her case is cure, and that her chance of cure is going to be good to excellent. She will call with any problems that may develop before her next visit here.  Chauncey Cruel, MD   02/13/2015 4:55 PM Medical Oncology and Hematology [facility] [address] [place], [place] [address] Tel. [phone]    Fax. [phone]

## 2015-02-13 DIAGNOSIS — Z1501 Genetic susceptibility to malignant neoplasm of breast: Secondary | ICD-10-CM

## 2015-02-13 DIAGNOSIS — Z1509 Genetic susceptibility to other malignant neoplasm: Secondary | ICD-10-CM

## 2015-02-13 DIAGNOSIS — Z15060 Genetic susceptibility to colorectal cancer: Secondary | ICD-10-CM | POA: Insufficient documentation

## 2015-02-13 DIAGNOSIS — C569 Malignant neoplasm of unspecified ovary: Secondary | ICD-10-CM | POA: Insufficient documentation

## 2015-02-17 ENCOUNTER — Telehealth: Payer: Self-pay | Admitting: Oncology

## 2015-02-17 ENCOUNTER — Telehealth: Payer: Self-pay | Admitting: *Deleted

## 2015-02-17 NOTE — Telephone Encounter (Signed)
Called pt to introduce navigator resources. Denies questions or concerns regarding dx or treatment care plan. Gave pt contact information. Scheduled and confirmed appt with Dr. Marla Roe on 02/20/15.  Encourage pt call with needs. Received verbal understanding.

## 2015-02-17 NOTE — Telephone Encounter (Signed)
Called patient and left a message with follow up per pof   anne

## 2015-02-19 ENCOUNTER — Encounter: Payer: Self-pay | Admitting: Genetic Counselor

## 2015-02-19 ENCOUNTER — Telehealth: Payer: Self-pay | Admitting: Genetic Counselor

## 2015-02-19 DIAGNOSIS — Z1379 Encounter for other screening for genetic and chromosomal anomalies: Secondary | ICD-10-CM | POA: Insufficient documentation

## 2015-02-19 NOTE — Telephone Encounter (Signed)
LM on VM to please call back for test results.

## 2015-02-20 ENCOUNTER — Other Ambulatory Visit: Payer: Self-pay | Admitting: General Surgery

## 2015-02-23 ENCOUNTER — Telehealth: Payer: Self-pay | Admitting: Genetic Counselor

## 2015-02-23 ENCOUNTER — Encounter: Payer: Self-pay | Admitting: Genetic Counselor

## 2015-02-23 DIAGNOSIS — Z1501 Genetic susceptibility to malignant neoplasm of breast: Secondary | ICD-10-CM | POA: Insufficient documentation

## 2015-02-23 DIAGNOSIS — Z1509 Genetic susceptibility to other malignant neoplasm: Secondary | ICD-10-CM

## 2015-02-23 NOTE — Telephone Encounter (Signed)
Revealed that the [facility] in BRCA1 was found.  Offered to bring her in for genetic counseling.  She is interested in coming back in, but is going to be scheduled for surgery, maybe as early as this week.  She will call when she is ready to come in.

## 2015-02-24 ENCOUNTER — Other Ambulatory Visit: Payer: Self-pay | Admitting: Oncology

## 2015-02-24 MED ORDER — TAMOXIFEN CITRATE 20 MG PO TABS: 20.0000 mg | ORAL_TABLET | Freq: Every day | ORAL | Status: AC

## 2015-02-24 NOTE — Progress Notes (Unsigned)
I was called by Dr. Elisabeth Cara telling me that the surgery for Vicki Hale would be greatly delayed because of scheduling issues. We discussed starting her on tamoxifen. I called Vicki Hale, discussed this with her, and she is very agreeable. Accordingly I went ahead and called in tamoxifen for her. She has changed pharmacies so [facility]. She has a good understanding of the possible toxicities side effects and complications of this drug.

## 2015-02-27 ENCOUNTER — Other Ambulatory Visit: Payer: Self-pay | Admitting: *Deleted

## 2015-03-02 ENCOUNTER — Other Ambulatory Visit: Payer: Self-pay | Admitting: General Surgery

## 2015-03-02 ENCOUNTER — Telehealth: Payer: Self-pay

## 2015-03-02 DIAGNOSIS — C50912 Malignant neoplasm of unspecified site of left female breast: Secondary | ICD-10-CM

## 2015-03-02 NOTE — Telephone Encounter (Signed)
Called and left a message with new follow up appt

## 2015-03-06 ENCOUNTER — Other Ambulatory Visit (HOSPITAL_COMMUNITY): Payer: Self-pay | Admitting: *Deleted

## 2015-03-09 ENCOUNTER — Inpatient Hospital Stay (HOSPITAL_COMMUNITY)
Admission: RE | Admit: 2015-03-09 | Discharge: 2015-03-09 | Disposition: A | Discharge status: Home / Self Care | Payer: BC Managed Care – PPO | Source: Ambulatory Visit

## 2015-03-12 ENCOUNTER — Encounter (HOSPITAL_COMMUNITY)
Admission: RE | Admit: 2015-03-12 | Discharge: 2015-03-12 | Disposition: A | Discharge status: Home / Self Care | Payer: BC Managed Care – PPO | Source: Ambulatory Visit | Attending: General Surgery | Admitting: General Surgery

## 2015-03-12 ENCOUNTER — Encounter (HOSPITAL_COMMUNITY): Payer: Self-pay

## 2015-03-12 DIAGNOSIS — Z803 Family history of malignant neoplasm of breast: Secondary | ICD-10-CM | POA: Insufficient documentation

## 2015-03-12 DIAGNOSIS — Z90722 Acquired absence of ovaries, bilateral: Secondary | ICD-10-CM | POA: Insufficient documentation

## 2015-03-12 DIAGNOSIS — Z800 Family history of malignant neoplasm of digestive organs: Secondary | ICD-10-CM | POA: Insufficient documentation

## 2015-03-12 DIAGNOSIS — Z8543 Personal history of malignant neoplasm of ovary: Secondary | ICD-10-CM | POA: Diagnosis not present

## 2015-03-12 DIAGNOSIS — C50912 Malignant neoplasm of unspecified site of left female breast: Secondary | ICD-10-CM | POA: Insufficient documentation

## 2015-03-12 DIAGNOSIS — Z90710 Acquired absence of both cervix and uterus: Secondary | ICD-10-CM | POA: Insufficient documentation

## 2015-03-12 DIAGNOSIS — Z01818 Encounter for other preprocedural examination: Secondary | ICD-10-CM | POA: Diagnosis not present

## 2015-03-12 DIAGNOSIS — Z01812 Encounter for preprocedural laboratory examination: Secondary | ICD-10-CM | POA: Diagnosis not present

## 2015-03-12 DIAGNOSIS — Z1501 Genetic susceptibility to malignant neoplasm of breast: Secondary | ICD-10-CM | POA: Diagnosis not present

## 2015-03-12 DIAGNOSIS — Z9221 Personal history of antineoplastic chemotherapy: Secondary | ICD-10-CM | POA: Insufficient documentation

## 2015-03-12 DIAGNOSIS — Z8673 Personal history of transient ischemic attack (TIA), and cerebral infarction without residual deficits: Secondary | ICD-10-CM | POA: Insufficient documentation

## 2015-03-12 DIAGNOSIS — Z79899 Other long term (current) drug therapy: Secondary | ICD-10-CM | POA: Insufficient documentation

## 2015-03-12 HISTORY — DX: Unspecified osteoarthritis, unspecified site: M19.90

## 2015-03-12 LAB — BASIC METABOLIC PANEL
Anion gap: 9 (ref 5–15)
BUN: 20 mg/dL (ref 6–20)
CO2: 24 mmol/L (ref 22–32)
Calcium: 9.9 mg/dL (ref 8.9–10.3)
Chloride: 106 mmol/L (ref 101–111)
Creatinine, Ser: 0.82 mg/dL (ref 0.44–1.00)
GFR calc Af Amer: >60 mL/min (ref >60)
GFR calc non Af Amer: >60 mL/min (ref >60)
Glucose, Bld: 94 mg/dL (ref 65–99)
Potassium: 4.2 mmol/L (ref 3.5–5.1)
Sodium: 139 mmol/L (ref 135–145)

## 2015-03-12 LAB — CBC
HCT: 39.6 % (ref 36.0–46.0)
Hemoglobin: 12.7 g/dL (ref 12.0–15.0)
MCH: 28.6 pg (ref 26.0–34.0)
MCHC: 32.1 g/dL (ref 30.0–36.0)
MCV: 89.2 fL (ref 78.0–100.0)
Platelets: 314 10*3/uL (ref 150–400)
RBC: 4.44 MIL/uL (ref 3.87–5.11)
RDW: 12.6 % (ref 11.5–15.5)
WBC: 7.1 10*3/uL (ref 4.0–10.5)

## 2015-03-12 NOTE — Progress Notes (Addendum)
BRUISE LEFT LEG  KNEE DOWN, DRESSING TO FOOT , SWOLLEN ANKLE  ALL DUE TO COUNTER FALLING ON LEG (HOUSE REMODEL ).    CONFIRMED WITH CARLA THAT PATIENT IS SCHEDULED FOR NUC MED INJ AT 1000.

## 2015-03-12 NOTE — Progress Notes (Signed)
SPOKE WITH KATIE AT DR. DILLINGHAM'S OFFICE AND REQUESTED ORDERS FOR SURGERY.

## 2015-03-12 NOTE — Pre-Procedure Instructions (Signed)
Vicki Hale  [id number]      [facility] - [place], [place] - [address] [place]  [address] Phone: [phone] Fax: [phone]  [id number] - [place], [place] [place] [address] Phone: [phone] Fax: [phone]    Your procedure is scheduled on   Wednesday  03/18/15  Report to [facility] Admitting at 830 A.M.  Call this number if you have problems the morning of surgery:  [phone]   Remember:  Do not eat food or drink liquids after midnight.  Take these medicines the morning of surgery with A SIP OF WATER   TAMOXIFEN  (STOP ASPIRIN, COUMADIN, PLAVIX, EFFIENT, HERBAL MEDICINES, IBUPROFEN/ ADVIL/ MOTRIN, GOODY POWDERS/ BC'S)   Do not wear jewelry, make-up or nail polish.  Do not wear lotions, powders, or perfumes.  You may wear deodorant.  Do not shave 48 hours prior to surgery.  Men may shave face and neck.  Do not bring valuables to the hospital.  [facility] is not responsible for any belongings or valuables.  Contacts, dentures or bridgework may not be worn into surgery.  Leave your suitcase in the car.  After surgery it may be brought to your room.  For patients admitted to the hospital, discharge time will be determined by your treatment team.  Patients discharged the day of surgery will not be allowed to drive home.   Name and phone number of your driver:   Special instructions:  [facility] - Preparing for Surgery  Before surgery, you can play an important role.  Because skin is not sterile, your skin needs to be as free of germs as possible.  You can reduce the number of germs on you skin by washing with CHG (chlorahexidine gluconate) soap before surgery.  CHG is an antiseptic cleaner which kills germs and bonds with the skin to continue killing germs even after washing.  Please DO NOT use if you have an allergy to CHG  or antibacterial soaps.  If your skin becomes reddened/irritated stop using the CHG and inform your nurse when you arrive at Short Stay.  Do not shave (including legs and underarms) for at least 48 hours prior to the first CHG shower.  You may shave your face.  Please follow these instructions carefully:   1.  Shower with CHG Soap the night before surgery and the                                morning of Surgery.  2.  If you choose to wash your hair, wash your hair first as usual with your       normal shampoo.  3.  After you shampoo, rinse your hair and body thoroughly to remove the                      Shampoo.  4.  Use CHG as you would any other liquid soap.  You can apply chg directly       to the skin and wash gently with scrungie or a clean washcloth.  5.  Apply the CHG Soap to your body ONLY FROM THE NECK DOWN.        Do not use on open wounds or open sores.  Avoid contact with your eyes,  ears, mouth and genitals (private parts).  Wash genitals (private parts)       with your normal soap.  6.  Wash thoroughly, paying special attention to the area where your surgery        will be performed.  7.  Thoroughly rinse your body with warm water from the neck down.  8.  DO NOT shower/wash with your normal soap after using and rinsing off       the CHG Soap.  9.  Pat yourself dry with a clean towel.            10.  Wear clean pajamas.            11.  Place clean sheets on your bed the night of your first shower and do not        sleep with pets.  Day of Surgery  Do not apply any lotions/deoderants the morning of surgery.  Please wear clean clothes to the hospital/surgery center.    Please read over the following fact sheets that you were given. Pain Booklet, Coughing and Deep Breathing and Surgical Site Infection Prevention
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
## 2015-03-14 ENCOUNTER — Other Ambulatory Visit: Payer: Self-pay | Admitting: Plastic Surgery

## 2015-03-14 DIAGNOSIS — C50412 Malignant neoplasm of upper-outer quadrant of left female breast: Secondary | ICD-10-CM

## 2015-03-14 NOTE — H&P (Signed)
Vicki Hale is an 65 y.o. female.   Chief Complaint: Left breast cancer HPI: The patient is a 65 yrs old wf here for a history and physical for breast reconstruction. She is well known to the practice because her daughter was treated for BRCA positive disease. The patient went for a routine mammogram and was found to have calcifications of the LEFT upper outer quadrant. Biopsy revealed invasive ductal carcionoma, Grade 2, ER/PR positive and HER2 negative. She is being seen by Dr. Excell Seltzer. She was treated for ovarian cancer by a hysterectomy and salping-ooopherectomy in 2003. She is 5 feet 7 inches tall, weights 168 pounds, Preop bra = 38C. She is interested in bilateral mastectomies. BRAC testing pending. She is healthy and in reasonable physical shape. She has mild Grade II ptosis of her breasts as expected with breast feeding two children. Both daughters are BRCA positive. She is ok with not trying to save the NAC due to the ptosis. The sternal notch to the NAC rightt = 30 cm and left = 28 cm, IMF 9 cm.  Past Medical History  Diagnosis Date  . Ovarian cancer ([place])   . Stroke ([place])   . Breast cancer ([place]) 2017    [facility] in [place]  . Family history of breast cancer   . FH: BRCA1 gene positive   . Family history of pancreatic cancer   . SAH (subarachnoid hemorrhage) ([place])     04/2014  . Pericarditis, acute     30 YRS AGO   . Arthritis     Past Surgical History  Procedure Laterality Date  . Tonsillectomy    . Hernia repair    . Total abdominal hysterectomy w/ bilateral salpingoophorectomy    . Cesarean section      X2    Family History  Problem Relation Age of Onset  . Colon polyps Mother   . Colon cancer Neg Hx   . Rectal cancer Neg Hx   . Ulcerative colitis Neg Hx   . Stomach cancer Neg Hx   . Breast cancer Daughter 49  . BRCA 1/2 Daughter     BRCA1  . COPD Maternal Aunt   . Esophageal cancer Maternal Uncle   . Lung cancer Paternal Aunt   . Pancreatic cancer Maternal  Grandmother     early 39s  . Prostate cancer Maternal Grandfather     47s  . Skin cancer Paternal Grandmother     untreated BCC  . COPD Maternal Aunt   . Lung cancer Maternal Aunt   . Breast cancer Cousin 28    maternal first cousin  . BRCA 1/2 Daughter     BRCA1  . Esophageal cancer Cousin     maternal first cousin  . Bladder Cancer Paternal Aunt    Social History:  reports that she has never smoked. She has never used smokeless tobacco. She reports that she drinks alcohol. She reports that she does not use illicit drugs.  Allergies:  Allergies  Allergen Reactions  . Penicillins Itching and Rash    Has patient had a PCN reaction causing immediate rash, facial/tongue/throat swelling, SOB or lightheadedness with hypotension: No Has patient had a PCN reaction causing severe rash involving mucus membranes or skin necrosis: No Has patient had a PCN reaction that required hospitalization No Has patient had a PCN reaction occurring within the last 10 years: No If all of the above answers are "NO", then may proceed with Cephalosporin use.      (Not  in a hospital admission)  No results found for this or any previous visit (from the past 48 hour(s)). No results found.  Review of Systems  Constitutional: Negative.   HENT: Negative.   Eyes: Negative.   Respiratory: Negative.   Cardiovascular: Negative.   Gastrointestinal: Negative.   Genitourinary: Negative.   Musculoskeletal: Negative.   Skin: Negative.   Neurological: Negative.   Psychiatric/Behavioral: Negative.     There were no vitals taken for this visit. Physical Exam  Constitutional: She is oriented to person, place, and time. She appears well-developed and well-nourished.  HENT:  Head: Normocephalic and atraumatic.  Eyes: Conjunctivae and EOM are normal. Pupils are equal, round, and reactive to light.  Cardiovascular: Normal rate.   Respiratory: Effort normal.  GI: Soft.  Musculoskeletal: Normal range of  motion.  Neurological: She is alert and oriented to person, place, and time.  Skin: Skin is warm.  Psychiatric: She has a normal mood and affect. Her behavior is normal. Judgment and thought content normal.     Assessment/Plan Plan for Bilateral breast reconstruction with expanders and FlexHD placement.  [name], DO 03/14/2015, 5:29 PM

## 2015-03-17 ENCOUNTER — Telehealth: Payer: Self-pay | Admitting: *Deleted

## 2015-03-17 NOTE — H&P (Signed)
History of Present Illness  Patient words: breast eval.  The patient is a 65 year old female who presents with breast cancer. She is a 65 year old female referred by Dr. Shelah Lewandowsky for a new diagnosis of invasive ductal carcinoma of the left breast. The patient has a very significant family history of 2 daughters who have tested positive for BRCA, one of whom was diagnosed with breast cancer at age 30. The patient also has a personal history of ovarian cancer treated with surgery and chemotherapy 14 years ago with no evidence of recurrence. She has not had genetic testing. She has had routine screening mammograms. Recent screening mammogram revealed a new area of linear suspicious calcifications in the upper outer left breast measuring about 2 cm. This was associated with a possible small mass. Ultrasound was negative. She underwent stereotactic guided biopsy on January 28, 2015. This has revealed grade 2 invasive ductal carcinoma with associated DCIS. She has not noted any breast symptoms prior to her diagnosis. Specifically no lump or discharge or nipple inversion. No previous history of breast disease or surgery.  Size: 2 cm Grade: 2, Ki-67 10% Estrogen receptor positive Progesterone receptor positive HER-2 negative Lymph node status: Negative   Allergies  PenicillAMINE *ASSORTED CLASSES*  Medication History  No Current Medications Medications Reconciled  Vitals   Weight: 177 lb Height: 67in Body Surface Area: 1.92 m Body Mass Index: 27.72 kg/m  Temp.: 81F(Temporal)  Pulse: 81 (Regular)  BP: 126/86 (Sitting, Left Arm, Standard)       Physical Exam  The physical exam findings are as follows: Note:General: Alert, well-developed and well nourished Caucasian female, in no distress Skin: Warm and dry without rash or infection. HEENT: No palpable masses or thyromegaly. Sclera nonicteric. Pupils equal round and reactive. Oropharynx clear. Lymph  nodes: No cervical, supraclavicular, or inguinal nodes palpable. Breasts: There is some thickening or possible mass 2-3 cm in the upper outer quadrant of the left breast which could represent postbiopsy change. No other masses or skin changes. No palpable axillary adenopathy. Lungs: Breath sounds clear and equal. No wheezing or increased work of breathing. Cardiovascular: Regular rate and rhythm without murmer. No JVD or edema. Peripheral pulses intact.  Abdomen: Nondistended. Soft and nontender. No masses palpable. No organomegaly. No palpable hernias. Extremities: No edema or joint swelling or deformity. No chronic venous stasis changes. Neurologic: Alert and fully oriented. Gait normal. No focal weakness. Psychiatric: Normal mood and affect. Thought content appropriate with normal judgement and insi  Assessment & Plan MALIGNANT NEOPLASM OF UPPER-OUTER QUADRANT OF LEFT FEMALE BREAST (C50.412) Impression: 66 year old female with a new diagnosis of cancer of the left breast, upper outer quadrant. Clinical stage 1B, ER positive, PR positive, HER-2 negative. I discussed with the patient and her husband today initial surgical treatment options. We discussed options of breast conservation with lumpectomy or total mastectomy and sentinal lymph node biopsy/dissection. Options for reconstruction were discussed. She has 2 daughters who are BRCA positive and a history of ovarian cancer personally and is likely BRCA positive. We discussed genetic testing to predict risk of future breast cancer and she would like to proceed with this. At this point she feels that she likely would opt for bilateral mastectomy if she is genetically positive. We will refer her for genetic counseling as well as plastic surgery evaluation and also medical oncology. I will be back in touch with her as soon as we get her genetic testing results back. Current Plans Referred to Genetic Counseling,  for evaluation and follow up [facility]). Routine. Referred to Oncology, for evaluation and follow up (Oncology). Routine. Referred to Surgery - Plastic, for evaluation and follow up (Plastic Surgery). Routine. Follow Up - Call [facility] office after tests / studies done to discuss further plans Pt Education - Patient education: Genetic testing for breast and ovarian cancer (Beyond the Basics): discussed with patient and provided information.

## 2015-03-17 NOTE — Telephone Encounter (Signed)
"  I am for bilateral mastectomy tomorrow.  The surgeon's office told me to take my tamoxifen tomorrow morning before surgery.  I take mine at night before I go to bed.  Should I take the drug to the hospital and take at night, what should I do?  In the morning my stomach will be empty."    Plans to be in the Hospital for two days.  Advised she may not keep medicines at her bedside for use.  On average people take medicine during the day so it's not missed or forgotten as the day progresses.  Take her daily Tamoxifen doses no closer than twelve hours apart.  May have to take tonight's dose earlier to to be NPO by correct time for surgery.  Tamoxifen doesn't have to be taken with food.  No further questions.

## 2015-03-18 ENCOUNTER — Encounter (HOSPITAL_COMMUNITY): Payer: Self-pay | Admitting: *Deleted

## 2015-03-18 ENCOUNTER — Ambulatory Visit (HOSPITAL_COMMUNITY)
Admission: RE | Admit: 2015-03-18 | Discharge: 2015-03-18 | Disposition: A | Discharge status: Home / Self Care | Payer: BC Managed Care – PPO | Source: Ambulatory Visit | Attending: General Surgery | Admitting: General Surgery

## 2015-03-18 ENCOUNTER — Encounter (HOSPITAL_COMMUNITY)
Admission: AD | Disposition: A | Discharge status: Home / Self Care | Payer: Self-pay | Source: Ambulatory Visit | Attending: Plastic Surgery

## 2015-03-18 ENCOUNTER — Inpatient Hospital Stay (HOSPITAL_COMMUNITY)
Admission: AD | Admit: 2015-03-18 | Discharge: 2015-03-19 | DRG: 581 | Disposition: A | Discharge status: Home / Self Care | Payer: BC Managed Care – PPO | Source: Ambulatory Visit | Attending: Plastic Surgery | Admitting: Plastic Surgery

## 2015-03-18 ENCOUNTER — Inpatient Hospital Stay (HOSPITAL_COMMUNITY): Payer: BC Managed Care – PPO | Admitting: Certified Registered Nurse Anesthetist

## 2015-03-18 ENCOUNTER — Inpatient Hospital Stay (HOSPITAL_COMMUNITY): Payer: BC Managed Care – PPO | Admitting: Vascular Surgery

## 2015-03-18 DIAGNOSIS — Z800 Family history of malignant neoplasm of digestive organs: Secondary | ICD-10-CM

## 2015-03-18 DIAGNOSIS — Z1501 Genetic susceptibility to malignant neoplasm of breast: Secondary | ICD-10-CM | POA: Diagnosis not present

## 2015-03-18 DIAGNOSIS — Z8673 Personal history of transient ischemic attack (TIA), and cerebral infarction without residual deficits: Secondary | ICD-10-CM | POA: Diagnosis not present

## 2015-03-18 DIAGNOSIS — C50919 Malignant neoplasm of unspecified site of unspecified female breast: Secondary | ICD-10-CM | POA: Diagnosis present

## 2015-03-18 DIAGNOSIS — Z8543 Personal history of malignant neoplasm of ovary: Secondary | ICD-10-CM | POA: Diagnosis not present

## 2015-03-18 DIAGNOSIS — J939 Pneumothorax, unspecified: Secondary | ICD-10-CM

## 2015-03-18 DIAGNOSIS — C50412 Malignant neoplasm of upper-outer quadrant of left female breast: Secondary | ICD-10-CM | POA: Diagnosis present

## 2015-03-18 DIAGNOSIS — Z803 Family history of malignant neoplasm of breast: Secondary | ICD-10-CM

## 2015-03-18 DIAGNOSIS — Z170 Estrogen receptor positive status [ER+]: Secondary | ICD-10-CM | POA: Diagnosis not present

## 2015-03-18 HISTORY — PX: MASTECTOMY, PARTIAL: SHX709

## 2015-03-18 HISTORY — PX: MASTECTOMY W/ SENTINEL NODE BIOPSY: SHX2001

## 2015-03-18 HISTORY — PX: BREAST RECONSTRUCTION WITH PLACEMENT OF TISSUE EXPANDER AND FLEX HD (ACELLULAR HYDRATED DERMIS): SHX6295

## 2015-03-18 LAB — CBC
HCT: 39 % (ref 36.0–46.0)
Hemoglobin: 12.5 g/dL (ref 12.0–15.0)
MCH: 28.5 pg (ref 26.0–34.0)
MCHC: 32.1 g/dL (ref 30.0–36.0)
MCV: 89 fL (ref 78.0–100.0)
Platelets: 308 10*3/uL (ref 150–400)
RBC: 4.38 MIL/uL (ref 3.87–5.11)
RDW: 12.7 % (ref 11.5–15.5)
WBC: 11.4 10*3/uL — ABNORMAL HIGH (ref 4.0–10.5)

## 2015-03-18 LAB — BASIC METABOLIC PANEL
Anion gap: 9 (ref 5–15)
BUN: 9 mg/dL (ref 6–20)
CO2: 25 mmol/L (ref 22–32)
Calcium: 8.9 mg/dL (ref 8.9–10.3)
Chloride: 104 mmol/L (ref 101–111)
Creatinine, Ser: 0.83 mg/dL (ref 0.44–1.00)
GFR calc Af Amer: >60 mL/min (ref >60)
GFR calc non Af Amer: >60 mL/min (ref >60)
Glucose, Bld: 170 mg/dL — ABNORMAL HIGH (ref 65–99)
Potassium: 4.3 mmol/L (ref 3.5–5.1)
Sodium: 138 mmol/L (ref 135–145)

## 2015-03-18 SURGERY — MASTECTOMY WITH SENTINEL LYMPH NODE BIOPSY
Anesthesia: General | Site: Chest | Laterality: Right

## 2015-03-18 MED ORDER — LIDOCAINE HCL (CARDIAC) 20 MG/ML IV SOLN
INTRAVENOUS | Status: AC
  Filled 2015-03-18: qty 5

## 2015-03-18 MED ORDER — GLYCOPYRROLATE 0.2 MG/ML IJ SOLN
INTRAMUSCULAR | Status: DC | PRN
  Administered 2015-03-18: .6 mg via INTRAVENOUS

## 2015-03-18 MED ORDER — MORPHINE SULFATE (PF) 2 MG/ML IV SOLN
2.0000 mg | INTRAVENOUS | Status: DC | PRN
Start: 2015-03-18 — End: 2015-03-19

## 2015-03-18 MED ORDER — CHLORHEXIDINE GLUCONATE 4 % EX LIQD: 1.0000 "application " | Freq: Once | CUTANEOUS | Status: DC

## 2015-03-18 MED ORDER — ACETAMINOPHEN 500 MG PO TABS
1000.0000 mg | ORAL_TABLET | Freq: Four times a day (QID) | ORAL | Status: DC
  Administered 2015-03-18 – 2015-03-19 (×3): 1000 mg via ORAL
  Filled 2015-03-18 (×3): qty 2

## 2015-03-18 MED ORDER — DOCUSATE SODIUM 100 MG PO CAPS
100.0000 mg | ORAL_CAPSULE | Freq: Two times a day (BID) | ORAL | Status: DC
  Administered 2015-03-18 – 2015-03-19 (×2): 100 mg via ORAL
  Filled 2015-03-18 (×2): qty 1

## 2015-03-18 MED ORDER — INFLUENZA VAC SPLIT QUAD 0.5 ML IM SUSY
0.5000 mL | PREFILLED_SYRINGE | INTRAMUSCULAR | Status: AC
  Administered 2015-03-19: 0.5 mL via INTRAMUSCULAR
  Filled 2015-03-18: qty 0.5

## 2015-03-18 MED ORDER — NAPROXEN 250 MG PO TABS: 500.0000 mg | ORAL_TABLET | Freq: Two times a day (BID) | ORAL | Status: DC | PRN

## 2015-03-18 MED ORDER — HYDROMORPHONE HCL 1 MG/ML IJ SOLN
INTRAMUSCULAR | Status: AC
  Filled 2015-03-18: qty 1

## 2015-03-18 MED ORDER — ONDANSETRON 4 MG PO TBDP: 4.0000 mg | ORAL_TABLET | Freq: Four times a day (QID) | ORAL | Status: DC | PRN

## 2015-03-18 MED ORDER — HYDROCODONE-ACETAMINOPHEN 5-325 MG PO TABS
1.0000 | ORAL_TABLET | ORAL | Status: DC | PRN
  Administered 2015-03-18 – 2015-03-19 (×2): 2 via ORAL
  Filled 2015-03-18 (×2): qty 2

## 2015-03-18 MED ORDER — POLYETHYLENE GLYCOL 3350 17 G PO PACK: 17.0000 g | PACK | Freq: Every day | ORAL | Status: DC | PRN

## 2015-03-18 MED ORDER — HYDROMORPHONE HCL 1 MG/ML IJ SOLN
0.2500 mg | INTRAMUSCULAR | Status: DC | PRN
  Administered 2015-03-18: 0.5 mg via INTRAVENOUS

## 2015-03-18 MED ORDER — KCL IN DEXTROSE-NACL 20-5-0.45 MEQ/L-%-% IV SOLN
INTRAVENOUS | Status: DC
  Administered 2015-03-18 – 2015-03-19 (×2): via INTRAVENOUS
  Filled 2015-03-18 (×2): qty 1000

## 2015-03-18 MED ORDER — GLYCOPYRROLATE 0.2 MG/ML IJ SOLN
INTRAMUSCULAR | Status: AC
  Filled 2015-03-18: qty 3

## 2015-03-18 MED ORDER — DIAZEPAM 2 MG PO TABS: 2.0000 mg | ORAL_TABLET | Freq: Two times a day (BID) | ORAL | Status: DC | PRN

## 2015-03-18 MED ORDER — PROPOFOL 10 MG/ML IV BOLUS
INTRAVENOUS | Status: DC | PRN
  Administered 2015-03-18: 160 mg via INTRAVENOUS
  Administered 2015-03-18: 20 mg via INTRAVENOUS

## 2015-03-18 MED ORDER — PROPOFOL 10 MG/ML IV BOLUS
INTRAVENOUS | Status: AC
  Filled 2015-03-18: qty 40

## 2015-03-18 MED ORDER — MIDAZOLAM HCL 2 MG/2ML IJ SOLN
INTRAMUSCULAR | Status: AC
  Filled 2015-03-18: qty 2

## 2015-03-18 MED ORDER — PHENYLEPHRINE HCL 10 MG/ML IJ SOLN
10.0000 mg | INTRAVENOUS | Status: DC | PRN
  Administered 2015-03-18: 25 ug/min via INTRAVENOUS

## 2015-03-18 MED ORDER — PHENYLEPHRINE 40 MCG/ML (10ML) SYRINGE FOR IV PUSH (FOR BLOOD PRESSURE SUPPORT)
PREFILLED_SYRINGE | INTRAVENOUS | Status: AC
  Filled 2015-03-18: qty 10

## 2015-03-18 MED ORDER — TECHNETIUM TC 99M SULFUR COLLOID FILTERED
1.0000 | Freq: Once | INTRAVENOUS | Status: AC | PRN
  Administered 2015-03-18: 1 via INTRADERMAL

## 2015-03-18 MED ORDER — LIDOCAINE HCL (CARDIAC) 20 MG/ML IV SOLN
INTRAVENOUS | Status: DC | PRN
  Administered 2015-03-18: 100 mg via INTRAVENOUS

## 2015-03-18 MED ORDER — FENTANYL CITRATE (PF) 250 MCG/5ML IJ SOLN
INTRAMUSCULAR | Status: AC
  Filled 2015-03-18: qty 5

## 2015-03-18 MED ORDER — PHENYLEPHRINE HCL 10 MG/ML IJ SOLN
INTRAMUSCULAR | Status: DC | PRN
  Administered 2015-03-18 (×2): 40 ug via INTRAVENOUS
  Administered 2015-03-18: 80 ug via INTRAVENOUS

## 2015-03-18 MED ORDER — FENTANYL CITRATE (PF) 100 MCG/2ML IJ SOLN
INTRAMUSCULAR | Status: AC
  Administered 2015-03-18: 100 ug
  Filled 2015-03-18: qty 2

## 2015-03-18 MED ORDER — SODIUM CHLORIDE 0.9 % IJ SOLN
INTRAMUSCULAR | Status: AC
  Filled 2015-03-18: qty 10

## 2015-03-18 MED ORDER — MIDAZOLAM HCL 2 MG/2ML IJ SOLN
INTRAMUSCULAR | Status: AC
  Administered 2015-03-18: 2 mg
  Filled 2015-03-18: qty 2

## 2015-03-18 MED ORDER — LACTATED RINGERS IV SOLN
INTRAVENOUS | Status: DC
  Administered 2015-03-18 (×3): via INTRAVENOUS

## 2015-03-18 MED ORDER — 0.9 % SODIUM CHLORIDE (POUR BTL) OPTIME
TOPICAL | Status: DC | PRN
  Administered 2015-03-18 (×2): 1000 mL

## 2015-03-18 MED ORDER — BUPIVACAINE-EPINEPHRINE (PF) 0.25% -1:200000 IJ SOLN
INTRAMUSCULAR | Status: DC | PRN
  Administered 2015-03-18: 30 mL

## 2015-03-18 MED ORDER — NEOSTIGMINE METHYLSULFATE 10 MG/10ML IV SOLN
INTRAVENOUS | Status: DC | PRN
  Administered 2015-03-18: 4 mg via INTRAVENOUS

## 2015-03-18 MED ORDER — TAMOXIFEN CITRATE 10 MG PO TABS
20.0000 mg | ORAL_TABLET | Freq: Every day | ORAL | Status: DC
Start: 2015-03-19 — End: 2015-03-19
  Administered 2015-03-19: 20 mg via ORAL
  Filled 2015-03-18 (×2): qty 2

## 2015-03-18 MED ORDER — ROCURONIUM BROMIDE 100 MG/10ML IV SOLN
INTRAVENOUS | Status: DC | PRN
  Administered 2015-03-18 (×3): 10 mg via INTRAVENOUS
  Administered 2015-03-18: 30 mg via INTRAVENOUS
  Administered 2015-03-18: 20 mg via INTRAVENOUS
  Administered 2015-03-18: 40 mg via INTRAVENOUS

## 2015-03-18 MED ORDER — PROMETHAZINE HCL 25 MG/ML IJ SOLN: 6.2500 mg | INTRAMUSCULAR | Status: DC | PRN

## 2015-03-18 MED ORDER — ROCURONIUM BROMIDE 50 MG/5ML IV SOLN
INTRAVENOUS | Status: AC
  Filled 2015-03-18: qty 2

## 2015-03-18 MED ORDER — FENTANYL CITRATE (PF) 100 MCG/2ML IJ SOLN
INTRAMUSCULAR | Status: DC | PRN
  Administered 2015-03-18: 25 ug via INTRAVENOUS
  Administered 2015-03-18 (×3): 50 ug via INTRAVENOUS
  Administered 2015-03-18: 25 ug via INTRAVENOUS

## 2015-03-18 MED ORDER — CIPROFLOXACIN IN D5W 400 MG/200ML IV SOLN
400.0000 mg | INTRAVENOUS | Status: AC
  Administered 2015-03-18: 400 mg via INTRAVENOUS
  Filled 2015-03-18: qty 200

## 2015-03-18 MED ORDER — SODIUM CHLORIDE 0.9 % IR SOLN
Status: DC | PRN
  Administered 2015-03-18: 1000 mL

## 2015-03-18 MED ORDER — NEOSTIGMINE METHYLSULFATE 10 MG/10ML IV SOLN
INTRAVENOUS | Status: AC
  Filled 2015-03-18: qty 1

## 2015-03-18 MED ORDER — DEXAMETHASONE SODIUM PHOSPHATE 10 MG/ML IJ SOLN
INTRAMUSCULAR | Status: DC | PRN
  Administered 2015-03-18: 10 mg via INTRAVENOUS

## 2015-03-18 MED ORDER — ONDANSETRON HCL 4 MG/2ML IJ SOLN
INTRAMUSCULAR | Status: DC | PRN
  Administered 2015-03-18: 4 mg via INTRAVENOUS

## 2015-03-18 MED ORDER — CIPROFLOXACIN IN D5W 400 MG/200ML IV SOLN
400.0000 mg | Freq: Two times a day (BID) | INTRAVENOUS | Status: DC
Start: 2015-03-18 — End: 2015-03-19
  Administered 2015-03-18 – 2015-03-19 (×2): 400 mg via INTRAVENOUS
  Filled 2015-03-18 (×4): qty 200

## 2015-03-18 MED ORDER — SODIUM CHLORIDE 0.9 % IR SOLN
Status: DC | PRN
  Administered 2015-03-18: 500 mL

## 2015-03-18 MED ORDER — ONDANSETRON HCL 4 MG/2ML IJ SOLN: 4.0000 mg | Freq: Four times a day (QID) | INTRAMUSCULAR | Status: DC | PRN

## 2015-03-18 MED ORDER — DEXAMETHASONE SODIUM PHOSPHATE 10 MG/ML IJ SOLN
INTRAMUSCULAR | Status: AC
  Filled 2015-03-18: qty 1

## 2015-03-18 SURGICAL SUPPLY — 88 items
BAG DECANTER FOR FLEXI CONT (MISCELLANEOUS) ×5 IMPLANT
BINDER BREAST LRG (GAUZE/BANDAGES/DRESSINGS) ×5 IMPLANT
BINDER BREAST XLRG (GAUZE/BANDAGES/DRESSINGS) IMPLANT
BIOPATCH RED 1 DISK 7.0 (GAUZE/BANDAGES/DRESSINGS) ×8 IMPLANT
BIOPATCH RED 1IN DISK 7.0MM (GAUZE/BANDAGES/DRESSINGS) ×2
CANISTER SUCTION 2500CC (MISCELLANEOUS) ×15 IMPLANT
CHLORAPREP W/TINT 26ML (MISCELLANEOUS) ×5 IMPLANT
CLIP TI MEDIUM 6 (CLIP) IMPLANT
CONT SPEC 4OZ CLIKSEAL STRL BL (MISCELLANEOUS) ×5 IMPLANT
COVER PROBE W GEL 5X96 (DRAPES) ×5 IMPLANT
COVER SURGICAL LIGHT HANDLE (MISCELLANEOUS) ×5 IMPLANT
DECANTER SPIKE VIAL GLASS SM (MISCELLANEOUS) IMPLANT
DERMABOND ADVANCED (GAUZE/BANDAGES/DRESSINGS) ×4
DERMABOND ADVANCED .7 DNX12 (GAUZE/BANDAGES/DRESSINGS) ×6 IMPLANT
DEVICE DISSECT PLASMABLAD 3.0S (MISCELLANEOUS) ×3 IMPLANT
DRAIN CHANNEL 19F RND (DRAIN) ×10 IMPLANT
DRAPE CHEST BREAST 15X10 FENES (DRAPES) IMPLANT
DRAPE ORTHO SPLIT 77X108 STRL (DRAPES) ×4
DRAPE PROXIMA HALF (DRAPES) IMPLANT
DRAPE SURG 17X23 STRL (DRAPES) ×20 IMPLANT
DRAPE SURG ORHT 6 SPLT 77X108 (DRAPES) ×6 IMPLANT
DRAPE UTILITY XL STRL (DRAPES) ×5 IMPLANT
DRAPE WARM FLUID 44X44 (DRAPE) IMPLANT
ELECT BLADE 4.0 EZ CLEAN MEGAD (MISCELLANEOUS) ×5
ELECT CAUTERY BLADE 6.4 (BLADE) ×5 IMPLANT
ELECT COATED BLADE 2.86 ST (ELECTRODE) IMPLANT
ELECT REM PT RETURN 9FT ADLT (ELECTROSURGICAL) ×10
ELECTRODE BLDE 4.0 EZ CLN MEGD (MISCELLANEOUS) ×3 IMPLANT
ELECTRODE REM PT RTRN 9FT ADLT (ELECTROSURGICAL) ×6 IMPLANT
EVACUATOR SILICONE 100CC (DRAIN) ×10 IMPLANT
GAUZE SPONGE 4X4 12PLY STRL (GAUZE/BANDAGES/DRESSINGS) IMPLANT
GLOVE BIO SURGEON STRL SZ 6.5 (GLOVE) ×24 IMPLANT
GLOVE BIO SURGEON STRL SZ7 (GLOVE) ×20 IMPLANT
GLOVE BIO SURGEONS STRL SZ 6.5 (GLOVE) ×6
GLOVE BIOGEL PI IND STRL 7.0 (GLOVE) ×6 IMPLANT
GLOVE BIOGEL PI IND STRL 8 (GLOVE) ×3 IMPLANT
GLOVE BIOGEL PI INDICATOR 7.0 (GLOVE) ×4
GLOVE BIOGEL PI INDICATOR 8 (GLOVE) ×2
GLOVE ECLIPSE 7.5 STRL STRAW (GLOVE) ×10 IMPLANT
GLOVE SURG SS PI 7.0 STRL IVOR (GLOVE) ×10 IMPLANT
GOWN STRL REUS W/ TWL LRG LVL3 (GOWN DISPOSABLE) ×15 IMPLANT
GOWN STRL REUS W/ TWL XL LVL3 (GOWN DISPOSABLE) ×3 IMPLANT
GOWN STRL REUS W/TWL LRG LVL3 (GOWN DISPOSABLE) ×10
GOWN STRL REUS W/TWL XL LVL3 (GOWN DISPOSABLE) ×2
GRAFT FLEX HD BILAT 4X16 THICK (Tissue Mesh) ×10 IMPLANT
IMPL BREAST TIS EXP M 350CC (Breast) ×6 IMPLANT
IMPLANT BREAST TIS EXP M 350CC (Breast) ×10 IMPLANT
KIT BASIN OR (CUSTOM PROCEDURE TRAY) ×5 IMPLANT
KIT ROOM TURNOVER OR (KITS) ×5 IMPLANT
LIQUID BAND (GAUZE/BANDAGES/DRESSINGS) IMPLANT
NEEDLE 18GX1X1/2 (RX/OR ONLY) (NEEDLE) IMPLANT
NEEDLE 22X1 1/2 (OR ONLY) (NEEDLE) IMPLANT
NEEDLE HYPO 25GX1X1/2 BEV (NEEDLE) IMPLANT
NS IRRIG 1000ML POUR BTL (IV SOLUTION) ×5 IMPLANT
PACK GENERAL/GYN (CUSTOM PROCEDURE TRAY) ×5 IMPLANT
PACK SURGICAL SETUP 50X90 (CUSTOM PROCEDURE TRAY) IMPLANT
PAD ABD 8X10 STRL (GAUZE/BANDAGES/DRESSINGS) ×20 IMPLANT
PAD ARMBOARD 7.5X6 YLW CONV (MISCELLANEOUS) ×10 IMPLANT
PENCIL BUTTON HOLSTER BLD 10FT (ELECTRODE) IMPLANT
PIN SAFETY STERILE (MISCELLANEOUS) IMPLANT
PLASMABLADE 3.0S (MISCELLANEOUS) ×5
SET ASEPTIC TRANSFER (MISCELLANEOUS) ×5 IMPLANT
SPECIMEN JAR X LARGE (MISCELLANEOUS) ×10 IMPLANT
SPONGE LAP 18X18 X RAY DECT (DISPOSABLE) ×15 IMPLANT
STAPLER VISISTAT 35W (STAPLE) ×5 IMPLANT
SUT ETHILON 2 0 FS 18 (SUTURE) IMPLANT
SUT MNCRL AB 4-0 PS2 18 (SUTURE) ×15 IMPLANT
SUT MON AB 3-0 SH 27 (SUTURE) ×6
SUT MON AB 3-0 SH27 (SUTURE) ×9 IMPLANT
SUT MON AB 5-0 PS2 18 (SUTURE) ×15 IMPLANT
SUT PDS AB 2-0 CT1 27 (SUTURE) ×30 IMPLANT
SUT SILK 3 0 SH 30 (SUTURE) IMPLANT
SUT SILK 4 0 PS 2 (SUTURE) ×10 IMPLANT
SUT VIC AB 3-0 54X BRD REEL (SUTURE) ×3 IMPLANT
SUT VIC AB 3-0 BRD 54 (SUTURE) ×2
SUT VIC AB 3-0 SH 18 (SUTURE) ×10 IMPLANT
SUT VIC AB 3-0 SH 27 (SUTURE)
SUT VIC AB 3-0 SH 27XBRD (SUTURE) IMPLANT
SYR 20CC LL (SYRINGE) ×5 IMPLANT
SYR BULB 3OZ (MISCELLANEOUS) IMPLANT
SYR BULB IRRIGATION 50ML (SYRINGE) ×5 IMPLANT
SYR CONTROL 10ML LL (SYRINGE) IMPLANT
TOWEL OR 17X24 6PK STRL BLUE (TOWEL DISPOSABLE) IMPLANT
TOWEL OR 17X26 10 PK STRL BLUE (TOWEL DISPOSABLE) ×5 IMPLANT
TRAY FOLEY CATH 14FRSI W/METER (CATHETERS) ×5 IMPLANT
TUBE CONNECTING 12'X1/4 (SUCTIONS) ×1
TUBE CONNECTING 12X1/4 (SUCTIONS) ×4 IMPLANT
YANKAUER SUCT BULB TIP NO VENT (SUCTIONS) IMPLANT

## 2015-03-18 NOTE — Progress Notes (Signed)
Report given to steve shaw rn as caregiver 

## 2015-03-18 NOTE — Transfer of Care (Signed)
Immediate Anesthesia Transfer of Care Note  Patient: Vicki Hale  Procedure(s) Performed: Procedure(s): LEFT TOTAL MASTECTOMY WITH LEFT AXILLARY SENTINEL LYMPH NODE BIOPSY (Left) RIGHT PROPHYLACTIC MASTECTOMY  (Right) BILATERAL BREAST RECONSTRUCTION WITH PLACEMENT OF TISSUE EXPANDER AND FLEX HD (ACELLULAR HYDRATED DERMIS) (Bilateral)  Patient Location: PACU  Anesthesia Type:General  Level of Consciousness: oriented, patient cooperative and responds to stimulation, drowsy  Airway & Oxygen Therapy: Patient Spontanous Breathing and Patient connected to nasal cannula oxygen  Post-op Assessment: Report given to RN and Post -op Vital signs reviewed and stable  Post vital signs: Reviewed and stable  Last Vitals:  Filed Vitals:   03/18/15 0902  BP: 132/63  Pulse: 64  Temp: 36.3 C  Resp: 18    Complications: No apparent anesthesia complications

## 2015-03-18 NOTE — H&P (View-Only) (Signed)
Vicki Hale is an 65 y.o. female.   Chief Complaint: Left breast cancer HPI: The patient is a 66 yrs old wf here for a history and physical for breast reconstruction. She is well known to the practice because her daughter was treated for BRCA positive disease. The patient went for a routine mammogram and was found to have calcifications of the LEFT upper outer quadrant. Biopsy revealed invasive ductal carcionoma, Grade 2, ER/PR positive and HER2 negative. She is being seen by Dr. Excell Seltzer. She was treated for ovarian cancer by a hysterectomy and salping-ooopherectomy in 2003. She is 5 feet 7 inches tall, weights 168 pounds, Preop bra = 38C. She is interested in bilateral mastectomies. BRAC testing pending. She is healthy and in reasonable physical shape. She has mild Grade II ptosis of her breasts as expected with breast feeding two children. Both daughters are BRCA positive. She is ok with not trying to save the NAC due to the ptosis. The sternal notch to the NAC rightt = 30 cm and left = 28 cm, IMF 9 cm.  Past Medical History  Diagnosis Date  . Ovarian cancer ([place])   . Stroke ([place])   . Breast cancer ([place]) 2017    [facility] in [place]  . Family history of breast cancer   . FH: BRCA1 gene positive   . Family history of pancreatic cancer   . SAH (subarachnoid hemorrhage) ([place])     04/2014  . Pericarditis, acute     30 YRS AGO   . Arthritis     Past Surgical History  Procedure Laterality Date  . Tonsillectomy    . Hernia repair    . Total abdominal hysterectomy w/ bilateral salpingoophorectomy    . Cesarean section      X2    Family History  Problem Relation Age of Onset  . Colon polyps Mother   . Colon cancer Neg Hx   . Rectal cancer Neg Hx   . Ulcerative colitis Neg Hx   . Stomach cancer Neg Hx   . Breast cancer Daughter 18  . BRCA 1/2 Daughter     BRCA1  . COPD Maternal Aunt   . Esophageal cancer Maternal Uncle   . Lung cancer Paternal Aunt   . Pancreatic cancer Maternal  Grandmother     early 48s  . Prostate cancer Maternal Grandfather     40s  . Skin cancer Paternal Grandmother     untreated BCC  . COPD Maternal Aunt   . Lung cancer Maternal Aunt   . Breast cancer Cousin 61    maternal first cousin  . BRCA 1/2 Daughter     BRCA1  . Esophageal cancer Cousin     maternal first cousin  . Bladder Cancer Paternal Aunt    Social History:  reports that she has never smoked. She has never used smokeless tobacco. She reports that she drinks alcohol. She reports that she does not use illicit drugs.  Allergies:  Allergies  Allergen Reactions  . Penicillins Itching and Rash    Has patient had a PCN reaction causing immediate rash, facial/tongue/throat swelling, SOB or lightheadedness with hypotension: No Has patient had a PCN reaction causing severe rash involving mucus membranes or skin necrosis: No Has patient had a PCN reaction that required hospitalization No Has patient had a PCN reaction occurring within the last 10 years: No If all of the above answers are "NO", then may proceed with Cephalosporin use.      (Not  in a hospital admission)  No results found for this or any previous visit (from the past 48 hour(s)). No results found.  Review of Systems  Constitutional: Negative.   HENT: Negative.   Eyes: Negative.   Respiratory: Negative.   Cardiovascular: Negative.   Gastrointestinal: Negative.   Genitourinary: Negative.   Musculoskeletal: Negative.   Skin: Negative.   Neurological: Negative.   Psychiatric/Behavioral: Negative.     There were no vitals taken for this visit. Physical Exam  Constitutional: She is oriented to person, place, and time. She appears well-developed and well-nourished.  HENT:  Head: Normocephalic and atraumatic.  Eyes: Conjunctivae and EOM are normal. Pupils are equal, round, and reactive to light.  Cardiovascular: Normal rate.   Respiratory: Effort normal.  GI: Soft.  Musculoskeletal: Normal range of  motion.  Neurological: She is alert and oriented to person, place, and time.  Skin: Skin is warm.  Psychiatric: She has a normal mood and affect. Her behavior is normal. Judgment and thought content normal.     Assessment/Plan Plan for Bilateral breast reconstruction with expanders and FlexHD placement.  [name], DO 03/14/2015, 5:29 PM

## 2015-03-18 NOTE — Op Note (Signed)
Preoperative Diagnosis: cancer left breast  Postoprative Diagnosis: cancer left breast  Procedure: Procedure(s): LEFT TOTAL SKIN SPARING MASTECTOMY WITH LEFT AXILLARY SENTINEL LYMPH NODE BIOPSY RIGHT PROPHYLACTIC SKIN SPARING MASTECTOMY     Surgeon: Excell Seltzer T   Assistants: Audelia Hives  Anesthesia:  General endotracheal anesthesia  Indications: Patient is a 65 year old female with recently diagnosed stage I cancer of the left breast. In addition she has been found to be BRCA positive and desires prophylactic mastectomy. After extensive discussion regarding the surgery and alternatives and risks detailed elsewhere we have elected to proceed with left total mastectomy with axillary sentinel lymph node biopsy and prophylactic right total mastectomy.    Procedure Detail:  Preoperatively in the holding area the patient underwent pectoral block by anesthesia and underwent injection of 1 mCi of technetium sulfur colloid intradermally around the left nipple. She was brought to the operating room, placed in supine position on the operating table, and general endotracheal anesthesia induced. Her arms carefully extended the entire anterior chest and neck and upper arms were widely sterilely prepped and draped. Foley catheter had been placed. Patient timeout was performed and procedure verified. She received preoperative IV antibiotics. PA S in place. The right prophylactic side was approached initially. A skin sparing approach was used with a minimal elliptical transverse incision encompassing the nipple areolar complex and extended slightly medially and laterally. Following this skin and subcutaneous flaps were developed with the plasma blade working superiorly toward the clavicle, inferiorly to the inframammary crease, medially to the edge of the sternum and laterally out toward the anterior border of the latissimus dorsi. Dissection was deepened down to the chest wall. Following this the  breast tissue was reflected up off the chest wall with the plasma blade working medial to lateral. Small perforators were controlled with the plasma blade. The specimen was reflected laterally off the edge of the pectoralis and off of the serratus. The anterior border of latissimus was carefully defined and the specimen dissected up off at the anterior border of latissimus working superiorly toward the axilla. Final specimen was mobilized down to the low axilla. I came across the low axilla at this point with clamps and the specimen was removed and oriented and sent for permanent pathology. The axillar tissue was tied with 3-0 Vicryl. The flaps appeared uniform and viable. The wound was thoroughly irrigated and hemostasis assured. Moist gauze pack was placed. Attention was then turned to the left therapeutic mastectomy. An identical incision and dissection was performed here. As we came to the axilla the neoprobe was used to identify definite hot node. It was dissected free and removed intact. It was soft and slightly enlarged. Ex vivo the node had counts in excess of 1000 and background in the entire axilla at this point was 0 with no counts in the Rose no palpable adenopathy. This was sent as hot left axillary sentinel lymph node. At this point I came across the low axilla again with clamps identically to the other side of the specimen was oriented and sent for permanent pathology. The wound was irrigated and hemostasis assured. Dr. Marla Roe then proceeded with planned reconstruction.    Findings: As above  Estimated Blood Loss:  less than 100 mL         Drains: per Dr. Marla Roe  Blood Given: none          Specimens: #1 right total mastectomy (prophylactic)   #2 left total mastectomy (therapeutic)    #3 left axillary sentinel lymph  node        Complications:  * No complications entered in OR log *         Disposition: PACU - hemodynamically stable.         Condition: stable

## 2015-03-18 NOTE — Interval H&P Note (Signed)
History and Physical Interval Note:  [id number] AM  Vicki Hale  has presented today for surgery, with the diagnosis of LEFT BREAST CANCER  The various methods of treatment have been discussed with the patient and family. After consideration of risks, benefits and other options for treatment, the patient has consented to  Procedure(s): LEFT TOTAL MASTECTOMY WITH SENTINEL LYMPH NODE BIOPSY (Left) RIGHT PROPHYLACTIC MASTECTOMY  (Right) BREAST RECONSTRUCTION WITH PLACEMENT OF TISSUE EXPANDER AND FLEX HD (ACELLULAR HYDRATED DERMIS) (Bilateral) as a surgical intervention .  The patient's history has been reviewed, patient examined, no change in status, stable for surgery.  I have reviewed the patient's chart and labs.  Questions were answered to the patient's satisfaction.     Shae Hinnenkamp T

## 2015-03-18 NOTE — Interval H&P Note (Signed)
History and Physical Interval Note:  [id number] AM  Vicki Hale  has presented today for surgery, with the diagnosis of LEFT BREAST CANCER  The various methods of treatment have been discussed with the patient and family. After consideration of risks, benefits and other options for treatment, the patient has consented to  Procedure(s): LEFT TOTAL MASTECTOMY WITH LEFT AXILLARY SENTINEL LYMPH NODE BIOPSY (Left) RIGHT PROPHYLACTIC MASTECTOMY  (Right) BILATERAL BREAST RECONSTRUCTION WITH PLACEMENT OF TISSUE EXPANDER AND FLEX HD (ACELLULAR HYDRATED DERMIS) (Bilateral) as a surgical intervention .  The patient's history has been reviewed, patient examined, no change in status, stable for surgery.  I have reviewed the patient's chart and labs.  Questions were answered to the patient's satisfaction.     [name]

## 2015-03-18 NOTE — Op Note (Signed)
Op report    DATE OF OPERATION:  03/18/2015  LOCATION: [facility]  SURGICAL DIVISION: Plastic Surgery  PREOPERATIVE DIAGNOSES:  1. Left Breast cancer.    POSTOPERATIVE DIAGNOSES:  1. Left Breast cancer.   PROCEDURE:  1. Bilateral immediate breast reconstruction with placement of Acellular Dermal Matrix and tissue expanders.  SURGEON: Claire Sanger Dillingham, DO  ASSISTANT: Shawn Rayburn, PA  ANESTHESIA:  General.   COMPLICATIONS: None.   IMPLANTS: Left - Mentor 350 cc with 250 cc normal saline placed Right - Mentor 350 cc with 250 cc normal saline placed Acellular Dermal Matrix 4 x 16 cm  INDICATIONS FOR PROCEDURE:  The patient, Vicki Hale, is a 65 y.o. female born on Jan 23, 1950, is here for  immediate first stage breast reconstruction with placement of bilateral tissue expander and Acellular dermal matrix. MRN: [id number]  CONSENT:  Informed consent was obtained directly from the patient. Risks, benefits and alternatives were fully discussed. Specific risks including but not limited to bleeding, infection, hematoma, seroma, scarring, pain, implant infection, implant extrusion, capsular contracture, asymmetry, wound healing problems, and need for further surgery were all discussed. The patient did have an ample opportunity to have her questions answered to her satisfaction.   DESCRIPTION OF PROCEDURE:  The patient was taken to the operating room by the general surgery team. SCDs were placed and IV antibiotics were given. The patient's chest was prepped and draped in a sterile fashion. A time out was performed and the implants to be used were identified.  Bilateral mastectomies were performed.  Once the general surgery team had completed their portion of the case the patient was rendered to the plastic and reconstructive surgery team.  Right.  The pectoralis major muscle was lifted from the chest wall with release of the lateral edge and lateral inframammary  fold.  The pocket was irrigated with antibiotic solution and hemostasis was achieved with electrocautery.  The ADM was then prepared according to the manufacture guidelines and slits placed to help with postoperative fluid management.  The ADM was then sutured to the inferior and lateral edge of the inframammary fold with 2-0 PDS starting with an interrupted stitch and then a running stitch.  The lateral portion was sutured to with interrupted sutures after the expander was placed.  The expander was prepared according to the manufacture guidelines, the air evacuated and then it was placed under the ADM and pectoralis major muscle.  The inferior and lateral tabs were used to secure the expander to the chest wall with 2-0 PDS.  The drain was placed at the inframammary fold over the ADM and secured to the skin with 3-0 Silk.  The deep layers were closed with 3-0 Vicryl followed by 4-0 Monocryl.  The skin was closed with 5-0 Monocryl and then dermabond was applied.    Left.  The pectoralis major muscle was lifted from the chest wall with release of the lateral edge and lateral inframammary fold.  The pocket was irrigated with antibiotic solution and hemostasis was achieved with electrocautery.  The ADM was then prepared according to the manufacture guidelines and slits placed to help with postoperative fluid management.  The ADM was then sutured to the inferior and lateral edge of the inframammary fold with 2-0 PDS starting with an interrupted stitch and then a running stitch.  The lateral portion was sutured to with interrupted sutures after the expander was placed.  The expander was prepared according to the manufacture guidelines, the air evacuated and  then it was placed under the ADM and pectoralis major muscle.  The inferior and lateral tabs were used to secure the expander to the chest wall with 2-0 PDS.  The drain was placed at the inframammary fold over the ADM and secured to the skin with 3-0 Silk.    The  deep layers were closed with 3-0 Vicryl followed by 4-0 Monocryl.  The skin was closed with 5-0 Monocryl and then dermabond was applied.   The ABDs and breast binder were placed.  The patient tolerated the procedure well and there were no complications.  The patient was allowed to wake from anesthesia and taken to the recovery room in satisfactory condition.

## 2015-03-18 NOTE — Anesthesia Postprocedure Evaluation (Signed)
Anesthesia Post Note  Patient: Vicki Hale  Procedure(s) Performed: Procedure(s) (LRB): LEFT TOTAL MASTECTOMY WITH LEFT AXILLARY SENTINEL LYMPH NODE BIOPSY (Left) RIGHT PROPHYLACTIC MASTECTOMY  (Right) BILATERAL BREAST RECONSTRUCTION WITH PLACEMENT OF TISSUE EXPANDER AND FLEX HD (ACELLULAR HYDRATED DERMIS) (Bilateral)  Patient location during evaluation: PACU Anesthesia Type: General and Regional Level of consciousness: awake and alert Pain management: pain level controlled Vital Signs Assessment: post-procedure vital signs reviewed and stable Respiratory status: spontaneous breathing, nonlabored ventilation, respiratory function stable and patient connected to nasal cannula oxygen Cardiovascular status: blood pressure returned to baseline and stable Postop Assessment: no signs of nausea or vomiting Anesthetic complications: no    Last Vitals:  Filed Vitals:   03/18/15 0902 03/18/15 1420  BP: 132/63 144/73  Pulse: 64 73  Temp: 36.3 C 36.3 C  Resp: 18 14    Last Pain:  Filed Vitals:   03/18/15 1435  PainSc: 0-No pain                 Juvencio Verdi S

## 2015-03-18 NOTE — Anesthesia Procedure Notes (Addendum)
Anesthesia Regional Block:  Pectoralis block  Pre-Anesthetic Checklist: ,, timeout performed, Correct Patient, Correct Site, Correct Laterality, Correct Procedure, Correct Position, site marked, Risks and benefits discussed,  Surgical consent,  Pre-op evaluation,  At surgeon's request and post-op pain management  Laterality: Right and Upper  Prep: chloraprep       Needles:   Needle Type: Echogenic Stimulator Needle     Needle Length: 9cm 9 cm Needle Gauge: 22 and 22 G  Needle insertion depth: 5 cm   Additional Needles:  Procedures: ultrasound guided (picture in chart) Pectoralis block Narrative:  Start time: 03/18/2015 9:50 AM End time: 03/18/2015 10:00 AM Injection made incrementally with aspirations every 5 mL.  Performed by: Personally  Anesthesiologist: MASSAG[place], TERRY   Anesthesia Regional Block:  Pectoralis block  Pre-Anesthetic Checklist: ,, timeout performed, Correct Patient, Correct Site, Correct Laterality, Correct Procedure, Correct Position, site marked, Risks and benefits discussed,  Surgical consent,  Pre-op evaluation,  At surgeon's request and post-op pain management  Laterality: Left and Upper  Prep: chloraprep       Needles:   Needle Type: Echogenic Stimulator Needle     Needle Length: 9cm 9 cm Needle Gauge: 22 and 22 G  Needle insertion depth: 5 cm   Additional Needles:  Procedures: ultrasound guided (picture in chart) Pectoralis block Narrative:  Start time: 03/18/2015 10:00 AM End time: 03/18/2015 10:15 AM Injection made incrementally with aspirations every 5 mL.  Performed by: Personally  Anesthesiologist: Rica Koyanagi   Procedure Name: Intubation Date/Time: 03/18/2015 11:03 AM Performed by: Merdis Delay Pre-anesthesia Checklist: Patient identified, Timeout performed, Suction available, Emergency Drugs available and Patient being monitored Patient Re-evaluated:Patient Re-evaluated prior to inductionOxygen Delivery Method: Circle  system utilized Preoxygenation: Pre-oxygenation with 100% oxygen Intubation Type: IV induction Ventilation: Mask ventilation without difficulty Laryngoscope Size: Mac and 3 Grade View: Grade I Tube type: Oral Tube size: 7.5 mm Number of attempts: 1 Airway Equipment and Method: Stylet Placement Confirmation: ETT inserted through vocal cords under direct vision,  breath sounds checked- equal and bilateral,  positive ETCO2 and CO2 detector Secured at: 23 cm Tube secured with: Tape Dental Injury: Teeth and Oropharynx as per pre-operative assessment

## 2015-03-18 NOTE — Anesthesia Preprocedure Evaluation (Addendum)
Anesthesia Evaluation  Patient identified by MRN, date of birth, ID band Patient awake    Reviewed: Allergy & Precautions, NPO status , Patient's Chart, lab work & pertinent test results  History of Anesthesia Complications Negative for: history of anesthetic complications  Airway Mallampati: I  TM Distance: >3 FB Neck ROM: Full    Dental  (+) Teeth Intact   Pulmonary neg pulmonary ROS,    breath sounds clear to auscultation       Cardiovascular negative cardio ROS   Rhythm:Regular Rate:Normal     Neuro/Psych Hx of SAH c CVA CVA    GI/Hepatic negative GI ROS, Neg liver ROS,   Endo/Other  negative endocrine ROS  Renal/GU negative Renal ROS     Musculoskeletal  (+) Arthritis ,   Abdominal   Peds  Hematology   Anesthesia Other Findings   Reproductive/Obstetrics                            Anesthesia Physical Anesthesia Plan  ASA: II  Anesthesia Plan: General   Post-op Pain Management: GA combined w/ Regional for post-op pain   Induction: Intravenous  Airway Management Planned: Oral ETT  Additional Equipment:   Intra-op Plan:   Post-operative Plan: Extubation in OR  Informed Consent: I have reviewed the patients History and Physical, chart, labs and discussed the procedure including the risks, benefits and alternatives for the proposed anesthesia with the patient or authorized representative who has indicated his/her understanding and acceptance.   Dental advisory given  Plan Discussed with: CRNA and Surgeon  Anesthesia Plan Comments: (Plan bilateral PEC blocks c reduced Marcain % ,discussed c patient and husband)       Anesthesia Quick Evaluation

## 2015-03-18 NOTE — Brief Op Note (Signed)
03/18/2015  Q000111Q PM  PATIENT:  [name]  65 y.o. female  PRE-OPERATIVE DIAGNOSIS:  LEFT BREAST CANCER  POST-OPERATIVE DIAGNOSIS:  LEFT BREAST CANCER  PROCEDURE:  Procedure(s): LEFT TOTAL MASTECTOMY WITH LEFT AXILLARY SENTINEL LYMPH NODE BIOPSY (Left) RIGHT PROPHYLACTIC MASTECTOMY  (Right) BILATERAL BREAST RECONSTRUCTION WITH PLACEMENT OF TISSUE EXPANDER AND FLEX HD (ACELLULAR HYDRATED DERMIS) (Bilateral)  SURGEON:  Surgeon(s) and Role: Panel 1:    * Loel Lofty Dillingham, DO - Assisting    * Excell Seltzer, MD - Primary  Panel 2:    * Loel Lofty Dillingham, DO - Primary  PHYSICIAN ASSISTANT: Shawn Rayburn, PA  ASSISTANTS: none   ANESTHESIA:   general  EBL:  Total I/O In: 2000 [I.V.:2000] Out: 610 [Urine:260; Blood:350]  BLOOD ADMINISTERED:none  DRAINS: (2) Jackson-Pratt drain(s) with closed bulb suction in the breast pocket   LOCAL MEDICATIONS USED:  NONE  SPECIMEN:  No Specimen  DISPOSITION OF SPECIMEN:  N/A  COUNTS:  YES  TOURNIQUET:  * No tourniquets in log *  DICTATION: .Dragon Dictation  PLAN OF CARE: Admit to inpatient   PATIENT DISPOSITION:  PACU - hemodynamically stable.   Delay start of Pharmacological VTE agent (>24hrs) due to surgical blood loss or risk of bleeding: no

## 2015-03-19 ENCOUNTER — Encounter (HOSPITAL_COMMUNITY): Payer: Self-pay | Admitting: General Surgery

## 2015-03-19 NOTE — Progress Notes (Signed)
AVS to patient who verbalizes understanding. Has pain med already she says. Able to demo JP care and has supplies. Dc ambulatory to private car home with all personal belongings accompanied by husband at 88.

## 2015-03-19 NOTE — Discharge Summary (Signed)
Physician Discharge Summary  Patient ID: Vicki Hale MRN: [id number] DOB/AGE: 1950/10/25 65 y.o.  Admit date: 03/18/2015 Discharge date: 03/19/2015  Admission Diagnoses:  Discharge Diagnoses:  Active Problems:   Breast cancer [facility])   Discharged Condition: good  Hospital Course: The patient was taken to the OR and underwent a bilateral mastectomy with immediate reconstruction.  She did very well.  There were no complications. She was observed on the surgery unit.  She was able to eat, pain was controlled and she was ambulating in the hall.  Consults: None  Significant Diagnostic Studies: none  Treatments: surgery: mastectomies  Discharge Exam: Blood pressure 118/59, pulse 60, temperature 98.2 F (36.8 C), temperature source Oral, resp. rate 16, height 5\' 7"  (1.702 m), weight 80.003 kg (176 lb 6 oz), SpO2 98 %. General appearance: alert, cooperative and no distress Incision/Wound: incisions intact and no sign of hematoma.  Disposition: 01-Home or Self Care     Medication List    TAKE these medications        B COMPLEX PO  Take 2 tablets by mouth daily.     multivitamin with minerals Tabs tablet  Take 2 tablets by mouth daily.     tamoxifen 20 MG tablet  Commonly known as:  NOLVADEX  Take 1 tablet (20 mg total) by mouth daily.     vitamin C 1000 MG tablet  Take 1,000 mg by mouth 2 (two) times daily.           Follow-up Information    Follow up with [name], DO Today.   Specialty:  Plastic Surgery   Contact information:   [place] [address] [phone]       Follow up with Edward Jolly, MD In 2 weeks.   Specialty:  General Surgery   Contact information:   [place] [address] [phone]       Signed: [name] 03/19/2015, 2:59 PM

## 2015-03-19 NOTE — Care Management Note (Signed)
Case Management Note  Patient Details  Name: JAKAILYN DILS MRN: [id number] Date of Birth: 08-Mar-1950  Subjective/Objective:                    Action/Plan:   Expected Discharge Date:                  Expected Discharge Plan:  Home/Self Care  In-House Referral:     Discharge planning Services     Post Acute Care Choice:    Choice offered to:     DME Arranged:    DME Agency:     HH Arranged:    [place] Agency:     Status of Service:  In process, will continue to follow  Medicare Important Message Given:    Date Medicare IM Given:    Medicare IM give by:    Date Additional Medicare IM Given:    Additional Medicare Important Message give by:     If discussed at [place] of Stay Meetings, dates discussed:    Additional Comments:  Marilu Favre, RN 03/19/2015, 7:59 AM

## 2015-03-19 NOTE — Discharge Instructions (Signed)
May shower as able No heavy lifting Continue binder or sports bra

## 2015-03-20 ENCOUNTER — Ambulatory Visit: Payer: BC Managed Care – PPO | Admitting: Oncology

## 2015-03-20 ENCOUNTER — Encounter (HOSPITAL_COMMUNITY): Payer: Self-pay | Admitting: General Surgery

## 2015-03-23 ENCOUNTER — Encounter: Payer: Self-pay | Admitting: *Deleted

## 2015-03-23 NOTE — Progress Notes (Signed)
Ordered oncotype per Dr. [name]. Faxed PA to [employer] and faxed requisition to pathology and confirmed receipt.  

## 2015-03-31 ENCOUNTER — Encounter (HOSPITAL_COMMUNITY): Payer: Self-pay

## 2015-04-06 ENCOUNTER — Telehealth: Payer: Self-pay | Admitting: Genetic Counselor

## 2015-04-06 NOTE — Telephone Encounter (Signed)
Received forwarded staff message from HIM to schedule patient for genetics f/u per Santiago Glad. No lab needed. Left message for patient re genetics f/u 4/24 @ 9 am. Patient to get updated scheduled at 04/09/15 f/u with GM.

## 2015-04-07 ENCOUNTER — Encounter: Payer: Self-pay | Admitting: *Deleted

## 2015-04-07 NOTE — Progress Notes (Signed)
Received oncotype results of 29.  Copy to medical records and copy to Dr. Jana Hakim.

## 2015-04-09 ENCOUNTER — Encounter: Payer: Self-pay | Admitting: *Deleted

## 2015-04-09 ENCOUNTER — Other Ambulatory Visit: Payer: BC Managed Care – PPO

## 2015-04-09 ENCOUNTER — Telehealth: Payer: Self-pay | Admitting: Oncology

## 2015-04-09 ENCOUNTER — Ambulatory Visit (HOSPITAL_BASED_OUTPATIENT_CLINIC_OR_DEPARTMENT_OTHER): Payer: BC Managed Care – PPO | Admitting: Oncology

## 2015-04-09 ENCOUNTER — Other Ambulatory Visit: Payer: Self-pay | Admitting: *Deleted

## 2015-04-09 VITALS — BP 113/53 | HR 68 | Temp 97.5°F | Resp 18 | Ht 67.0 in | Wt 172.5 lb

## 2015-04-09 DIAGNOSIS — C50412 Malignant neoplasm of upper-outer quadrant of left female breast: Secondary | ICD-10-CM | POA: Diagnosis not present

## 2015-04-09 DIAGNOSIS — Z8481 Family history of carrier of genetic disease: Secondary | ICD-10-CM

## 2015-04-09 DIAGNOSIS — Z1509 Genetic susceptibility to other malignant neoplasm: Secondary | ICD-10-CM

## 2015-04-09 DIAGNOSIS — Z1501 Genetic susceptibility to malignant neoplasm of breast: Secondary | ICD-10-CM

## 2015-04-09 DIAGNOSIS — Z8543 Personal history of malignant neoplasm of ovary: Secondary | ICD-10-CM | POA: Diagnosis not present

## 2015-04-09 DIAGNOSIS — C569 Malignant neoplasm of unspecified ovary: Secondary | ICD-10-CM

## 2015-04-09 DIAGNOSIS — Z1379 Encounter for other screening for genetic and chromosomal anomalies: Secondary | ICD-10-CM

## 2015-04-09 NOTE — Progress Notes (Signed)
[place]  [phone] [phone]     ID: ZARIE KOSIBA DOB: [birth year]  MR#: [id number]  HWE#:[id number]  Patient Care Team: Leonides Sake, MD as PCP - General (Family Medicine) Chauncey Cruel, MD as Consulting Physician (Oncology) Rolm Bookbinder, MD as Consulting Physician (General Surgery) Consuella Lose, MD as Consulting Physician (Neurosurgery) Christene Slates, MD as Physician Assistant (Radiology) Rosalin Hawking, MD as Consulting Physician (Neurology) PCP: Leonides Sake, MD GYN: OTHER MD:  CHIEF COMPLAINT: Breast cancer in likely BRCA carrier  CURRENT TREATMENT: Adjuvant chemotherapy   CANCER HISTORY: From the original intake note:  Saadiya was diagnosed with ovarian cancer in April 2003. She underwent total abdominal hysterectomy and bilateral salpingo-oophorectomy with a full staging operation at [facility] that month and was then treated according to GOG 182, with 4 cycles of carboplatin/topotecan and then 4 cycles of carboplatin/paclitaxel. She was then followed by gynecologic oncology through 2011, and there has been no history of recurrent disease.  On 89/38/1017 Pricella underwent screening mammography at [facility] with tomosynthesis. Breast density was category A. New grouped calcifications were noted in the left breast upper outer quadrant and the patient was recalled for diagnostic unilateral left mammography 01/26/2015. This confirmed a group of new linear calcifications in the upper outer quadrant of the left breast extending over an area of 2.0 cm..   On 01/28/2015 the patient underwent biopsy of her left breast, with the pathology ([id number]) showing an invasive ductal carcinoma, grade 2, estrogen receptor 100% positive, progesterone receptor 50% positive, both with strong staining intensity, with an MIB-1 of 10% and no HER-2 amplification, the signals ratio being 1.21 and the number per cell 2.00.  Her subsequent history is as  detailed below  INTERVAL HISTORY: Nikia returns today for follow-up of her breast cancer accompanied by her husband Shanon Brow. Since her last visit here she underwent bilateral mastectomies with left-sided sentinel lymph node sampling. The final pathology ([id number]) showed the right side to be benign. On the left side there was a 1.5 cm invasive ductal carcinoma, grade 2, with negative margins and the single sentinel lymph node was clear. Repeat HER-2 was again negative, with a signals ratio 1.54 and the number per cell 2.5.  Oncotype DX score on this tumor was 29 predicting a 19% risk of outside the breast recurrence if the patient's only systemic therapy is tamoxifen for 5 years.  Her case was presented at the multidisciplinary breast cancer conference 04/08/2015. At that time it was felt that the patient would benefit from chemotherapy. She would not need adjuvant radiation. Recall that she is already status post bilateral salpingo-oophorectomy due to her history of BRCA associated ovarian cancer  REVIEW OF SYSTEMS: She did well with the surgery, with some soreness and discomfort, but after 3 days she was only taking Aleve. She has had no significant problems with bleeding or fever. Her drains were removed earlier this week and she has already had to "fillings" on her expanders. She anticipates having saline implants eventually placed. She is doing some walking as her form of exercise right now. A detailed review of systems today was otherwise stable  PAST MEDICAL HISTORY: Past Medical History  Diagnosis Date  . Ovarian cancer ([place])   . Stroke ([place])   . Breast cancer ([place]) 2017    [facility] in [place]  . Family history of breast cancer   . FH: BRCA1 gene positive   . Family history of pancreatic cancer   . SAH (subarachnoid  hemorrhage) ([place])     04/2014  . Pericarditis, acute     30 YRS AGO   . Arthritis     PAST SURGICAL HISTORY: Past Surgical History  Procedure Laterality Date  .  Tonsillectomy    . Hernia repair    . Total abdominal hysterectomy w/ bilateral salpingoophorectomy    . Cesarean section      X2  . Mastectomy w/ sentinel node biopsy Left 03/18/2015    Procedure: LEFT TOTAL MASTECTOMY WITH LEFT AXILLARY SENTINEL LYMPH NODE BIOPSY;  Surgeon: Excell Seltzer, MD;  Location: [place];  Service: General;  Laterality: Left;  Marland Kitchen Mastectomy, partial Right 03/18/2015    Procedure: RIGHT PROPHYLACTIC MASTECTOMY ;  Surgeon: Excell Seltzer, MD;  Location: [place];  Service: General;  Laterality: Right;  . Breast reconstruction with placement of tissue expander and flex hd (acellular hydrated dermis) Bilateral 03/18/2015    Procedure: BILATERAL BREAST RECONSTRUCTION WITH PLACEMENT OF TISSUE EXPANDER AND FLEX HD (ACELLULAR HYDRATED DERMIS);  Surgeon: Loel Lofty [place], DO;  Location: [place];  Service: Plastics;  Laterality: Bilateral;    FAMILY HISTORY Family History  Problem Relation Age of Onset  . Colon polyps Mother   . Colon cancer Neg Hx   . Rectal cancer Neg Hx   . Ulcerative colitis Neg Hx   . Stomach cancer Neg Hx   . Breast cancer Daughter 17  . BRCA 1/2 Daughter     BRCA1  . COPD Maternal Aunt   . Esophageal cancer Maternal Uncle   . Lung cancer Paternal Aunt   . Pancreatic cancer Maternal Grandmother     early 52s  . Prostate cancer Maternal Grandfather     49s  . Skin cancer Paternal Grandmother     untreated BCC  . COPD Maternal Aunt   . Lung cancer Maternal Aunt   . Breast cancer Cousin 80    maternal first cousin  . BRCA 1/2 Daughter     BRCA1  . Esophageal cancer Cousin     maternal first cousin  . Bladder Cancer Paternal Aunt    the patient's 2 daughters have been tested for the BRCA gene and both are positive. One of them had breast cancer at the age of 64. In addition on the maternal side there is pancreatic prostate breast and esophageal cancer. The patient's father died at the age of 74 and a car accident and there is very little  information on his side of the family. The patient's mother died at 57 from COPD. She had had a hysterectomy in her 58s. The patient's mother had 5 siblings several from died young. The patient herself had no sisters, 2 brothers.  GYNECOLOGIC HISTORY:  No LMP recorded. Patient has had a hysterectomy.  Menarche age 64, first live birth age 71, the patient is Jun[place] P2. She underwent TAH/BSO in 2003 for her ovarian cancer. She took hormone replacement for approximately 6 months. She was on oral contraceptives for approximately 4 years remotely, with no complications.  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 Her husband Shanon Brow works for a [employer]. Daughter Kalandra Masters lives in [place] where she works as a school principal. She is 63 years old. Daughter Ryleeann Urquiza lives in [place] and is a Pharmacist, hospital. She is 64 years old. These ages are as of January 2017. The patient has 4 grandchildren. She attends a [employer]  ADVANCED DIRECTIVES: Not in place   HEALTH MAINTENANCE: Social History  Substance Use Topics  . Smoking status: Never Smoker   . Smokeless tobacco: Never Used  . Alcohol Use: 0.0 oz/week    0 Standard drinks or equivalent per week     Comment: ocassional; once monthly     Colonoscopy: September 2016/ [facility]  PAP:  Bone density: 2016  Lipid panel:  Allergies  Allergen Reactions  . Penicillins Itching and Rash    Has patient had a PCN reaction causing immediate rash, facial/tongue/throat swelling, SOB or lightheadedness with hypotension: No Has patient had a PCN reaction causing severe rash involving mucus membranes or skin necrosis: No Has patient had a PCN reaction that required hospitalization No Has patient had a PCN reaction occurring within the last 10 years: No If all of the above answers are "NO", then may proceed with Cephalosporin use.     Current Outpatient Prescriptions   Medication Sig Dispense Refill  . Ascorbic Acid (VITAMIN C) 1000 MG tablet Take 1,000 mg by mouth 2 (two) times daily.    . B Complex Vitamins (B COMPLEX PO) Take 2 tablets by mouth daily.    . Multiple Vitamin (MULTIVITAMIN WITH MINERALS) TABS tablet Take 2 tablets by mouth daily.     No current facility-administered medications for this visit.    OBJECTIVE: Middle-aged white woman who appears stated age 4 Vitals:   04/09/15 0859  BP: 113/53  Pulse: 68  Temp: 97.5 F (36.4 C)  Resp: 18     Body mass index is 27.01 kg/(m^2).    ECOG FS:1 - Symptomatic but completely ambulatory  Sclerae unicteric, pupils round and equal Oropharynx clear and moist-- no thrush or other lesions No cervical or supraclavicular adenopathy Lungs no rales or rhonchi Heart regular rate and rhythm Abd soft, nontender, positive bowel sounds MSK no focal spinal tenderness, no upper extremity lymphedema Neuro: nonfocal, well oriented, appropriate affect Breasts: Status post bilateral mastectomies with expander placement. The incisions are healing nicely, with no dehiscence, erythema, or swelling. The cosmetic result appears good. Both axillae are benign.    LAB RESULTS:  CMP     Component Value Date/Time   NA 138 03/18/2015 1653   NA 141 02/12/2015 1543   NA 140 08/15/2014 1104   K 4.3 03/18/2015 1653   K 3.6 02/12/2015 1543   CL 104 03/18/2015 1653   CO2 25 03/18/2015 1653   CO2 26 02/12/2015 1543   GLUCOSE 170* 03/18/2015 1653   GLUCOSE 115 02/12/2015 1543   GLUCOSE 87 08/15/2014 1104   BUN 9 03/18/2015 1653   BUN 19.8 02/12/2015 1543   BUN 18 08/15/2014 1104   CREATININE 0.83 03/18/2015 1653   CREATININE 1.0 02/12/2015 1543   CALCIUM 8.9 03/18/2015 1653   CALCIUM 10.1 02/12/2015 1543   PROT 7.7 02/12/2015 1543   ALBUMIN 4.2 02/12/2015 1543   AST 19 02/12/2015 1543   ALT 16 02/12/2015 1543   ALKPHOS 96 02/12/2015 1543   BILITOT 0.35 02/12/2015 1543   GFRNONAA >60 03/18/2015 1653    GFRAA >60 03/18/2015 1653    INo results found for: SPEP, UPEP  Lab Results  Component Value Date   WBC 11.4* 03/18/2015   NEUTROABS 5.5 02/12/2015   HGB 12.5 03/18/2015   HCT 39.0 03/18/2015   MCV 89.0 03/18/2015   PLT 308 03/18/2015      Chemistry      Component Value Date/Time   NA 138 03/18/2015 1653   NA  141 02/12/2015 1543   NA 140 08/15/2014 1104   K 4.3 03/18/2015 1653   K 3.6 02/12/2015 1543   CL 104 03/18/2015 1653   CO2 25 03/18/2015 1653   CO2 26 02/12/2015 1543   BUN 9 03/18/2015 1653   BUN 19.8 02/12/2015 1543   BUN 18 08/15/2014 1104   CREATININE 0.83 03/18/2015 1653   CREATININE 1.0 02/12/2015 1543      Component Value Date/Time   CALCIUM 8.9 03/18/2015 1653   CALCIUM 10.1 02/12/2015 1543   ALKPHOS 96 02/12/2015 1543   AST 19 02/12/2015 1543   ALT 16 02/12/2015 1543   BILITOT 0.35 02/12/2015 1543       No results found for: LABCA2  No components found for: LABCA125  No results for input(s): INR in the last 168 hours.  Urinalysis No results found for: COLORURINE, APPEARANCEUR, LABSPEC, PHURINE, GLUCOSEU, HGBUR, BILIRUBINUR, KETONESUR, PROTEINUR, UROBILINOGEN, NITRITE, LEUKOCYTESUR  STUDIES: Nm Sentinel Node Inj-no Rpt (breast)  03/18/2015  CLINICAL DATA:  Sulfur colloid was injected intradermally by the nuclear medicine technologist for breast cancer sentinel node localization.     ASSESSMENT: 65 y.o. BRCA1 positive [place], [place] woman  (1) ovarian cancer stage IIIB diagnosed April 2003  (a) s/p supracervical hysterectomy with surgical staging  (b) treated according to GOG 182: carboplatin/ topotecan x4 followed by carboplatin/taxol x4  (c) last chemotherapy dose October 2003  (2) status post left breast needle core biopsy 01/28/2015 for an invasive ductal carcinoma, grade 2, estrogen and progesterone receptor positive, HER-2 not amplified, with an MIB-1 of 10%  (a) tamoxifen started neoadjuvantly due to concerns regarding surgical delay,  interrupted with chemotherapy  (3) status post bilateral mastectomies with left axillary lymph node sampling 03/18/2015 showing  (a) on the right, benign disease  (b) on the left, a pT1c pN0, stage IA invasive ductal carcinoma, repeat HER-2 again negative, stage IA    (4) Oncotype DX score of 29, in the high intermediate range, predicts a risk of recurrence with tamoxifen alone of 19%. The addition of chemotherapy in this setting is predicted to decrease distant disease-free recurrence by approximately 6%.   (5) adjuvant chemotherapy will consist of cyclophosphamide and docetaxel to start04/05/2015, repeated every 21 days 4  (6) adjuvant anti-estrogens to follow local treatment  PLAN: We spent approximately 50 minutes today going over analysis situation. She has an early-stage breast cancer which has however somewhat dangerous according to the Oncotype score. Without chemotherapy, her risk of recurrence would be high, in the 20% range. She can decrease this by approximately 6% if she takes adjuvant chemotherapy.  We discussed the possible toxicities side effects and complications of the ages she would use, namely Cytoxan and Taxotere. She already received for Taxol treatments as part of her ovarian cancer therapy 13 years ago. She has no residual neuropathy from that. I think she will be able to tolerate the planned Taxotere treatments, but if neuropathy does develop we will be able to make a change.  She will stop tamoxifen now and resume it when she completes her radiation treatments.  They are planning a trip between June 19 in June 26. I don't think there will be any problems with that. She will be done with treatment well prior to departure.  I set her up for port placement and to discuss supportive treatment prior to the start of her chemotherapy. She knows to call for any problems that may develop before her next visit.  Chauncey Cruel, MD   04/09/2015  9:12 AM Medical Oncology [facility] [address] [place], [place] [address] Tel. [phone]    Fax. [phone]

## 2015-04-09 NOTE — Telephone Encounter (Signed)
appt made and avs printed °

## 2015-04-10 MED ORDER — PROCHLORPERAZINE MALEATE 10 MG PO TABS: 10.0000 mg | ORAL_TABLET | Freq: Four times a day (QID) | ORAL | Status: DC | PRN

## 2015-04-10 MED ORDER — DEXAMETHASONE 4 MG PO TABS: 8.0000 mg | ORAL_TABLET | Freq: Two times a day (BID) | ORAL | Status: DC

## 2015-04-10 MED ORDER — ONDANSETRON HCL 8 MG PO TABS: 8.0000 mg | ORAL_TABLET | Freq: Two times a day (BID) | ORAL | Status: DC | PRN

## 2015-04-10 MED ORDER — LIDOCAINE-PRILOCAINE 2.5-2.5 % EX CREA: TOPICAL_CREAM | CUTANEOUS | Status: DC

## 2015-04-10 MED ORDER — LORAZEPAM 0.5 MG PO TABS: ORAL_TABLET | ORAL | Status: DC

## 2015-04-13 ENCOUNTER — Other Ambulatory Visit: Payer: Self-pay | Admitting: *Deleted

## 2015-04-17 ENCOUNTER — Encounter: Payer: BC Managed Care – PPO | Admitting: Nurse Practitioner

## 2015-04-17 ENCOUNTER — Telehealth: Payer: Self-pay | Admitting: *Deleted

## 2015-04-17 NOTE — Telephone Encounter (Signed)
Pt called in question of anti-nausea medications. Pt unaware of appt with Nira Conn, NP today. Discussed anti-nausea medications. Scheduled and confirmed appt for 04/20/15 at 1015 with Nira Conn, NP.

## 2015-04-17 NOTE — Progress Notes (Signed)
No show  This encounter was created in error - please disregard.

## 2015-04-20 ENCOUNTER — Ambulatory Visit (HOSPITAL_BASED_OUTPATIENT_CLINIC_OR_DEPARTMENT_OTHER): Payer: BC Managed Care – PPO | Admitting: Nurse Practitioner

## 2015-04-20 ENCOUNTER — Encounter: Payer: Self-pay | Admitting: Nurse Practitioner

## 2015-04-20 ENCOUNTER — Telehealth: Payer: Self-pay | Admitting: Nurse Practitioner

## 2015-04-20 VITALS — BP 138/62 | HR 60 | Temp 97.8°F | Resp 16 | Ht 67.0 in | Wt 173.5 lb

## 2015-04-20 DIAGNOSIS — Z8543 Personal history of malignant neoplasm of ovary: Secondary | ICD-10-CM | POA: Diagnosis not present

## 2015-04-20 DIAGNOSIS — C50412 Malignant neoplasm of upper-outer quadrant of left female breast: Secondary | ICD-10-CM

## 2015-04-20 MED ORDER — VALACYCLOVIR HCL 500 MG PO TABS: 500.0000 mg | ORAL_TABLET | Freq: Every day | ORAL | Status: DC

## 2015-04-20 NOTE — Progress Notes (Signed)
[place]  [phone] [phone]   ID: TULANI KIDNEY DOB: [birth year]  MR#: [id number]  FAO#:[id number]  Patient Care Team: Leonides Sake, MD as PCP - General (Family Medicine) Chauncey Cruel, MD as Consulting Physician (Oncology) Rolm Bookbinder, MD as Consulting Physician (General Surgery) Consuella Lose, MD as Consulting Physician (Neurosurgery) Christene Slates, MD as Physician Assistant (Radiology) Rosalin Hawking, MD as Consulting Physician (Neurology) PCP: Leonides Sake, MD GYN: OTHER MD:  CHIEF COMPLAINT: Breast cancer in likely BRCA carrier  CURRENT TREATMENT: Adjuvant chemotherapy  CANCER HISTORY: From the original intake note:  Carilyn was diagnosed with ovarian cancer in April 2003. She underwent total abdominal hysterectomy and bilateral salpingo-oophorectomy with a full staging operation at [facility] that month and was then treated according to GOG 182, with 4 cycles of carboplatin/topotecan and then 4 cycles of carboplatin/paclitaxel. She was then followed by gynecologic oncology through 2011, and there has been no history of recurrent disease.  On 69/62/9528 Rowynn underwent screening mammography at [facility] with tomosynthesis. Breast density was category A. New grouped calcifications were noted in the left breast upper outer quadrant and the patient was recalled for diagnostic unilateral left mammography 01/26/2015. This confirmed a group of new linear calcifications in the upper outer quadrant of the left breast extending over an area of 2.0 cm..   On 01/28/2015 the patient underwent biopsy of her left breast, with the pathology ([id number]) showing an invasive ductal carcinoma, grade 2, estrogen receptor 100% positive, progesterone receptor 50% positive, both with strong staining intensity, with an MIB-1 of 10% and no HER-2 amplification, the signals ratio being 1.21 and the number per cell 2.00.  Her subsequent history is as detailed  below  INTERVAL HISTORY: Dhara returns today for follow-up of her breast cancer, alone. She will be starting on cyclophosphamide and docetaxel later this week, and is here to discuss her antiemetic schedule for this regimen.   REVIEW OF SYSTEMS: Patrycja continues to have discomfort to her mastectomy sites. Her drains have been removed. She is taking aleve PRN for pain. She follows up with Dr. Migdalia Dk later this week. She has no usual bleeding. She does bruise easily. A baseball hit her left ankle last week and it is somewhat swollen. She is elevating this and kept ice on it for the first 24 hours. A detailed review of systems is otherwise stable.  PAST MEDICAL HISTORY: Past Medical History  Diagnosis Date  . Ovarian cancer ([place])   . Stroke ([place])   . Breast cancer ([place]) 2017    [facility] in [place]  . Family history of breast cancer   . FH: BRCA1 gene positive   . Family history of pancreatic cancer   . SAH (subarachnoid hemorrhage) ([place])     04/2014  . Pericarditis, acute     30 YRS AGO   . Arthritis     PAST SURGICAL HISTORY: Past Surgical History  Procedure Laterality Date  . Tonsillectomy    . Hernia repair    . Total abdominal hysterectomy w/ bilateral salpingoophorectomy    . Cesarean section      X2  . Mastectomy w/ sentinel node biopsy Left 03/18/2015    Procedure: LEFT TOTAL MASTECTOMY WITH LEFT AXILLARY SENTINEL LYMPH NODE BIOPSY;  Surgeon: Excell Seltzer, MD;  Location: [place];  Service: General;  Laterality: Left;  Marland Kitchen Mastectomy, partial Right 03/18/2015    Procedure: RIGHT PROPHYLACTIC MASTECTOMY ;  Surgeon: Excell Seltzer, MD;  Location: [place];  Service: General;  Laterality: Right;  . Breast reconstruction with placement of tissue expander and flex hd (acellular hydrated dermis) Bilateral 03/18/2015    Procedure: BILATERAL BREAST RECONSTRUCTION WITH PLACEMENT OF TISSUE EXPANDER AND FLEX HD (ACELLULAR HYDRATED DERMIS);  Surgeon: Loel Lofty Dillingham, DO;  Location: [place];   Service: Plastics;  Laterality: Bilateral;    FAMILY HISTORY Family History  Problem Relation Age of Onset  . Colon polyps Mother   . Colon cancer Neg Hx   . Rectal cancer Neg Hx   . Ulcerative colitis Neg Hx   . Stomach cancer Neg Hx   . Breast cancer Daughter 1  . BRCA 1/2 Daughter     BRCA1  . COPD Maternal Aunt   . Esophageal cancer Maternal Uncle   . Lung cancer Paternal Aunt   . Pancreatic cancer Maternal Grandmother     early 10s  . Prostate cancer Maternal Grandfather     3s  . Skin cancer Paternal Grandmother     untreated BCC  . COPD Maternal Aunt   . Lung cancer Maternal Aunt   . Breast cancer Cousin 30    maternal first cousin  . BRCA 1/2 Daughter     BRCA1  . Esophageal cancer Cousin     maternal first cousin  . Bladder Cancer Paternal Aunt    the patient's 2 daughters have been tested for the BRCA gene and both are positive. One of them had breast cancer at the age of 63. In addition on the maternal side there is pancreatic prostate breast and esophageal cancer. The patient's father died at the age of 62 and a car accident and there is very little information on his side of the family. The patient's mother died at 36 from COPD. She had had a hysterectomy in her 40s. The patient's mother had 5 siblings several from died young. The patient herself had no sisters, 2 brothers.  GYNECOLOGIC HISTORY:  No LMP recorded. Patient has had a hysterectomy.  Menarche age 53, first live birth age 20, the patient is [place] P2. She underwent TAH/BSO in 2003 for her ovarian cancer. She took hormone replacement for approximately 6 months. She was on oral contraceptives for approximately 4 years remotely, with no complications.  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 Her husband Shanon Brow works for a [employer]. Daughter Florida Nolton lives in [place] where she works as a school principal. She is 64 years  old. Daughter Kenidee Cregan lives in [place] and is a Pharmacist, hospital. She is 64 years old. These ages are as of January 2017. The patient has 4 grandchildren. She attends a [employer]    ADVANCED DIRECTIVES: Not in place   HEALTH MAINTENANCE: Social History  Substance Use Topics  . Smoking status: Never Smoker   . Smokeless tobacco: Never Used  . Alcohol Use: 0.0 oz/week    0 Standard drinks or equivalent per week     Comment: ocassional; once monthly     Colonoscopy: September 2016/ [facility]  PAP:  Bone density: 2016  Lipid panel:  Allergies  Allergen Reactions  . Penicillins Itching and Rash    Has patient had a PCN reaction causing immediate rash, facial/tongue/throat swelling, SOB or lightheadedness with hypotension: No Has patient had a PCN reaction causing severe rash involving mucus membranes or skin necrosis: No Has patient had a PCN reaction that required hospitalization No Has patient had a PCN  reaction occurring within the last 10 years: No If all of the above answers are "NO", then may proceed with Cephalosporin use.     Current Outpatient Prescriptions  Medication Sig Dispense Refill  . Ascorbic Acid (VITAMIN C) 1000 MG tablet Take 1,000 mg by mouth 2 (two) times daily.    . B Complex Vitamins (B COMPLEX PO) Take 2 tablets by mouth daily.    Marland Kitchen ibuprofen (ADVIL,MOTRIN) 200 MG tablet Take 200 mg by mouth every 8 (eight) hours as needed. Pt takes 400 mg in am and 400 mg in  pm    . Multiple Vitamin (MULTIVITAMIN WITH MINERALS) TABS tablet Take 2 tablets by mouth daily.    Marland Kitchen dexamethasone (DECADRON) 4 MG tablet Take 2 tablets (8 mg total) by mouth 2 (two) times daily. Start the day before Taxotere. Then again the day after chemo for 3 days. (Patient not taking: Reported on 04/20/2015) 30 tablet 1  . HYDROcodone-acetaminophen (NORCO/VICODIN) 5-325 MG tablet Take 1 tablet by mouth every 6 (six) hours as needed for moderate pain. Reported on 04/20/2015    . LORazepam  (ATIVAN) 0.5 MG tablet Take daily at bedtime as needed for insomnia (Patient not taking: Reported on 04/20/2015) 30 tablet 0  . ondansetron (ZOFRAN) 8 MG tablet Take 1 tablet (8 mg total) by mouth 2 (two) times daily as needed for refractory nausea / vomiting. Start on day 3 after chemo. (Patient not taking: Reported on 04/20/2015) 30 tablet 1  . prochlorperazine (COMPAZINE) 10 MG tablet Take 1 tablet (10 mg total) by mouth every 6 (six) hours as needed (Nausea or vomiting). (Patient not taking: Reported on 04/20/2015) 30 tablet 1  . tamoxifen (NOLVADEX) 20 MG tablet Take 20 mg by mouth daily. Stopped on 04/17/15.     No current facility-administered medications for this visit.    OBJECTIVE: Middle-aged white woman who appears stated age 81 Vitals:   04/20/15 1038  BP: 138/62  Pulse: 60  Temp: 97.8 F (36.6 C)  Resp: 16     Body mass index is 27.17 kg/(m^2).    ECOG FS:1 - Symptomatic but completely ambulatory  Skin: warm, dry  HEENT: sclerae anicteric, conjunctivae pink, oropharynx clear. No thrush or mucositis.  Lymph Nodes: No cervical or supraclavicular lymphadenopathy  Lungs: clear to auscultation bilaterally, no rales, wheezes, or rhonci  Heart: regular rate and rhythm  Abdomen: round, soft, non tender, positive bowel sounds  Musculoskeletal: No focal spinal tenderness, no peripheral edema  Neuro: non focal, well oriented, positive affect  Breasts: deferred  LAB RESULTS:  CMP     Component Value Date/Time   NA 138 03/18/2015 1653   NA 141 02/12/2015 1543   NA 140 08/15/2014 1104   K 4.3 03/18/2015 1653   K 3.6 02/12/2015 1543   CL 104 03/18/2015 1653   CO2 25 03/18/2015 1653   CO2 26 02/12/2015 1543   GLUCOSE 170* 03/18/2015 1653   GLUCOSE 115 02/12/2015 1543   GLUCOSE 87 08/15/2014 1104   BUN 9 03/18/2015 1653   BUN 19.8 02/12/2015 1543   BUN 18 08/15/2014 1104   CREATININE 0.83 03/18/2015 1653   CREATININE 1.0 02/12/2015 1543   CALCIUM 8.9 03/18/2015 1653    CALCIUM 10.1 02/12/2015 1543   PROT 7.7 02/12/2015 1543   ALBUMIN 4.2 02/12/2015 1543   AST 19 02/12/2015 1543   ALT 16 02/12/2015 1543   ALKPHOS 96 02/12/2015 1543   BILITOT 0.35 02/12/2015 1543   GFRNONAA >60 03/18/2015 1653  GFRAA >60 03/18/2015 1653    INo results found for: SPEP, UPEP  Lab Results  Component Value Date   WBC 11.4* 03/18/2015   NEUTROABS 5.5 02/12/2015   HGB 12.5 03/18/2015   HCT 39.0 03/18/2015   MCV 89.0 03/18/2015   PLT 308 03/18/2015      Chemistry      Component Value Date/Time   NA 138 03/18/2015 1653   NA 141 02/12/2015 1543   NA 140 08/15/2014 1104   K 4.3 03/18/2015 1653   K 3.6 02/12/2015 1543   CL 104 03/18/2015 1653   CO2 25 03/18/2015 1653   CO2 26 02/12/2015 1543   BUN 9 03/18/2015 1653   BUN 19.8 02/12/2015 1543   BUN 18 08/15/2014 1104   CREATININE 0.83 03/18/2015 1653   CREATININE 1.0 02/12/2015 1543      Component Value Date/Time   CALCIUM 8.9 03/18/2015 1653   CALCIUM 10.1 02/12/2015 1543   ALKPHOS 96 02/12/2015 1543   AST 19 02/12/2015 1543   ALT 16 02/12/2015 1543   BILITOT 0.35 02/12/2015 1543       No results found for: LABCA2  No components found for: LABCA125  No results for input(s): INR in the last 168 hours.  Urinalysis No results found for: COLORURINE, APPEARANCEUR, LABSPEC, PHURINE, GLUCOSEU, HGBUR, BILIRUBINUR, KETONESUR, PROTEINUR, UROBILINOGEN, NITRITE, LEUKOCYTESUR  STUDIES: No results found.   ASSESSMENT: 65 y.o. BRCA1 positive [place] woman  (1) ovarian cancer stage IIIB diagnosed April 2003  (a) s/p supracervical hysterectomy with surgical staging  (b) treated according to GOG 182: carboplatin/ topotecan x4 followed by carboplatin/taxol x4  (c) last chemotherapy dose October 2003  (2) status post left breast needle core biopsy 01/28/2015 for an invasive ductal carcinoma, grade 2, estrogen and progesterone receptor positive, HER-2 not amplified, with an MIB-1 of 10%  (a) tamoxifen  started neoadjuvantly due to concerns regarding surgical delay, interrupted with chemotherapy  (3) status post bilateral mastectomies with left axillary lymph node sampling 03/18/2015 showing  (a) on the right, benign disease  (b) on the left, a pT1c pN0, stage IA invasive ductal carcinoma, repeat HER-2 again negative, stage IA    (4) Oncotype DX score of 29, in the high intermediate range, predicts a risk of recurrence with tamoxifen alone of 19%. The addition of chemotherapy in this setting is predicted to decrease distant disease-free recurrence by approximately 6%.   (5) adjuvant chemotherapy will consist of cyclophosphamide and docetaxel to start 04/22/2015, repeated every 21 days 4  (6) adjuvant anti-estrogens to follow local treatment  PLAN: [place] and I spent about 40 minutes discussing her upcoming chemotherapy plans. She was made aware of potential side effects and toxicities of both cyclophosphamide and docetaxel. She attended chemotherapy school last week so a lot of this information was a review for her. She was made aware of her neulasta injections, including the fact that these will be administered with an onbody injector that she will need to wear until the device is no longer active.   Finally, we reviewed her antiemetic schedule, and she feels comfortable with every medication listed as well as the indication and potential side effects. She has the prescriptions on hand today. I am sending in a script for valacyclovir that she will start on Wednesday, because she was prone to mouth sores with her previous treatment  Meka will return on Wednesday for her first cycle of treatment. She understands and agrees with this plan. She knows the goal of treatment in  her case is cure. She has been encouraged to call with any issues that might arise before her next visit here.Marland Kitchen  Laurie Panda, NP   04/20/2015 11:31 AM

## 2015-04-20 NOTE — Telephone Encounter (Signed)
Printed pt updated schedule. avs printed

## 2015-04-21 SURGERY — Surgical Case
Anesthesia: *Unknown

## 2015-04-22 ENCOUNTER — Ambulatory Visit (HOSPITAL_BASED_OUTPATIENT_CLINIC_OR_DEPARTMENT_OTHER): Payer: BC Managed Care – PPO

## 2015-04-22 ENCOUNTER — Other Ambulatory Visit (HOSPITAL_BASED_OUTPATIENT_CLINIC_OR_DEPARTMENT_OTHER): Payer: BC Managed Care – PPO

## 2015-04-22 VITALS — BP 136/68 | HR 58 | Temp 97.0°F | Resp 18

## 2015-04-22 DIAGNOSIS — C50412 Malignant neoplasm of upper-outer quadrant of left female breast: Secondary | ICD-10-CM

## 2015-04-22 DIAGNOSIS — Z5111 Encounter for antineoplastic chemotherapy: Secondary | ICD-10-CM | POA: Diagnosis not present

## 2015-04-22 LAB — CBC WITH DIFFERENTIAL/PLATELET
BASO%: 0.2 % (ref 0.0–2.0)
Basophils Absolute: 0 10*3/uL (ref 0.0–0.1)
EOS%: 0 % (ref 0.0–7.0)
Eosinophils Absolute: 0 10*3/uL (ref 0.0–0.5)
HCT: 41.4 % (ref 34.8–46.6)
HGB: 13.3 g/dL (ref 11.6–15.9)
LYMPH%: 8 % — ABNORMAL LOW (ref 14.0–49.7)
MCH: 28.7 pg (ref 25.1–34.0)
MCHC: 32.2 g/dL (ref 31.5–36.0)
MCV: 89.2 fL (ref 79.5–101.0)
MONO#: 0.3 10*3/uL (ref 0.1–0.9)
MONO%: 2.4 % (ref 0.0–14.0)
NEUT#: 12.3 10*3/uL — ABNORMAL HIGH (ref 1.5–6.5)
NEUT%: 89.4 % — ABNORMAL HIGH (ref 38.4–76.8)
Platelets: 288 10*3/uL (ref 145–400)
RBC: 4.64 10*6/uL (ref 3.70–5.45)
RDW: 13.3 % (ref 11.2–14.5)
WBC: 13.7 10*3/uL — ABNORMAL HIGH (ref 3.9–10.3)
lymph#: 1.1 10*3/uL (ref 0.9–3.3)

## 2015-04-22 LAB — COMPREHENSIVE METABOLIC PANEL
ALT: 14 U/L (ref 0–55)
AST: 18 U/L (ref 5–34)
Albumin: 4 g/dL (ref 3.5–5.0)
Alkaline Phosphatase: 69 U/L (ref 40–150)
Anion Gap: 11 mEq/L (ref 3–11)
BUN: 15.3 mg/dL (ref 7.0–26.0)
CO2: 23 mEq/L (ref 22–29)
Calcium: 9.7 mg/dL (ref 8.4–10.4)
Chloride: 108 mEq/L (ref 98–109)
Creatinine: 0.9 mg/dL (ref 0.6–1.1)
EGFR: 72 mL/min/{1.73_m2} — ABNORMAL LOW (ref >90)
Glucose: 149 mg/dl — ABNORMAL HIGH (ref 70–140)
Potassium: 3.6 mEq/L (ref 3.5–5.1)
Sodium: 142 mEq/L (ref 136–145)
Total Bilirubin: 0.35 mg/dL (ref 0.20–1.20)
Total Protein: 7.6 g/dL (ref 6.4–8.3)

## 2015-04-22 MED ORDER — PALONOSETRON HCL INJECTION 0.25 MG/5ML
0.2500 mg | Freq: Once | INTRAVENOUS | Status: AC
  Administered 2015-04-22: 0.25 mg via INTRAVENOUS

## 2015-04-22 MED ORDER — PALONOSETRON HCL INJECTION 0.25 MG/5ML
INTRAVENOUS | Status: AC
  Filled 2015-04-22: qty 5

## 2015-04-22 MED ORDER — SODIUM CHLORIDE 0.9 % IV SOLN
75.0000 mg/m2 | Freq: Once | INTRAVENOUS | Status: AC
  Administered 2015-04-22: 140 mg via INTRAVENOUS
  Filled 2015-04-22: qty 14

## 2015-04-22 MED ORDER — SODIUM CHLORIDE 0.9 % IV SOLN
600.0000 mg/m2 | Freq: Once | INTRAVENOUS | Status: AC
  Administered 2015-04-22: 1160 mg via INTRAVENOUS
  Filled 2015-04-22: qty 58

## 2015-04-22 MED ORDER — SODIUM CHLORIDE 0.9 % IV SOLN
Freq: Once | INTRAVENOUS | Status: AC
  Administered 2015-04-22: 11:00:00 via INTRAVENOUS

## 2015-04-22 MED ORDER — SODIUM CHLORIDE 0.9 % IV SOLN
10.0000 mg | Freq: Once | INTRAVENOUS | Status: AC
  Administered 2015-04-22: 10 mg via INTRAVENOUS
  Filled 2015-04-22: qty 1

## 2015-04-22 MED ORDER — PEGFILGRASTIM 6 MG/0.6ML ~~LOC~~ PSKT
6.0000 mg | PREFILLED_SYRINGE | Freq: Once | SUBCUTANEOUS | Status: AC
  Administered 2015-04-22: 6 mg via SUBCUTANEOUS
  Filled 2015-04-22: qty 0.6

## 2015-04-23 ENCOUNTER — Encounter: Payer: Self-pay | Admitting: Oncology

## 2015-04-23 NOTE — Progress Notes (Signed)
Spoke w/ pt regarding copay assistance.  Informed her about the [employer] First Step program & went over how it works.  I will enroll her on 04/29/15 when she comes for her appt & signs the application.  I attempted to enroll her w/ the [facility] but unfortunately she is not eligible because her income exceeds the guidelines of their program.  I will give her a copy of the denial.  She also exceeds the income guidelines for the [employer] as well.

## 2015-04-24 ENCOUNTER — Encounter: Payer: Self-pay | Admitting: *Deleted

## 2015-04-24 ENCOUNTER — Ambulatory Visit: Payer: BC Managed Care – PPO

## 2015-04-29 ENCOUNTER — Encounter: Payer: Self-pay | Admitting: Oncology

## 2015-04-29 ENCOUNTER — Other Ambulatory Visit (HOSPITAL_BASED_OUTPATIENT_CLINIC_OR_DEPARTMENT_OTHER): Payer: BC Managed Care – PPO

## 2015-04-29 ENCOUNTER — Ambulatory Visit (HOSPITAL_BASED_OUTPATIENT_CLINIC_OR_DEPARTMENT_OTHER): Payer: BC Managed Care – PPO | Admitting: Oncology

## 2015-04-29 VITALS — BP 107/68 | HR 76 | Temp 97.9°F | Resp 18 | Ht 67.0 in | Wt 168.8 lb

## 2015-04-29 DIAGNOSIS — C50412 Malignant neoplasm of upper-outer quadrant of left female breast: Secondary | ICD-10-CM

## 2015-04-29 DIAGNOSIS — Z8543 Personal history of malignant neoplasm of ovary: Secondary | ICD-10-CM

## 2015-04-29 DIAGNOSIS — Z1501 Genetic susceptibility to malignant neoplasm of breast: Secondary | ICD-10-CM

## 2015-04-29 DIAGNOSIS — Z1509 Genetic susceptibility to other malignant neoplasm: Secondary | ICD-10-CM

## 2015-04-29 DIAGNOSIS — M545 Low back pain: Secondary | ICD-10-CM | POA: Diagnosis not present

## 2015-04-29 DIAGNOSIS — Z8481 Family history of carrier of genetic disease: Secondary | ICD-10-CM

## 2015-04-29 DIAGNOSIS — C569 Malignant neoplasm of unspecified ovary: Secondary | ICD-10-CM

## 2015-04-29 LAB — COMPREHENSIVE METABOLIC PANEL
ALT: 15 U/L (ref 0–55)
AST: 15 U/L (ref 5–34)
Albumin: 3.3 g/dL — ABNORMAL LOW (ref 3.5–5.0)
Alkaline Phosphatase: 70 U/L (ref 40–150)
Anion Gap: 7 mEq/L (ref 3–11)
BUN: 15.6 mg/dL (ref 7.0–26.0)
CO2: 25 mEq/L (ref 22–29)
Calcium: 9.1 mg/dL (ref 8.4–10.4)
Chloride: 103 mEq/L (ref 98–109)
Creatinine: 0.9 mg/dL (ref 0.6–1.1)
EGFR: 73 mL/min/{1.73_m2} — ABNORMAL LOW (ref >90)
Glucose: 98 mg/dl (ref 70–140)
Potassium: 4.2 mEq/L (ref 3.5–5.1)
Sodium: 135 mEq/L — ABNORMAL LOW (ref 136–145)
Total Bilirubin: 0.53 mg/dL (ref 0.20–1.20)
Total Protein: 6.4 g/dL (ref 6.4–8.3)

## 2015-04-29 LAB — CBC WITH DIFFERENTIAL/PLATELET
BASO%: 1.5 % (ref 0.0–2.0)
Basophils Absolute: 0 10*3/uL (ref 0.0–0.1)
EOS%: 7.3 % — ABNORMAL HIGH (ref 0.0–7.0)
Eosinophils Absolute: 0.2 10*3/uL (ref 0.0–0.5)
HCT: 37.5 % (ref 34.8–46.6)
HGB: 12.4 g/dL (ref 11.6–15.9)
LYMPH%: 32.5 % (ref 14.0–49.7)
MCH: 29.1 pg (ref 25.1–34.0)
MCHC: 33.2 g/dL (ref 31.5–36.0)
MCV: 87.7 fL (ref 79.5–101.0)
MONO#: 0.9 10*3/uL (ref 0.1–0.9)
MONO%: 32.5 % — ABNORMAL HIGH (ref 0.0–14.0)
NEUT#: 0.8 10*3/uL — ABNORMAL LOW (ref 1.5–6.5)
NEUT%: 26.2 % — ABNORMAL LOW (ref 38.4–76.8)
Platelets: 153 10*3/uL (ref 145–400)
RBC: 4.27 10*6/uL (ref 3.70–5.45)
RDW: 12.8 % (ref 11.2–14.5)
WBC: 2.9 10*3/uL — ABNORMAL LOW (ref 3.9–10.3)
lymph#: 0.9 10*3/uL (ref 0.9–3.3)

## 2015-04-29 NOTE — Progress Notes (Signed)
Enrolled pt in the [employer] First Step program.  Faxed signed form & activated card today.

## 2015-04-29 NOTE — Progress Notes (Signed)
[place]  [phone] [phone]   ID: KATHY WAHID DOB: [birth year]  MR#: [id number]  FAO#:[id number]  Patient Care Team: Leonides Sake, MD as PCP - General (Family Medicine) Chauncey Cruel, MD as Consulting Physician (Oncology) Rolm Bookbinder, MD as Consulting Physician (General Surgery) Consuella Lose, MD as Consulting Physician (Neurosurgery) Christene Slates, MD as Physician Assistant (Radiology) Rosalin Hawking, MD as Consulting Physician (Neurology) PCP: Leonides Sake, MD GYN: OTHER MD:  CHIEF COMPLAINT: Breast cancer in likely BRCA carrier  CURRENT TREATMENT: Adjuvant chemotherapy  CANCER HISTORY: From the original intake note:  Allyanna was diagnosed with ovarian cancer in April 2003. She underwent total abdominal hysterectomy and bilateral salpingo-oophorectomy with a full staging operation at [facility] that month and was then treated according to GOG 182, with 4 cycles of carboplatin/topotecan and then 4 cycles of carboplatin/paclitaxel. She was then followed by gynecologic oncology through 2011, and there has been no history of recurrent disease.  On 69/62/9528 Velvie underwent screening mammography at [facility] with tomosynthesis. Breast density was category A. New grouped calcifications were noted in the left breast upper outer quadrant and the patient was recalled for diagnostic unilateral left mammography 01/26/2015. This confirmed a group of new linear calcifications in the upper outer quadrant of the left breast extending over an area of 2.0 cm..   On 01/28/2015 the patient underwent biopsy of her left breast, with the pathology ([id number]) showing an invasive ductal carcinoma, grade 2, estrogen receptor 100% positive, progesterone receptor 50% positive, both with strong staining intensity, with an MIB-1 of 10% and no HER-2 amplification, the signals ratio being 1.21 and the number per cell 2.00.  Her subsequent history is as detailed  below  INTERVAL HISTORY: Yamilette returns today for follow-up of her estrogen receptor positive breast. Today is day 8 cycle 1 of 4 planned cycles of cyclophosphamide and docetaxel given 21 days apart with onpro support.  REVIEW OF SYSTEMS: Shereece did generally quite well with her first cycle. Her veins held up well (she decided against port). The on Pro work like it was supposed to. On day 3 she started to have a headache. She did not take any Zofran so it's not going to be related to that and is rather late for the palenosatron to be responsible so I suspect this is going to be due to allergies. It did help that she took some Claritin. At the same time she developed aches "all over", which are going to be due to the growth factor. She took ibuprofen initially 2 tablets twice daily with food but after a day she took 4 tablets twice daily with food. She did this for a total of 3 days. It took care of the pain quite well. In addition within the day of chemotherapy she started having constipation. She  Took Dulcolax in the evening of day 3 and then started Metamucil and Senokot, which took care of the problem she had a little bit of 80 feeling but no real nausea and certainly no vomiting. Her taste is slightly metallic. She has been able to eat and drink without any difficulty. She still has a little bit of knee pain and a little bit of low back pain but she is unable to sleep flat at present because of her reconstruction. She thinks that may be the reason for her low back pain. A detailed review of systems today was otherwise stable  PAST MEDICAL HISTORY: Past Medical History  Diagnosis Date  .  Ovarian cancer ([place])   . Stroke ([place])   . Breast cancer ([place]) 2017    [facility] in [place]  . Family history of breast cancer   . FH: BRCA1 gene positive   . Family history of pancreatic cancer   . SAH (subarachnoid hemorrhage) ([place])     04/2014  . Pericarditis, acute     30 YRS AGO   . Arthritis     PAST SURGICAL  HISTORY: Past Surgical History  Procedure Laterality Date  . Tonsillectomy    . Hernia repair    . Total abdominal hysterectomy w/ bilateral salpingoophorectomy    . Cesarean section      X2  . Mastectomy w/ sentinel node biopsy Left 03/18/2015    Procedure: LEFT TOTAL MASTECTOMY WITH LEFT AXILLARY SENTINEL LYMPH NODE BIOPSY;  Surgeon: Excell Seltzer, MD;  Location: [place];  Service: General;  Laterality: Left;  Marland Kitchen Mastectomy, partial Right 03/18/2015    Procedure: RIGHT PROPHYLACTIC MASTECTOMY ;  Surgeon: Excell Seltzer, MD;  Location: [place];  Service: General;  Laterality: Right;  . Breast reconstruction with placement of tissue expander and flex hd (acellular hydrated dermis) Bilateral 03/18/2015    Procedure: BILATERAL BREAST RECONSTRUCTION WITH PLACEMENT OF TISSUE EXPANDER AND FLEX HD (ACELLULAR HYDRATED DERMIS);  Surgeon: Loel Lofty Dillingham, DO;  Location: [place];  Service: Plastics;  Laterality: Bilateral;    FAMILY HISTORY Family History  Problem Relation Age of Onset  . Colon polyps Mother   . Colon cancer Neg Hx   . Rectal cancer Neg Hx   . Ulcerative colitis Neg Hx   . Stomach cancer Neg Hx   . Breast cancer Daughter 30  . BRCA 1/2 Daughter     BRCA1  . COPD Maternal Aunt   . Esophageal cancer Maternal Uncle   . Lung cancer Paternal Aunt   . Pancreatic cancer Maternal Grandmother     early 38s  . Prostate cancer Maternal Grandfather     32s  . Skin cancer Paternal Grandmother     untreated BCC  . COPD Maternal Aunt   . Lung cancer Maternal Aunt   . Breast cancer Cousin 89    maternal first cousin  . BRCA 1/2 Daughter     BRCA1  . Esophageal cancer Cousin     maternal first cousin  . Bladder Cancer Paternal Aunt    the patient's 2 daughters have been tested for the BRCA gene and both are positive. One of them had breast cancer at the age of 23. In addition on the maternal side there is pancreatic prostate breast and esophageal cancer. The patient's father died at  the age of 36 and a car accident and there is very little information on his side of the family. The patient's mother died at 27 from COPD. She had had a hysterectomy in her 55s. The patient's mother had 5 siblings several from died young. The patient herself had no sisters, 2 brothers.  GYNECOLOGIC HISTORY:  No LMP recorded. Patient has had a hysterectomy.  Menarche age 8, first live birth age 19, the patient is [place] P2. She underwent TAH/BSO in 2003 for her ovarian cancer. She took hormone replacement for approximately 6 months. She was on oral contraceptives for approximately 4 years remotely, with no complications.  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 Her husband Shanon Brow works for a [employer]. Daughter Ajiah Mcglinn lives in  [place] where she works as a Programmer, multimedia. She is 46 years old. Daughter Baneen Wieseler lives in [place] and is a Pharmacist, hospital. She is 64 years old. These ages are as of January 2017. The patient has 4 grandchildren. She attends a [employer]    ADVANCED DIRECTIVES: Not in place   HEALTH MAINTENANCE: Social History  Substance Use Topics  . Smoking status: Never Smoker   . Smokeless tobacco: Never Used  . Alcohol Use: 0.0 oz/week    0 Standard drinks or equivalent per week     Comment: ocassional; once monthly     Colonoscopy: September 2016/ [facility]  PAP:  Bone density: 2016  Lipid panel:  Allergies  Allergen Reactions  . Penicillins Itching and Rash    Has patient had a PCN reaction causing immediate rash, facial/tongue/throat swelling, SOB or lightheadedness with hypotension: No Has patient had a PCN reaction causing severe rash involving mucus membranes or skin necrosis: No Has patient had a PCN reaction that required hospitalization No Has patient had a PCN reaction occurring within the last 10 years: No If all of the above answers are "NO", then may proceed with  Cephalosporin use.     Current Outpatient Prescriptions  Medication Sig Dispense Refill  . Ascorbic Acid (VITAMIN C) 1000 MG tablet Take 1,000 mg by mouth 2 (two) times daily.    . B Complex Vitamins (B COMPLEX PO) Take 2 tablets by mouth daily.    Marland Kitchen dexamethasone (DECADRON) 4 MG tablet Take 2 tablets (8 mg total) by mouth 2 (two) times daily. Start the day before Taxotere. Then again the day after chemo for 3 days. (Patient not taking: Reported on 04/20/2015) 30 tablet 1  . HYDROcodone-acetaminophen (NORCO/VICODIN) 5-325 MG tablet Take 1 tablet by mouth every 6 (six) hours as needed for moderate pain. Reported on 04/20/2015    . ibuprofen (ADVIL,MOTRIN) 200 MG tablet Take 200 mg by mouth every 8 (eight) hours as needed. Pt takes 400 mg in am and 400 mg in  pm    . LORazepam (ATIVAN) 0.5 MG tablet Take daily at bedtime as needed for insomnia (Patient not taking: Reported on 04/20/2015) 30 tablet 0  . Multiple Vitamin (MULTIVITAMIN WITH MINERALS) TABS tablet Take 2 tablets by mouth daily.    . ondansetron (ZOFRAN) 8 MG tablet Take 1 tablet (8 mg total) by mouth 2 (two) times daily as needed for refractory nausea / vomiting. Start on day 3 after chemo. (Patient not taking: Reported on 04/20/2015) 30 tablet 1  . prochlorperazine (COMPAZINE) 10 MG tablet Take 1 tablet (10 mg total) by mouth every 6 (six) hours as needed (Nausea or vomiting). (Patient not taking: Reported on 04/20/2015) 30 tablet 1  . tamoxifen (NOLVADEX) 20 MG tablet Take 20 mg by mouth daily. Stopped on 04/17/15.    Marland Kitchen valACYclovir (VALTREX) 500 MG tablet Take 1 tablet (500 mg total) by mouth daily. 30 tablet 1   No current facility-administered medications for this visit.    OBJECTIVE: Middle-aged white woman who  Filed Vitals:   04/29/15 0823  BP: 107/68  Pulse: 76  Temp: 97.9 F (36.6 C)  Resp: 18     Body mass index is 26.43 kg/(m^2).    ECOG FS:1 - Symptomatic but completely ambulatory  Sclerae unicteric, pupils round and  equal Oropharynx clear and moist-- no thrush or other lesions--minimal gum swelling very focally  No cervical or supraclavicular adenopathy Lungs no rales or rhonchi Heart regular rate and rhythm Abd  soft, nontender, positive bowel sounds MSK no focal spinal tenderness, no upper extremity lymphedema Neuro: nonfocal, well oriented, appropriate affect Breasts:  deferred   LAB RESULTS:  CMP     Component Value Date/Time   NA 135* 04/29/2015 0810   NA 138 03/18/2015 1653   NA 140 08/15/2014 1104   K 4.2 04/29/2015 0810   K 4.3 03/18/2015 1653   CL 104 03/18/2015 1653   CO2 25 04/29/2015 0810   CO2 25 03/18/2015 1653   GLUCOSE 98 04/29/2015 0810   GLUCOSE 170* 03/18/2015 1653   GLUCOSE 87 08/15/2014 1104   BUN 15.6 04/29/2015 0810   BUN 9 03/18/2015 1653   BUN 18 08/15/2014 1104   CREATININE 0.9 04/29/2015 0810   CREATININE 0.83 03/18/2015 1653   CALCIUM 9.1 04/29/2015 0810   CALCIUM 8.9 03/18/2015 1653   PROT 6.4 04/29/2015 0810   ALBUMIN 3.3* 04/29/2015 0810   AST 15 04/29/2015 0810   ALT 15 04/29/2015 0810   ALKPHOS 70 04/29/2015 0810   BILITOT 0.53 04/29/2015 0810   GFRNONAA >60 03/18/2015 1653   GFRAA >60 03/18/2015 1653    INo results found for: SPEP, UPEP  Lab Results  Component Value Date   WBC 2.9* 04/29/2015   NEUTROABS 0.8* 04/29/2015   HGB 12.4 04/29/2015   HCT 37.5 04/29/2015   MCV 87.7 04/29/2015   PLT 153 04/29/2015      Chemistry      Component Value Date/Time   NA 135* 04/29/2015 0810   NA 138 03/18/2015 1653   NA 140 08/15/2014 1104   K 4.2 04/29/2015 0810   K 4.3 03/18/2015 1653   CL 104 03/18/2015 1653   CO2 25 04/29/2015 0810   CO2 25 03/18/2015 1653   BUN 15.6 04/29/2015 0810   BUN 9 03/18/2015 1653   BUN 18 08/15/2014 1104   CREATININE 0.9 04/29/2015 0810   CREATININE 0.83 03/18/2015 1653      Component Value Date/Time   CALCIUM 9.1 04/29/2015 0810   CALCIUM 8.9 03/18/2015 1653   ALKPHOS 70 04/29/2015 0810   AST 15  04/29/2015 0810   ALT 15 04/29/2015 0810   BILITOT 0.53 04/29/2015 0810       No results found for: LABCA2  No components found for: LABCA125  No results for input(s): INR in the last 168 hours.  Urinalysis No results found for: COLORURINE, APPEARANCEUR, LABSPEC, PHURINE, GLUCOSEU, HGBUR, BILIRUBINUR, KETONESUR, PROTEINUR, UROBILINOGEN, NITRITE, LEUKOCYTESUR  STUDIES: No results found.   ASSESSMENT: 65 y.o. BRCA1 positive [place], [place] woman  (1) ovarian cancer stage IIIB diagnosed April 2003  (a) s/p supracervical hysterectomy with surgical staging  (b) treated according to GOG 182: carboplatin/ topotecan x4 followed by carboplatin/taxol x4  (c) last chemotherapy dose October 2003  (2) status post left breast needle core biopsy 01/28/2015 for an invasive ductal carcinoma, grade 2, estrogen and progesterone receptor positive, HER-2 not amplified, with an MIB-1 of 10%  (a) tamoxifen started neoadjuvantly due to concerns regarding surgical delay, interrupted with chemotherapy  (3) status post bilateral mastectomies with left axillary lymph node sampling 03/18/2015 showing  (a) on the right, benign disease  (b) on the left, a pT1c pN0, stage IA invasive ductal carcinoma, repeat HER-2 again negative, stage IA    (4) Oncotype DX score of 29, in the high intermediate range, predicts a risk of recurrence with tamoxifen alone of 19%. The addition of chemotherapy in this setting is predicted to decrease distant disease-free recurrence by approximately 6%.   (  5) adjuvant chemotherapy with cyclophosphamide and docetaxel started 04/22/2015, to be repeated every 21 days 4  (6) adjuvant anti-estrogens to follow local treatment  PLAN: Nereyda did generally quite well with her first cycle and hopefully subsequent cycles will be better. She can expect the bony aches to occur again on day 3 and she is taking care of these appropriately by titrating the Motrin. She is careful to take it with  food.  She can also expect the constipation. She is going to start Senokot and Metamucil on day 1 of each treatment. She can use Dulcolax as needed.  I suspect the headache is really not related but if it does she was able to take care of it with Motrin as well. I think it may be a good idea for her to take Claritin every day for the next 2 months to see if that can prevent the headache issue.  She is planning to have her head shaved this weekend and she already has a weight she is looking for to wearing.   We reviewed her nadir counts today and they are excellent. She knows to call us if the fever is to develop.  Otherwise she will return 2 weeks from now for cycle 2. She knows to call for any problems that may develop before then...  Chauncey Cruel, MD   04/29/2015 9:04 AM

## 2015-05-11 ENCOUNTER — Encounter: Payer: Self-pay | Admitting: Genetic Counselor

## 2015-05-11 ENCOUNTER — Ambulatory Visit (HOSPITAL_BASED_OUTPATIENT_CLINIC_OR_DEPARTMENT_OTHER): Payer: BC Managed Care – PPO | Admitting: Genetic Counselor

## 2015-05-11 DIAGNOSIS — Z803 Family history of malignant neoplasm of breast: Secondary | ICD-10-CM

## 2015-05-11 DIAGNOSIS — Z801 Family history of malignant neoplasm of trachea, bronchus and lung: Secondary | ICD-10-CM | POA: Diagnosis not present

## 2015-05-11 DIAGNOSIS — Z8543 Personal history of malignant neoplasm of ovary: Secondary | ICD-10-CM

## 2015-05-11 DIAGNOSIS — C569 Malignant neoplasm of unspecified ovary: Secondary | ICD-10-CM

## 2015-05-11 DIAGNOSIS — Z1509 Genetic susceptibility to other malignant neoplasm: Principal | ICD-10-CM

## 2015-05-11 DIAGNOSIS — Z800 Family history of malignant neoplasm of digestive organs: Secondary | ICD-10-CM

## 2015-05-11 DIAGNOSIS — C50412 Malignant neoplasm of upper-outer quadrant of left female breast: Secondary | ICD-10-CM | POA: Diagnosis not present

## 2015-05-11 DIAGNOSIS — Z15068 Genetic susceptibility to other malignant neoplasm of digestive system: Secondary | ICD-10-CM | POA: Insufficient documentation

## 2015-05-11 DIAGNOSIS — Z8052 Family history of malignant neoplasm of bladder: Secondary | ICD-10-CM

## 2015-05-11 DIAGNOSIS — Z1379 Encounter for other screening for genetic and chromosomal anomalies: Secondary | ICD-10-CM

## 2015-05-11 DIAGNOSIS — Z8481 Family history of carrier of genetic disease: Secondary | ICD-10-CM

## 2015-05-11 DIAGNOSIS — Z8042 Family history of malignant neoplasm of prostate: Secondary | ICD-10-CM

## 2015-05-11 DIAGNOSIS — Z1501 Genetic susceptibility to malignant neoplasm of breast: Secondary | ICD-10-CM | POA: Insufficient documentation

## 2015-05-11 DIAGNOSIS — Z808 Family history of malignant neoplasm of other organs or systems: Secondary | ICD-10-CM

## 2015-05-11 DIAGNOSIS — Z315 Encounter for genetic counseling: Secondary | ICD-10-CM

## 2015-05-11 NOTE — Progress Notes (Signed)
GENETIC TEST RESULTS   Patient Name: Vicki Hale Patient Age: 65 y.o. Encounter Date: 05/11/2015  Referring Provider: Lurline Del, MD    Vicki Hale was seen in the [facility] clinic on February 11, 2015 due to a personal and family history of cancer and a known family mutation in [place], and concern regarding a hereditary predisposition to cancer in the family. Please refer to the prior Genetics clinic note for more information regarding Vicki Hale's medical and family histories and our assessment at the time.   FAMILY HISTORY:  We obtained a detailed, 4-generation family history.  Significant diagnoses are listed below: Family History  Problem Relation Age of Onset  . Colon polyps Mother   . Colon cancer Neg Hx   . Rectal cancer Neg Hx   . Ulcerative colitis Neg Hx   . Stomach cancer Neg Hx   . Breast cancer Daughter 60  . BRCA 1/2 Daughter     BRCA1  . COPD Maternal Aunt   . Esophageal cancer Maternal Uncle   . Lung cancer Paternal Aunt   . Pancreatic cancer Maternal Grandmother     early 59s  . Prostate cancer Maternal Grandfather     92s  . Skin cancer Paternal Grandmother     untreated BCC  . COPD Maternal Aunt   . Lung cancer Maternal Aunt   . Breast cancer Cousin 56    maternal first cousin  . BRCA 1/2 Daughter     BRCA1  . Esophageal cancer Cousin     maternal first cousin  . Bladder Cancer Paternal Aunt     The patient has two daughters. One was diagnosed with breast cancer at age 28 and was found to be BRCA1 positive. The other daughter underwent genetic testing and also was found to be BRCA1 positive. The patient had one brother who died at 32 from suicide and one maternal half brother who is 78. Her parents are both deceased. Her father died in a car accident at 35 and her mother died at 12 and was cancer free. Her mother had four sisters and one brother. Her brother died from esophageal cancer, one sister died at 21 from COPD, another sister had  lung cancer. The patient's uncle had a duaghter with breast cancer at 12. The patient's maternal grandmother was diagnosed with pancreatic cancer in her early 78s and her grandfather died in his 40s from prostate cancer. The patient's father had two sisters, one who had bladder cancer and the other with lung cancer. Her paternal grandmother died from untreated [place]. Patient's maternal ancestors are of Caucasian descent, and paternal ancestors are of English descent. There is no reported Ashkenazi Jewish ancestry. There is no known consanguinity.   GENETIC TESTING:  At the time of Vicki Hale's visit, we recommended she pursue genetic testing for the specific BRCA1 mutation that was identified in the family. Testing, which was performed at [facility], revealed the familial mutation in the BRCA1 gene called [id number].  MEDICAL MANAGEMENT: Women who have a BRCA mutation have an increased risk for both breast and ovarian cancer.   Since Vicki Hale  has already had bilateral mastectomies, she has taken the most effective option available to reduce breast cancer risk. At this time, we recommend she continue to follow healthcare management guidelines that have been provided to her by her overseeing providers.   Since Vicki Hale  has already had a hysterectomy with removal of her ovaries, she has dramatically reduced her risk  of ovarian cancer. We therefore recommend she continue to follow healthcare management guidelines that have been provided to her by her overseeing healthcare providers.   RISK REDUCTION: There are several things that can be offered to individuals who are carriers for BRCA mutations that will reduce the risk for getting cancer.   The use of oral contraceptives can lower the risk for ovarian cancer, and, per case control studies, does not significantly increase the risk for breast cancer in BRCA patients. Case control studies have shown that oral contraceptives can lower the risk  for ovarian cancer in women with BRCA mutations. Additionally, a more recent meta-analysis, including one corhort (n=3,181) and one case control study (1,096 cases and 2,878 controls) also showed an inverse correlation between ovarian cancer and ever having used oral contraceptives (OR, 0.58; 95% CI = 0.46-0.73). Studies on oral contraceptives and breast cancer have been conflicting, with some studies suggesting that there is not an increased risk for breast cancer in BRCA mutation carriers, while others suggest that there could be a risk. That said, two meta-analysis studies have shown that there is not an increased risk for breast cancer with oral contraceptive use in BRCA1 and BRCA2 carriers.   In individuals who have a prophylactic bilateral salpino-oophorectomy (BSO), the risk for breast cancer is reduced by up to 50%. It has been reported that short term hormone replacement therapy in women undergoing prophylactic BSO does not negate the reduction of breast cancer risk associated with surgery (2.2016 NCCN guidelines).  Vicki Hale has a pancreatic cancer screening study for individuals who have a BRCA mutation and a family member with pancreatic cancer.  Vicki Hale was interested in this study. We will make the referral and they will contact her about an appointment.  Vicki Hale is also interested in considering additional testing through [facility] for additional genes based on her mother's colon polyps and a personal history of colon polyps that require her to get a colonoscopy every 3 years.  We will inquire about doing additional testing through [facility].  FAMILY MEMBERS: It is important that all of Vicki Hale's relatives (both men and women) know of the presence of this gene mutation. Site-specific genetic testing can sort out who in the family is at risk and who is not.   Vicki Hale children and siblings have a 50% chance to have inherited this mutation. Coincidentally, her  daughter Vicki Hale had been diagnosed with breast cancer at 65 and was the first in the family to be identified as a BRCA1 carrier.  Vicki Hale other daughter, Vicki Hale, was also tested and found to be a carrier for the BRCA1 mutation.  Ms. Ouderkirk full brother died at age 59, and she has a maternal half brother who we suspect is at 50% risk as well.  We recommend they have genetic testing for this same mutation, as identifying the presence of this mutation would allow them to also take advantage of risk-reducing measures.   Ms. Rauen niece, the daughter of her brother who died at 54, attended today's appointment.  This niece is at 25% risk for having this mutation and underwent genetic testing today.  SUPPORT AND RESOURCES: If Ms. Treadwell is interested in BRCA-specific information and support, there is a group, Facing Our Risk (www.facingourrisk.com). They provide opportunities to speak with other individuals from high-risk families. To locate genetic counselors in other cities, visit the website of the [employer] (ArtistMovie.se) and Secretary/administrator for a Social worker by zip code.  Ms. Garcia was also provided information about the [place].    We encouraged Ms. Moder to remain in contact with [place] on an annual basis so we can update her personal and family histories, and let her know of advances in cancer genetics that may benefit the family. Our contact number was provided. Ms. Gaulin questions were answered to her satisfaction today, and she knows she is welcome to call anytime with additional questions.   Khizar Fiorella P. Florene [place], [place], [facility] Certified Genetic Counselor [email or web address] phone: [phone]

## 2015-05-12 ENCOUNTER — Telehealth: Payer: Self-pay | Admitting: Genetic Counselor

## 2015-05-12 NOTE — Telephone Encounter (Signed)
LM on VM to please call back about extended testing through [facility].

## 2015-05-13 ENCOUNTER — Other Ambulatory Visit (HOSPITAL_BASED_OUTPATIENT_CLINIC_OR_DEPARTMENT_OTHER): Payer: BC Managed Care – PPO

## 2015-05-13 ENCOUNTER — Other Ambulatory Visit: Payer: Self-pay | Admitting: Oncology

## 2015-05-13 ENCOUNTER — Ambulatory Visit (HOSPITAL_BASED_OUTPATIENT_CLINIC_OR_DEPARTMENT_OTHER): Payer: BC Managed Care – PPO

## 2015-05-13 ENCOUNTER — Encounter: Payer: Self-pay | Admitting: *Deleted

## 2015-05-13 ENCOUNTER — Ambulatory Visit (HOSPITAL_BASED_OUTPATIENT_CLINIC_OR_DEPARTMENT_OTHER): Payer: BC Managed Care – PPO | Admitting: Nurse Practitioner

## 2015-05-13 ENCOUNTER — Encounter: Payer: Self-pay | Admitting: Nurse Practitioner

## 2015-05-13 VITALS — BP 134/58 | HR 81 | Temp 97.8°F | Resp 18 | Wt 170.8 lb

## 2015-05-13 DIAGNOSIS — C50412 Malignant neoplasm of upper-outer quadrant of left female breast: Secondary | ICD-10-CM | POA: Diagnosis not present

## 2015-05-13 DIAGNOSIS — Z8543 Personal history of malignant neoplasm of ovary: Secondary | ICD-10-CM | POA: Diagnosis not present

## 2015-05-13 DIAGNOSIS — Z170 Estrogen receptor positive status [ER+]: Secondary | ICD-10-CM | POA: Diagnosis not present

## 2015-05-13 DIAGNOSIS — Z5111 Encounter for antineoplastic chemotherapy: Secondary | ICD-10-CM | POA: Diagnosis not present

## 2015-05-13 LAB — CBC WITH DIFFERENTIAL/PLATELET
BASO%: 0.4 % (ref 0.0–2.0)
Basophils Absolute: 0 10*3/uL (ref 0.0–0.1)
EOS%: 0 % (ref 0.0–7.0)
Eosinophils Absolute: 0 10*3/uL (ref 0.0–0.5)
HCT: 37.7 % (ref 34.8–46.6)
HGB: 12.2 g/dL (ref 11.6–15.9)
LYMPH%: 9.9 % — ABNORMAL LOW (ref 14.0–49.7)
MCH: 28.8 pg (ref 25.1–34.0)
MCHC: 32.3 g/dL (ref 31.5–36.0)
MCV: 89.3 fL (ref 79.5–101.0)
MONO#: 0.6 10*3/uL (ref 0.1–0.9)
MONO%: 6.2 % (ref 0.0–14.0)
NEUT#: 7.5 10*3/uL — ABNORMAL HIGH (ref 1.5–6.5)
NEUT%: 83.5 % — ABNORMAL HIGH (ref 38.4–76.8)
Platelets: 525 10*3/uL — ABNORMAL HIGH (ref 145–400)
RBC: 4.22 10*6/uL (ref 3.70–5.45)
RDW: 13.8 % (ref 11.2–14.5)
WBC: 9 10*3/uL (ref 3.9–10.3)
lymph#: 0.9 10*3/uL (ref 0.9–3.3)

## 2015-05-13 LAB — COMPREHENSIVE METABOLIC PANEL
ALT: 17 U/L (ref 0–55)
AST: 15 U/L (ref 5–34)
Albumin: 3.8 g/dL (ref 3.5–5.0)
Alkaline Phosphatase: 78 U/L (ref 40–150)
Anion Gap: 12 mEq/L — ABNORMAL HIGH (ref 3–11)
BUN: 18.4 mg/dL (ref 7.0–26.0)
CO2: 21 mEq/L — ABNORMAL LOW (ref 22–29)
Calcium: 10 mg/dL (ref 8.4–10.4)
Chloride: 111 mEq/L — ABNORMAL HIGH (ref 98–109)
Creatinine: 0.9 mg/dL (ref 0.6–1.1)
EGFR: 72 mL/min/{1.73_m2} — ABNORMAL LOW (ref >90)
Glucose: 111 mg/dl (ref 70–140)
Potassium: 4 mEq/L (ref 3.5–5.1)
Sodium: 143 mEq/L (ref 136–145)
Total Bilirubin: 0.31 mg/dL (ref 0.20–1.20)
Total Protein: 7.2 g/dL (ref 6.4–8.3)

## 2015-05-13 MED ORDER — SODIUM CHLORIDE 0.9 % IV SOLN
Freq: Once | INTRAVENOUS | Status: AC
  Administered 2015-05-13: 10:00:00 via INTRAVENOUS

## 2015-05-13 MED ORDER — SODIUM CHLORIDE 0.9 % IV SOLN
10.0000 mg | Freq: Once | INTRAVENOUS | Status: AC
  Administered 2015-05-13: 10 mg via INTRAVENOUS
  Filled 2015-05-13: qty 1

## 2015-05-13 MED ORDER — SODIUM CHLORIDE 0.9 % IV SOLN
600.0000 mg/m2 | Freq: Once | INTRAVENOUS | Status: AC
  Administered 2015-05-13: 1160 mg via INTRAVENOUS
  Filled 2015-05-13: qty 58

## 2015-05-13 MED ORDER — PEGFILGRASTIM 6 MG/0.6ML ~~LOC~~ PSKT
6.0000 mg | PREFILLED_SYRINGE | Freq: Once | SUBCUTANEOUS | Status: AC
  Administered 2015-05-13: 6 mg via SUBCUTANEOUS
  Filled 2015-05-13: qty 0.6

## 2015-05-13 MED ORDER — PALONOSETRON HCL INJECTION 0.25 MG/5ML
0.2500 mg | Freq: Once | INTRAVENOUS | Status: AC
  Administered 2015-05-13: 0.25 mg via INTRAVENOUS

## 2015-05-13 MED ORDER — PALONOSETRON HCL INJECTION 0.25 MG/5ML
INTRAVENOUS | Status: AC
  Filled 2015-05-13: qty 5

## 2015-05-13 MED ORDER — SODIUM CHLORIDE 0.9 % IV SOLN
75.0000 mg/m2 | Freq: Once | INTRAVENOUS | Status: AC
  Administered 2015-05-13: 140 mg via INTRAVENOUS
  Filled 2015-05-13: qty 14

## 2015-05-13 NOTE — Patient Instructions (Signed)
[facility] Discharge Instructions for Patients Receiving Chemotherapy  Today you received the following chemotherapy agents;  Taxotere and Cytoxan.    To help prevent nausea and vomiting after your treatment, we encourage you to take your nausea medication as directed.     If you develop nausea and vomiting that is not controlled by your nausea medication, call the clinic.   BELOW ARE SYMPTOMS THAT SHOULD BE REPORTED IMMEDIATELY:  *FEVER GREATER THAN 100.5 F  *CHILLS WITH OR WITHOUT FEVER  NAUSEA AND VOMITING THAT IS NOT CONTROLLED WITH YOUR NAUSEA MEDICATION  *UNUSUAL SHORTNESS OF BREATH  *UNUSUAL BRUISING OR BLEEDING  TENDERNESS IN MOUTH AND THROAT WITH OR WITHOUT PRESENCE OF ULCERS  *URINARY PROBLEMS  *BOWEL PROBLEMS  UNUSUAL RASH Items with * indicate a potential emergency and should be followed up as soon as possible.  Feel free to call the clinic you have any questions or concerns. The clinic phone number is [phone].  Please show the CHEMO ALERT CARD at check-in to the Emergency Department and triage nurse.   

## 2015-05-13 NOTE — Progress Notes (Signed)
[place]  [phone] [phone]   ID: Vicki Hale DOB: [birth year]  MR#: [id number]  FWY#:[id number]  Patient Care Team: Leonides Sake, MD as PCP - General (Family Medicine) Chauncey Cruel, MD as Consulting Physician (Oncology) Rolm Bookbinder, MD as Consulting Physician (General Surgery) Consuella Lose, MD as Consulting Physician (Neurosurgery) Christene Slates, MD as Physician Assistant (Radiology) Rosalin Hawking, MD as Consulting Physician (Neurology) PCP: Leonides Sake, MD GYN: OTHER MD:  CHIEF COMPLAINT: Breast cancer in likely BRCA carrier  CURRENT TREATMENT: Adjuvant chemotherapy  CANCER HISTORY: From the original intake note:  Vicki Hale was diagnosed with ovarian cancer in April 2003. She underwent total abdominal hysterectomy and bilateral salpingo-oophorectomy with a full staging operation at [facility] that month and was then treated according to GOG 182, with 4 cycles of carboplatin/topotecan and then 4 cycles of carboplatin/paclitaxel. She was then followed by gynecologic oncology through 2011, and there has been no history of recurrent disease.  On 27/74/1287 Bennetta underwent screening mammography at [facility] with tomosynthesis. Breast density was category A. New grouped calcifications were noted in the left breast upper outer quadrant and the patient was recalled for diagnostic unilateral left mammography 01/26/2015. This confirmed a group of new linear calcifications in the upper outer quadrant of the left breast extending over an area of 2.0 cm..   On 01/28/2015 the patient underwent biopsy of her left breast, with the pathology ([id number]) showing an invasive ductal carcinoma, grade 2, estrogen receptor 100% positive, progesterone receptor 50% positive, both with strong staining intensity, with an MIB-1 of 10% and no HER-2 amplification, the signals ratio being 1.21 and the number per cell 2.00.  Her subsequent history is as detailed  below  INTERVAL HISTORY: Vicki Hale returns today for follow-up of her estrogen receptor positive breast. Today is day 1, cycle 2 of 4 planned cycles of cyclophosphamide and docetaxel given 21 days apart with onpro support.  REVIEW OF SYSTEMS: Vicki Hale denies fevers, chills, nausea, or vomiting. She is less constipated this week. She denies mouth sores, rashes, or neuropathy symptoms. Her appetite is good. Her energy level is unchanged. She is in no pain outside of her reconstructed breast. A detailed review of systems is otherwise stable.  Past Medical History  Diagnosis Date  . Ovarian cancer ([place])   . Stroke ([place])   . Breast cancer ([place]) 2017    [facility] in [place]  . Family history of breast cancer   . FH: BRCA1 gene positive   . Family history of pancreatic cancer   . SAH (subarachnoid hemorrhage) ([place])     04/2014  . Pericarditis, acute     30 YRS AGO   . Arthritis     PAST SURGICAL HISTORY: Past Surgical History  Procedure Laterality Date  . Tonsillectomy    . Hernia repair    . Total abdominal hysterectomy w/ bilateral salpingoophorectomy    . Cesarean section      X2  . Mastectomy w/ sentinel node biopsy Left 03/18/2015    Procedure: LEFT TOTAL MASTECTOMY WITH LEFT AXILLARY SENTINEL LYMPH NODE BIOPSY;  Surgeon: Excell Seltzer, MD;  Location: [place];  Service: General;  Laterality: Left;  Marland Kitchen Mastectomy, partial Right 03/18/2015    Procedure: RIGHT PROPHYLACTIC MASTECTOMY ;  Surgeon: Excell Seltzer, MD;  Location: [place];  Service: General;  Laterality: Right;  . Breast reconstruction with placement of tissue expander and flex hd (acellular hydrated dermis) Bilateral 03/18/2015    Procedure: BILATERAL BREAST RECONSTRUCTION WITH PLACEMENT OF  TISSUE EXPANDER AND FLEX HD (ACELLULAR HYDRATED DERMIS);  Surgeon: Loel Lofty Dillingham, DO;  Location: [place];  Service: Plastics;  Laterality: Bilateral;    FAMILY HISTORY Family History  Problem Relation Age of Onset  . Colon polyps Mother   .  Colon cancer Neg Hx   . Rectal cancer Neg Hx   . Ulcerative colitis Neg Hx   . Stomach cancer Neg Hx   . Breast cancer Daughter 77  . BRCA 1/2 Daughter     BRCA1  . COPD Maternal Aunt   . Esophageal cancer Maternal Uncle   . Lung cancer Paternal Aunt   . Pancreatic cancer Maternal Grandmother     early 62s  . Prostate cancer Maternal Grandfather     54s  . Skin cancer Paternal Grandmother     untreated BCC  . COPD Maternal Aunt   . Lung cancer Maternal Aunt   . Breast cancer Cousin 33    maternal first cousin  . BRCA 1/2 Daughter     BRCA1  . Esophageal cancer Cousin     maternal first cousin  . Bladder Cancer Paternal Aunt    the patient's 2 daughters have been tested for the BRCA gene and both are positive. One of them had breast cancer at the age of 56. In addition on the maternal side there is pancreatic prostate breast and esophageal cancer. The patient's father died at the age of 43 and a car accident and there is very little information on his side of the family. The patient's mother died at 87 from COPD. She had had a hysterectomy in her 24s. The patient's mother had 5 siblings several from died young. The patient herself had no sisters, 2 brothers.  GYNECOLOGIC HISTORY:  No LMP recorded. Patient has had a hysterectomy.  Menarche age 61, first live birth age 68, the patient is Vicki Hale P2. She underwent TAH/BSO in 2003 for her ovarian cancer. She took hormone replacement for approximately 6 months. She was on oral contraceptives for approximately 4 years remotely, with no complications.  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 Her husband Shanon Brow works for a [employer]. Daughter Debar Plate lives in [place] where she works as a school principal. She is 77 years old. Daughter Brittney Mucha lives in [place] and is a Pharmacist, hospital. She is 64 years old. These ages are as of January 2017. The patient has 4  grandchildren. She attends a [employer]    ADVANCED DIRECTIVES: Not in place   HEALTH MAINTENANCE: Social History  Substance Use Topics  . Smoking status: Never Smoker   . Smokeless tobacco: Never Used  . Alcohol Use: 0.0 oz/week    0 Standard drinks or equivalent per week     Comment: ocassional; once monthly     Colonoscopy: September 2016/ [facility]  PAP:  Bone density: 2016  Lipid panel:  Allergies  Allergen Reactions  . Penicillins Itching and Rash    Has patient had a PCN reaction causing immediate rash, facial/tongue/throat swelling, SOB or lightheadedness with hypotension: No Has patient had a PCN reaction causing severe rash involving mucus membranes or skin necrosis: No Has patient had a PCN reaction that required hospitalization No Has patient had a PCN reaction occurring within the last 10 years: No If all of the above answers are "NO", then may proceed with Cephalosporin use.     Current Outpatient Prescriptions  Medication Sig Dispense Refill  . Ascorbic Acid (VITAMIN C) 1000 MG tablet Take 1,000 mg by mouth 2 (two) times daily.    . B Complex Vitamins (B COMPLEX PO) Take 2 tablets by mouth daily.    Marland Kitchen dexamethasone (DECADRON) 4 MG tablet Take 2 tablets (8 mg total) by mouth 2 (two) times daily. Start the day before Taxotere. Then again the day after chemo for 3 days. 30 tablet 1  . LORazepam (ATIVAN) 0.5 MG tablet Take daily at bedtime as needed for insomnia 30 tablet 0  . Multiple Vitamin (MULTIVITAMIN WITH MINERALS) TABS tablet Take 2 tablets by mouth daily.    . prochlorperazine (COMPAZINE) 10 MG tablet Take 1 tablet (10 mg total) by mouth every 6 (six) hours as needed (Nausea or vomiting). 30 tablet 1  . valACYclovir (VALTREX) 500 MG tablet Take 1 tablet (500 mg total) by mouth daily. 30 tablet 1  . ibuprofen (ADVIL,MOTRIN) 200 MG tablet Take 200 mg by mouth every 8 (eight) hours as needed. Reported on 05/13/2015    . ibuprofen (ADVIL,MOTRIN) 200 MG  tablet Take 400-800 mg by mouth 2 (two) times daily as needed. Reported on 05/13/2015    . ondansetron (ZOFRAN) 8 MG tablet Take 1 tablet (8 mg total) by mouth 2 (two) times daily as needed for refractory nausea / vomiting. Start on day 3 after chemo. (Patient not taking: Reported on 04/20/2015) 30 tablet 1   No current facility-administered medications for this visit.    OBJECTIVE: Middle-aged white woman who  Filed Vitals:   05/13/15 0909  BP: 134/58  Pulse: 81  Temp: 97.8 F (36.6 C)  Resp: 18     Body mass index is 26.74 kg/(m^2).    ECOG FS:1 - Symptomatic but completely ambulatory  Skin: warm, dry  HEENT: sclerae anicteric, conjunctivae pink, oropharynx clear. No thrush or mucositis.  Lymph Nodes: No cervical or supraclavicular lymphadenopathy  Lungs: clear to auscultation bilaterally, no rales, wheezes, or rhonci  Heart: regular rate and rhythm  Abdomen: round, soft, non tender, positive bowel sounds  Musculoskeletal: No focal spinal tenderness, no peripheral edema  Neuro: non focal, well oriented, positive affect  Breasts: deferred  LAB RESULTS:  CMP     Component Value Date/Time   NA 143 05/13/2015 0850   NA 138 03/18/2015 1653   NA 140 08/15/2014 1104   K 4.0 05/13/2015 0850   K 4.3 03/18/2015 1653   CL 104 03/18/2015 1653   CO2 21* 05/13/2015 0850   CO2 25 03/18/2015 1653   GLUCOSE 111 05/13/2015 0850   GLUCOSE 170* 03/18/2015 1653   GLUCOSE 87 08/15/2014 1104   BUN 18.4 05/13/2015 0850   BUN 9 03/18/2015 1653   BUN 18 08/15/2014 1104   CREATININE 0.9 05/13/2015 0850   CREATININE 0.83 03/18/2015 1653   CALCIUM 10.0 05/13/2015 0850   CALCIUM 8.9 03/18/2015 1653   PROT 7.2 05/13/2015 0850   ALBUMIN 3.8 05/13/2015 0850   AST 15 05/13/2015 0850   ALT 17 05/13/2015 0850   ALKPHOS 78 05/13/2015 0850   BILITOT 0.31 05/13/2015 0850   GFRNONAA >60 03/18/2015 1653   GFRAA >60 03/18/2015 1653    INo results found for: SPEP, UPEP  Lab Results  Component Value  Date   WBC 9.0 05/13/2015   NEUTROABS 7.5* 05/13/2015   HGB 12.2 05/13/2015   HCT 37.7 05/13/2015   MCV 89.3 05/13/2015   PLT 525* 05/13/2015      Chemistry  Component Value Date/Time   NA 143 05/13/2015 0850   NA 138 03/18/2015 1653   NA 140 08/15/2014 1104   K 4.0 05/13/2015 0850   K 4.3 03/18/2015 1653   CL 104 03/18/2015 1653   CO2 21* 05/13/2015 0850   CO2 25 03/18/2015 1653   BUN 18.4 05/13/2015 0850   BUN 9 03/18/2015 1653   BUN 18 08/15/2014 1104   CREATININE 0.9 05/13/2015 0850   CREATININE 0.83 03/18/2015 1653      Component Value Date/Time   CALCIUM 10.0 05/13/2015 0850   CALCIUM 8.9 03/18/2015 1653   ALKPHOS 78 05/13/2015 0850   AST 15 05/13/2015 0850   ALT 17 05/13/2015 0850   BILITOT 0.31 05/13/2015 0850       No results found for: LABCA2  No components found for: LABCA125  No results for input(s): INR in the last 168 hours.  Urinalysis No results found for: COLORURINE, APPEARANCEUR, LABSPEC, PHURINE, GLUCOSEU, HGBUR, BILIRUBINUR, KETONESUR, PROTEINUR, UROBILINOGEN, NITRITE, LEUKOCYTESUR  STUDIES: No results found.   ASSESSMENT: 65 y.o. BRCA1 positive Vicki Hale, [place] woman  (1) ovarian cancer stage IIIB diagnosed April 2003  (a) s/p supracervical hysterectomy with surgical staging  (b) treated according to GOG 182: carboplatin/ topotecan x4 followed by carboplatin/taxol x4  (c) last chemotherapy dose October 2003  (2) status post left breast needle core biopsy 01/28/2015 for an invasive ductal carcinoma, grade 2, estrogen and progesterone receptor positive, HER-2 not amplified, with an MIB-1 of 10%  (a) tamoxifen started neoadjuvantly due to concerns regarding surgical delay, interrupted with chemotherapy  (3) status post bilateral mastectomies with left axillary lymph node sampling 03/18/2015 showing  (a) on the right, benign disease  (b) on the left, a pT1c pN0, stage IA invasive ductal carcinoma, repeat HER-2 again negative, stage IA     (4) Oncotype DX score of 29, in the high intermediate range, predicts a risk of recurrence with tamoxifen alone of 19%. The addition of chemotherapy in this setting is predicted to decrease distant disease-free recurrence by approximately 6%.   (5) adjuvant chemotherapy with cyclophosphamide and docetaxel started 04/22/2015, to be repeated every 21 days 4  (6) adjuvant anti-estrogens to follow local treatment  PLAN: Vicki Hale looks and feels great today. The labs were reviewed in detail and were entirely stable. She will proceed with cycle 2 of cyclophosphamide and docetaxel as planned today.   Vicki Hale will return in 1 week for labs and a nadir visit. She understands and agrees with this plan. She knows the goal of treatment in her case is cure. She has been encouraged to call with any issues that might arise before her next visit here.   Vicki Panda, NP   05/13/2015 9:30 AM

## 2015-05-14 ENCOUNTER — Ambulatory Visit (HOSPITAL_BASED_OUTPATIENT_CLINIC_OR_DEPARTMENT_OTHER): Payer: BC Managed Care – PPO | Admitting: Nurse Practitioner

## 2015-05-14 ENCOUNTER — Telehealth: Payer: Self-pay

## 2015-05-14 ENCOUNTER — Other Ambulatory Visit: Payer: Self-pay

## 2015-05-14 ENCOUNTER — Ambulatory Visit: Payer: BC Managed Care – PPO

## 2015-05-14 VITALS — BP 106/41 | HR 75 | Temp 98.8°F | Resp 18 | Ht 67.0 in | Wt 173.5 lb

## 2015-05-14 DIAGNOSIS — T7840XA Allergy, unspecified, initial encounter: Secondary | ICD-10-CM

## 2015-05-14 DIAGNOSIS — C50412 Malignant neoplasm of upper-outer quadrant of left female breast: Secondary | ICD-10-CM

## 2015-05-14 MED ORDER — FAMOTIDINE 20 MG PO TABS
20.0000 mg | ORAL_TABLET | Freq: Once | ORAL | Status: AC
  Administered 2015-05-14: 20 mg via ORAL

## 2015-05-14 MED ORDER — FAMOTIDINE 20 MG PO TABS
ORAL_TABLET | ORAL | Status: AC
  Filled 2015-05-14: qty 1

## 2015-05-14 MED ORDER — METHYLPREDNISOLONE SODIUM SUCC 125 MG IJ SOLR
80.0000 mg | Freq: Once | INTRAMUSCULAR | Status: AC
  Administered 2015-05-14: 80 mg via INTRAMUSCULAR

## 2015-05-14 MED ORDER — METHYLPREDNISOLONE SODIUM SUCC 40 MG IJ SOLR
INTRAMUSCULAR | Status: AC
Start: 2015-05-14 — End: 2015-05-14
  Filled 2015-05-14: qty 2

## 2015-05-14 NOTE — Telephone Encounter (Signed)
Patient had her second cycle of chemotherapy yesterday 06/12/15 and states that when she arrived home from the infusion- she was bright red in her cheeks.  Today she got up from a nap and now has a "rash from her neck to mid thigh", she reports that it is " red and raised, itchy too".  Patient admits to taking two benadryl tabs in the last hour. Per Selena Lesser, NP patient should come in to be checked.  Writer called her back and patient states she can be here in 30-35 minutes.  Writer encouraged her to get a driver due to the benadryl.  Patient stated understanding.

## 2015-05-14 NOTE — Progress Notes (Signed)
Solumedrol given IM and pepcid given PO, pt brought back to infusion to be observed. CB to see patient again before pt  leaves

## 2015-05-14 NOTE — Progress Notes (Signed)
Pt being monitored per C. Berniece Salines for previous taxane reaction.  Solumedrol and pepcid given in symptom management.    Vicki Hale to infusion to discuss plan with pt.  Pt ok to discharge per C. Berniece Salines, NP

## 2015-05-15 ENCOUNTER — Ambulatory Visit: Payer: BC Managed Care – PPO

## 2015-05-15 ENCOUNTER — Encounter: Payer: Self-pay | Admitting: Nurse Practitioner

## 2015-05-15 DIAGNOSIS — T7840XA Allergy, unspecified, initial encounter: Secondary | ICD-10-CM | POA: Insufficient documentation

## 2015-05-15 MED ORDER — DEXAMETHASONE 4 MG PO TABS: 8.0000 mg | ORAL_TABLET | Freq: Two times a day (BID) | ORAL | Status: DC

## 2015-05-15 NOTE — Progress Notes (Signed)
SYMPTOM MANAGEMENT CLINIC    Chief Complaint: Hypersensitivity reaction  HPI:  Vicki Hale 65 y.o. female diagnosed with breast cancer.  Currently undergoing Taxotere/Cytoxan chemotherapy regimen.    Patient received cycle 2 of her Taxotere/Cytoxan chemotherapy regimen on 05/13/2015.  Patient states that she had redness to entire face by the time she had driven home.  By the next morning-she had a full body rash/erythema; and some mild pruritus.  She denies any issues managing her secretions or her airway.  She denies any other new symptoms.  Patient states that she took Benadryl 50 mg prior to coming to the [facility] today.  Exam today reveals significant, diarrhea, diffuse rash from patient's face down to her knees.  Increased erythema to the chest area.  Patient is scratching occasionally.  Airway appears intact and patient is managing all oral secretions.  Vital signs were stable and patient was afebrile.  Patient was given Solu-Medrol 80 mg IM; and Pepcid 20 mg orally.  Patient was monitored closely for proximally one hour; and patient's rash/erythema was slowly improving.  Reviewed all findings with Dr. Francis Dowse he suggested that patient increase her dexamethasone to 8 mg 3 times per day for the next 2-3 days.  Dr. Jana Hakim advised he would change patient's chemotherapy to something that would be better tolerated. Will place Taxotere on pt's allergy list.   Also, patient was advised to go directly to the emergency department for any worsening symptoms whatsoever.  No history exists.    Review of Systems  Skin: Positive for itching and rash.  All other systems reviewed and are negative.   Past Medical History  Diagnosis Date  . Ovarian cancer ([place])   . Stroke ([place])   . Breast cancer ([place]) 2017    [facility] in [place]  . Family history of breast cancer   . FH: BRCA1 gene positive   . Family history of pancreatic cancer   . SAH (subarachnoid hemorrhage) ([place])       04/2014  . Pericarditis, acute     30 YRS AGO   . Arthritis     Past Surgical History  Procedure Laterality Date  . Tonsillectomy    . Hernia repair    . Total abdominal hysterectomy w/ bilateral salpingoophorectomy    . Cesarean section      X2  . Mastectomy w/ sentinel node biopsy Left 03/18/2015    Procedure: LEFT TOTAL MASTECTOMY WITH LEFT AXILLARY SENTINEL LYMPH NODE BIOPSY;  Surgeon: Excell Seltzer, MD;  Location: [place];  Service: General;  Laterality: Left;  Marland Kitchen Mastectomy, partial Right 03/18/2015    Procedure: RIGHT PROPHYLACTIC MASTECTOMY ;  Surgeon: Excell Seltzer, MD;  Location: [facility];  Service: General;  Laterality: Right;  . Breast reconstruction with placement of tissue expander and flex hd (acellular hydrated dermis) Bilateral 03/18/2015    Procedure: BILATERAL BREAST RECONSTRUCTION WITH PLACEMENT OF TISSUE EXPANDER AND FLEX HD (ACELLULAR HYDRATED DERMIS);  Surgeon: Loel Lofty Dillingham, DO;  Location: [facility];  Service: Plastics;  Laterality: Bilateral;    has SAH (subarachnoid hemorrhage) ([place]); Breast cancer of upper-outer quadrant of left female breast ([place]); Family history of breast cancer; FH: BRCA1 gene positive; Family history of pancreatic cancer; Ovarian cancer, BRCA1 positive ([place]); Genetic testing; BRCA1 positive; and Hypersensitivity reaction on her problem list.    is allergic to taxotere and penicillins.    Medication List       This list is accurate as of: 05/14/15 11:59 PM.  Always use your  most recent med list.               B COMPLEX PO  Take 2 tablets by mouth daily.     dexamethasone 4 MG tablet  Commonly known as:  DECADRON  Take 2 tablets (8 mg total) by mouth 2 (two) times daily. Start the day before Taxotere. Then again the day after chemo for 3 days.     ibuprofen 200 MG tablet  Commonly known as:  ADVIL,MOTRIN  Take 200 mg by mouth every 8 (eight) hours as needed. Reported on 05/13/2015     ibuprofen 200 MG tablet  Commonly known  as:  ADVIL,MOTRIN  Take 400-800 mg by mouth 2 (two) times daily as needed. Reported on 05/13/2015     LORazepam 0.5 MG tablet  Commonly known as:  ATIVAN  Take daily at bedtime as needed for insomnia     multivitamin with minerals Tabs tablet  Take 2 tablets by mouth daily.     ondansetron 8 MG tablet  Commonly known as:  ZOFRAN  Take 1 tablet (8 mg total) by mouth 2 (two) times daily as needed for refractory nausea / vomiting. Start on day 3 after chemo.     prochlorperazine 10 MG tablet  Commonly known as:  COMPAZINE  Take 1 tablet (10 mg total) by mouth every 6 (six) hours as needed (Nausea or vomiting).     valACYclovir 500 MG tablet  Commonly known as:  VALTREX  Take 1 tablet (500 mg total) by mouth daily.     vitamin C 1000 MG tablet  Take 1,000 mg by mouth 2 (two) times daily.         PHYSICAL EXAMINATION  Oncology Vitals 05/14/2015 05/13/2015  Height 170 cm -  Weight 78.699 kg 77.474 kg  Weight (lbs) 173 lbs 8 oz 170 lbs 13 oz  BMI (kg/m2) 27.17 kg/m2 -  Temp 98.8 97.8  Pulse 75 81  Resp 18 18  SpO2 98 99  BSA (m2) 1.93 m2 -   BP Readings from Last 2 Encounters:  05/14/15 106/41  05/13/15 134/58    Physical Exam  Constitutional: She is oriented to person, place, and time and well-developed, well-nourished, and in no distress.  HENT:  Head: Normocephalic and atraumatic.  Eyes: Conjunctivae and EOM are normal. Pupils are equal, round, and reactive to light. Right eye exhibits no discharge. Left eye exhibits no discharge. No scleral icterus.  Neck: Normal range of motion.  Pulmonary/Chest: Effort normal. No respiratory distress.  Musculoskeletal: Normal range of motion.  Neurological: She is alert and oriented to person, place, and time. Gait normal.  Skin: Skin is warm and dry. Rash noted. There is erythema.  Psychiatric: Affect normal.  Nursing note and vitals reviewed.   LABORATORY DATA:. Appointment on 05/13/2015  Component Date Value Ref Range  Status  . WBC 05/13/2015 9.0  3.9 - 10.3 10e3/uL Final  . NEUT# 05/13/2015 7.5* 1.5 - 6.5 10e3/uL Final  . HGB 05/13/2015 12.2  11.6 - 15.9 g/dL Final  . HCT 05/13/2015 37.7  34.8 - 46.6 % Final  . Platelets 05/13/2015 525* 145 - 400 10e3/uL Final  . MCV 05/13/2015 89.3  79.5 - 101.0 fL Final  . MCH 05/13/2015 28.8  25.1 - 34.0 pg Final  . MCHC 05/13/2015 32.3  31.5 - 36.0 g/dL Final  . RBC 05/13/2015 4.22  3.70 - 5.45 10e6/uL Final  . RDW 05/13/2015 13.8  11.2 - 14.5 % Final  . lymph# 05/13/2015  0.9  0.9 - 3.3 10e3/uL Final  . MONO# 05/13/2015 0.6  0.1 - 0.9 10e3/uL Final  . Eosinophils Absolute 05/13/2015 0.0  0.0 - 0.5 10e3/uL Final  . Basophils Absolute 05/13/2015 0.0  0.0 - 0.1 10e3/uL Final  . NEUT% 05/13/2015 83.5* 38.4 - 76.8 % Final  . LYMPH% 05/13/2015 9.9* 14.0 - 49.7 % Final  . MONO% 05/13/2015 6.2  0.0 - 14.0 % Final  . EOS% 05/13/2015 0.0  0.0 - 7.0 % Final  . BASO% 05/13/2015 0.4  0.0 - 2.0 % Final  . Sodium 05/13/2015 143  136 - 145 mEq/L Final  . Potassium 05/13/2015 4.0  3.5 - 5.1 mEq/L Final  . Chloride 05/13/2015 111* 98 - 109 mEq/L Final  . CO2 05/13/2015 21* 22 - 29 mEq/L Final  . Glucose 05/13/2015 111  70 - 140 mg/dl Final   Glucose reference range is for nonfasting patients. Fasting glucose reference range is 70- 100.  Marland Kitchen BUN 05/13/2015 18.4  7.0 - 26.0 mg/dL Final  . Creatinine 05/13/2015 0.9  0.6 - 1.1 mg/dL Final  . Total Bilirubin 05/13/2015 0.31  0.20 - 1.20 mg/dL Final  . Alkaline Phosphatase 05/13/2015 78  40 - 150 U/L Final  . AST 05/13/2015 15  5 - 34 U/L Final  . ALT 05/13/2015 17  0 - 55 U/L Final  . Total Protein 05/13/2015 7.2  6.4 - 8.3 g/dL Final  . Albumin 05/13/2015 3.8  3.5 - 5.0 g/dL Final  . Calcium 05/13/2015 10.0  8.4 - 10.4 mg/dL Final  . Anion Gap 05/13/2015 12* 3 - 11 mEq/L Final  . EGFR 05/13/2015 72* >90 ml/min/1.73 m2 Final   eGFR is calculated using the CKD-EPI Creatinine Equation (2009)      RADIOGRAPHIC STUDIES: No  results found.  ASSESSMENT/PLAN:    Breast cancer of upper-outer quadrant of left female breast [facility]) Patient received cycle 2 of her Taxotere/Cytoxan chemotherapy on 05/13/2015.  The patient developed a hypersensitivity reaction to her chemotherapy with a diffuse rash.  See further notes for details.  Patient is scheduled to return for labs and a follow-up visit on 05/21/2015.  Hypersensitivity reaction Patient received cycle 2 of her Taxotere/Cytoxan chemotherapy regimen on 05/13/2015.  Patient states that she had redness to entire face by the time she had driven home.  By the next morning-she had a full body rash/erythema; and some mild pruritus.  She denies any issues managing her secretions or her airway.  She denies any other new symptoms.  Patient states that she took Benadryl 50 mg prior to coming to the [facility] today.  Exam today reveals significant, diarrhea, diffuse rash from patient's face down to her knees.  Increased erythema to the chest area.  Patient is scratching occasionally.  Airway appears intact and patient is managing all oral secretions.  Vital signs were stable and patient was afebrile.  Patient was given Solu-Medrol 80 mg IM; and Pepcid 20 mg orally.  Patient was monitored closely for proximally one hour; and patient's rash/erythema was slowly improving.  Reviewed all findings with Dr. Francis Dowse he suggested that patient increase her dexamethasone to 8 mg 3 times per day for the next 2-3 days.  Dr. Jana Hakim advised he would change patient's chemotherapy to something that would be better tolerated. Will place Taxotere on pt's allergy list.   Also, patient was advised to go directly to the emergency department for any worsening symptoms whatsoever.   Patient stated understanding of all instructions; and was in  agreement with this plan of care. The patient knows to call the clinic with any problems, questions or concerns.   Total time spent with  patient was 40 minutes;  with greater than 75 percent of that time spent in face to face counseling regarding patient's symptoms,  and coordination of care and follow up.  Disclaimer:This dictation was prepared with Dragon/digital dictation along with [employer]. Any transcriptional errors that result from this process are unintentional.  Drue Second, NP 05/15/2015

## 2015-05-15 NOTE — Assessment & Plan Note (Signed)
Patient received cycle 2 of her Taxotere/Cytoxan chemotherapy on 05/13/2015.  The patient developed a hypersensitivity reaction to her chemotherapy with a diffuse rash.  See further notes for details.  Patient is scheduled to return for labs and a follow-up visit on 05/21/2015.

## 2015-05-15 NOTE — Assessment & Plan Note (Addendum)
Patient received cycle 2 of her Taxotere/Cytoxan chemotherapy regimen on 05/13/2015.  Patient states that she had redness to entire face by the time she had driven home.  By the next morning-she had a full body rash/erythema; and some mild pruritus.  She denies any issues managing her secretions or her airway.  She denies any other new symptoms.  Patient states that she took Benadryl 50 mg prior to coming to the [facility] today.  Exam today reveals significant, diarrhea, diffuse rash from patient's face down to her knees.  Increased erythema to the chest area.  Patient is scratching occasionally.  Airway appears intact and patient is managing all oral secretions.  Vital signs were stable and patient was afebrile.  Patient was given Solu-Medrol 80 mg IM; and Pepcid 20 mg orally.  Patient was monitored closely for proximally one hour; and patient's rash/erythema was slowly improving.  Reviewed all findings with Dr. Francis Dowse he suggested that patient increase her dexamethasone to 8 mg 3 times per day for the next 2-3 days.  Dr. Jana Hakim advised he would change patient's chemotherapy to something that would be better tolerated. Will place Taxotere on pt's allergy list.   Also, patient was advised to go directly to the emergency department for any worsening symptoms whatsoever.

## 2015-05-21 ENCOUNTER — Encounter: Payer: Self-pay | Admitting: Nurse Practitioner

## 2015-05-21 ENCOUNTER — Other Ambulatory Visit (HOSPITAL_BASED_OUTPATIENT_CLINIC_OR_DEPARTMENT_OTHER): Payer: BC Managed Care – PPO

## 2015-05-21 ENCOUNTER — Telehealth: Payer: Self-pay | Admitting: Nurse Practitioner

## 2015-05-21 ENCOUNTER — Ambulatory Visit (HOSPITAL_BASED_OUTPATIENT_CLINIC_OR_DEPARTMENT_OTHER): Payer: BC Managed Care – PPO | Admitting: Nurse Practitioner

## 2015-05-21 ENCOUNTER — Other Ambulatory Visit: Payer: Self-pay | Admitting: Oncology

## 2015-05-21 VITALS — BP 100/34 | HR 83 | Temp 98.0°F | Resp 18 | Ht 67.0 in | Wt 170.2 lb

## 2015-05-21 DIAGNOSIS — L658 Other specified nonscarring hair loss: Secondary | ICD-10-CM

## 2015-05-21 DIAGNOSIS — Z8543 Personal history of malignant neoplasm of ovary: Secondary | ICD-10-CM | POA: Diagnosis not present

## 2015-05-21 DIAGNOSIS — D72829 Elevated white blood cell count, unspecified: Secondary | ICD-10-CM

## 2015-05-21 DIAGNOSIS — C50412 Malignant neoplasm of upper-outer quadrant of left female breast: Secondary | ICD-10-CM

## 2015-05-21 DIAGNOSIS — T451X5A Adverse effect of antineoplastic and immunosuppressive drugs, initial encounter: Principal | ICD-10-CM

## 2015-05-21 LAB — CBC WITH DIFFERENTIAL/PLATELET
BASO%: 0.4 % (ref 0.0–2.0)
Basophils Absolute: 0.1 10*3/uL (ref 0.0–0.1)
EOS%: 0.9 % (ref 0.0–7.0)
Eosinophils Absolute: 0.2 10*3/uL (ref 0.0–0.5)
HCT: 35.8 % (ref 34.8–46.6)
HGB: 11.5 g/dL — ABNORMAL LOW (ref 11.6–15.9)
LYMPH%: 6.8 % — ABNORMAL LOW (ref 14.0–49.7)
MCH: 28.9 pg (ref 25.1–34.0)
MCHC: 32 g/dL (ref 31.5–36.0)
MCV: 90.1 fL (ref 79.5–101.0)
MONO#: 2.7 10*3/uL — ABNORMAL HIGH (ref 0.1–0.9)
MONO%: 11.6 % (ref 0.0–14.0)
NEUT#: 18.4 10*3/uL — ABNORMAL HIGH (ref 1.5–6.5)
NEUT%: 80.3 % — ABNORMAL HIGH (ref 38.4–76.8)
Platelets: 279 10*3/uL (ref 145–400)
RBC: 3.97 10*6/uL (ref 3.70–5.45)
RDW: 14.1 % (ref 11.2–14.5)
WBC: 22.9 10*3/uL — ABNORMAL HIGH (ref 3.9–10.3)
lymph#: 1.6 10*3/uL (ref 0.9–3.3)

## 2015-05-21 LAB — COMPREHENSIVE METABOLIC PANEL
ALT: 19 U/L (ref 0–55)
AST: 18 U/L (ref 5–34)
Albumin: 3.3 g/dL — ABNORMAL LOW (ref 3.5–5.0)
Alkaline Phosphatase: 84 U/L (ref 40–150)
Anion Gap: 8 mEq/L (ref 3–11)
BUN: 15.9 mg/dL (ref 7.0–26.0)
CO2: 25 mEq/L (ref 22–29)
Calcium: 9.4 mg/dL (ref 8.4–10.4)
Chloride: 106 mEq/L (ref 98–109)
Creatinine: 0.9 mg/dL (ref 0.6–1.1)
EGFR: 71 mL/min/{1.73_m2} — ABNORMAL LOW (ref >90)
Glucose: 102 mg/dl (ref 70–140)
Potassium: 4.2 mEq/L (ref 3.5–5.1)
Sodium: 139 mEq/L (ref 136–145)
Total Bilirubin: <0.3 mg/dL (ref 0.20–1.20)
Total Protein: 6.1 g/dL — ABNORMAL LOW (ref 6.4–8.3)

## 2015-05-21 NOTE — Progress Notes (Signed)
[facility]  Telephone:([phone]) [phone] [phone]   ID: Vicki Hale DOB: [birth year]  MR#: [id number]  UDJ#:[id number]  Patient Care Team: Leonides Sake, MD as PCP - General (Family Medicine) Chauncey Cruel, MD as Consulting Physician (Oncology) Rolm Bookbinder, MD as Consulting Physician (General Surgery) Consuella Lose, MD as Consulting Physician (Neurosurgery) Christene Slates, MD as Physician Assistant (Radiology) Rosalin Hawking, MD as Consulting Physician (Neurology) PCP: Leonides Sake, MD GYN: OTHER MD:  CHIEF COMPLAINT: Breast cancer in likely BRCA carrier  CURRENT TREATMENT: Adjuvant chemotherapy  CANCER HISTORY: From the original intake note:  Vicki Hale was diagnosed with ovarian cancer in April 2003. She underwent total abdominal hysterectomy and bilateral salpingo-oophorectomy with a full staging operation at [facility] that month and was then treated according to GOG 182, with 4 cycles of carboplatin/topotecan and then 4 cycles of carboplatin/paclitaxel. She was then followed by gynecologic oncology through 2011, and there has been no history of recurrent disease.  On 58/85/0277 Vicki Hale underwent screening mammography at [facility] with tomosynthesis. Breast density was category A. New grouped calcifications were noted in the left breast upper outer quadrant and the patient was recalled for diagnostic unilateral left mammography 01/26/2015. This confirmed a group of new linear calcifications in the upper outer quadrant of the left breast extending over an area of 2.0 cm..   On 01/28/2015 the patient underwent biopsy of her left breast, with the pathology ([id number]) showing an invasive ductal carcinoma, grade 2, estrogen receptor 100% positive, progesterone receptor 50% positive, both with strong staining intensity, with an MIB-1 of 10% and no HER-2 amplification, the signals ratio being 1.21 and the number per cell 2.00.  Her subsequent history is as detailed  below  INTERVAL HISTORY: Vicki Hale returns today for follow-up of her estrogen receptor positive breast. Today is day 8, cycle 2 of 4 planned cycles of cyclophosphamide and docetaxel given 21 days apart with onpro support. She had a hypersensitivity reaction with cycle 2, resulting into nearly a full body rash. She was treated with solumedrol IM and pepcid.   REVIEW OF SYSTEMS: Vicki Hale's rash is now gone. Her diarrhea has resolved. She offers no new complaints today. She has no fevers, chills, nausea, or vomiting. She denies mouth sores. She has not experienced any neuropathy symptoms. A detailed review of systems is otherwise stable.   Past Medical History  Diagnosis Date  . Ovarian cancer ([place])   . Stroke ([place])   . Breast cancer ([place]) 2017    [facility] in [place]  . Family history of breast cancer   . FH: BRCA1 gene positive   . Family history of pancreatic cancer   . SAH (subarachnoid hemorrhage) ([place])     04/2014  . Pericarditis, acute     30 YRS AGO   . Arthritis     PAST SURGICAL HISTORY: Past Surgical History  Procedure Laterality Date  . Tonsillectomy    . Hernia repair    . Total abdominal hysterectomy w/ bilateral salpingoophorectomy    . Cesarean section      X2  . Mastectomy w/ sentinel node biopsy Left 03/18/2015    Procedure: LEFT TOTAL MASTECTOMY WITH LEFT AXILLARY SENTINEL LYMPH NODE BIOPSY;  Surgeon: Excell Seltzer, MD;  Location: [facility];  Service: General;  Laterality: Left;  Marland Kitchen Mastectomy, partial Right 03/18/2015    Procedure: RIGHT PROPHYLACTIC MASTECTOMY ;  Surgeon: Excell Seltzer, MD;  Location: [facility];  Service: General;  Laterality: Right;  . Breast reconstruction with placement of tissue  expander and flex hd (acellular hydrated dermis) Bilateral 03/18/2015    Procedure: BILATERAL BREAST RECONSTRUCTION WITH PLACEMENT OF TISSUE EXPANDER AND FLEX HD (ACELLULAR HYDRATED DERMIS);  Surgeon: Loel Lofty Dillingham, DO;  Location: [facility];  Service: Plastics;  Laterality: Bilateral;      FAMILY HISTORY Family History  Problem Relation Age of Onset  . Colon polyps Mother   . Colon cancer Neg Hx   . Rectal cancer Neg Hx   . Ulcerative colitis Neg Hx   . Stomach cancer Neg Hx   . Breast cancer Daughter 60  . BRCA 1/2 Daughter     BRCA1  . COPD Maternal Aunt   . Esophageal cancer Maternal Uncle   . Lung cancer Paternal Aunt   . Pancreatic cancer Maternal Grandmother     early 75s  . Prostate cancer Maternal Grandfather     5s  . Skin cancer Paternal Grandmother     untreated BCC  . COPD Maternal Aunt   . Lung cancer Maternal Aunt   . Breast cancer Cousin 57    maternal first cousin  . BRCA 1/2 Daughter     BRCA1  . Esophageal cancer Cousin     maternal first cousin  . Bladder Cancer Paternal Aunt    the patient's 2 daughters have been tested for the BRCA gene and both are positive. One of them had breast cancer at the age of 67. In addition on the maternal side there is pancreatic prostate breast and esophageal cancer. The patient's father died at the age of 48 and a car accident and there is very little information on his side of the family. The patient's mother died at 75 from COPD. She had had a hysterectomy in her 67s. The patient's mother had 5 siblings several from died young. The patient herself had no sisters, 2 brothers.  GYNECOLOGIC HISTORY:  No LMP recorded. Patient has had a hysterectomy.  Menarche age 55, first live birth age 76, the patient is Vicki Hale. She underwent TAH/BSO in 2003 for her ovarian cancer. She took hormone replacement for approximately 6 months. She was on oral contraceptives for approximately 4 years remotely, with no complications.  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 Her husband Vicki Hale works for a [employer]. Daughter Vicki Hale lives in [place] where she works as a school principal. She is 91 years old. Daughter Vicki Hale lives in [place]  and is a Pharmacist, hospital. She is 64 years old. These ages are as of January 2017. The patient has 4 grandchildren. She attends a [employer]    ADVANCED DIRECTIVES: Not in place   HEALTH MAINTENANCE: Social History  Substance Use Topics  . Smoking status: Never Smoker   . Smokeless tobacco: Never Used  . Alcohol Use: 0.0 oz/week    0 Standard drinks or equivalent per week     Comment: ocassional; once monthly     Colonoscopy: September 2016/ [facility]  PAP:  Bone density: 2016  Lipid panel:  Allergies  Allergen Reactions  . Taxotere [Docetaxel] Rash  . Penicillins Itching and Rash    Has patient had a PCN reaction causing immediate rash, facial/tongue/throat swelling, SOB or lightheadedness with hypotension: No Has patient had a PCN reaction causing severe rash involving mucus membranes or skin necrosis: No Has patient had a PCN reaction that required hospitalization No Has patient had a PCN reaction occurring within the  last 10 years: No If all of the above answers are "NO", then may proceed with Cephalosporin use.     Current Outpatient Prescriptions  Medication Sig Dispense Refill  . Ascorbic Acid (VITAMIN C) 1000 MG tablet Take 1,000 mg by mouth 2 (two) times daily.    . B Complex Vitamins (B COMPLEX PO) Take 2 tablets by mouth daily.    Marland Kitchen dexamethasone (DECADRON) 4 MG tablet Take 2 tablets (8 mg total) by mouth 2 (two) times daily. Start the day before Taxotere. Then again the day after chemo for 3 days. 30 tablet 3  . ibuprofen (ADVIL,MOTRIN) 200 MG tablet Take 400-800 mg by mouth 2 (two) times daily as needed. Reported on 05/13/2015    . LORazepam (ATIVAN) 0.5 MG tablet Take daily at bedtime as needed for insomnia 30 tablet 0  . Multiple Vitamin (MULTIVITAMIN WITH MINERALS) TABS tablet Take 2 tablets by mouth daily.    . valACYclovir (VALTREX) 500 MG tablet Take 1 tablet (500 mg total) by mouth daily. 30 tablet 1  . ondansetron (ZOFRAN) 8 MG tablet Take 1 tablet (8 mg  total) by mouth 2 (two) times daily as needed for refractory nausea / vomiting. Start on day 3 after chemo. (Patient not taking: Reported on 05/21/2015) 30 tablet 1  . prochlorperazine (COMPAZINE) 10 MG tablet Take 1 tablet (10 mg total) by mouth every 6 (six) hours as needed (Nausea or vomiting). (Patient not taking: Reported on 05/21/2015) 30 tablet 1   No current facility-administered medications for this visit.    OBJECTIVE: Middle-aged white woman who  Filed Vitals:   05/21/15 0941  BP: 100/34  Pulse: 83  Temp: 98 F (36.7 C)  Resp: 18     Body mass index is 26.65 kg/(m^2).    ECOG FS:1 - Symptomatic but completely ambulatory  Sclerae unicteric, pupils round and equal Oropharynx clear and moist-- no thrush or other lesions No cervical or supraclavicular adenopathy Lungs no rales or rhonchi Heart regular rate and rhythm Abd soft, nontender, positive bowel sounds MSK no focal spinal tenderness, no upper extremity lymphedema Neuro: nonfocal, well oriented, appropriate affect Breasts: deferred  LAB RESULTS:  CMP     Component Value Date/Time   NA 139 05/21/2015 0920   NA 138 03/18/2015 1653   NA 140 08/15/2014 1104   K 4.2 05/21/2015 0920   K 4.3 03/18/2015 1653   CL 104 03/18/2015 1653   CO2 25 05/21/2015 0920   CO2 25 03/18/2015 1653   GLUCOSE 102 05/21/2015 0920   GLUCOSE 170* 03/18/2015 1653   GLUCOSE 87 08/15/2014 1104   BUN 15.9 05/21/2015 0920   BUN 9 03/18/2015 1653   BUN 18 08/15/2014 1104   CREATININE 0.9 05/21/2015 0920   CREATININE 0.83 03/18/2015 1653   CALCIUM 9.4 05/21/2015 0920   CALCIUM 8.9 03/18/2015 1653   PROT 6.1* 05/21/2015 0920   ALBUMIN 3.3* 05/21/2015 0920   AST 18 05/21/2015 0920   ALT 19 05/21/2015 0920   ALKPHOS 84 05/21/2015 0920   BILITOT <0.30 05/21/2015 0920   GFRNONAA >60 03/18/2015 1653   GFRAA >60 03/18/2015 1653    INo results found for: SPEP, UPEP  Lab Results  Component Value Date   WBC 22.9* 05/21/2015   NEUTROABS  18.4* 05/21/2015   HGB 11.5* 05/21/2015   HCT 35.8 05/21/2015   MCV 90.1 05/21/2015   PLT 279 05/21/2015      Chemistry      Component Value Date/Time   NA 139  05/21/2015 0920   NA 138 03/18/2015 1653   NA 140 08/15/2014 1104   K 4.2 05/21/2015 0920   K 4.3 03/18/2015 1653   CL 104 03/18/2015 1653   CO2 25 05/21/2015 0920   CO2 25 03/18/2015 1653   BUN 15.9 05/21/2015 0920   BUN 9 03/18/2015 1653   BUN 18 08/15/2014 1104   CREATININE 0.9 05/21/2015 0920   CREATININE 0.83 03/18/2015 1653      Component Value Date/Time   CALCIUM 9.4 05/21/2015 0920   CALCIUM 8.9 03/18/2015 1653   ALKPHOS 84 05/21/2015 0920   AST 18 05/21/2015 0920   ALT 19 05/21/2015 0920   BILITOT <0.30 05/21/2015 0920      No results found for: LABCA2  No components found for: LABCA125  No results for input(s): INR in the last 168 hours.  Urinalysis No results found for: COLORURINE, APPEARANCEUR, LABSPEC, PHURINE, GLUCOSEU, HGBUR, BILIRUBINUR, KETONESUR, PROTEINUR, UROBILINOGEN, NITRITE, LEUKOCYTESUR  STUDIES: No results found.  ASSESSMENT: 65 y.o. BRCA1 positive [place], [place] woman  (1) ovarian cancer stage IIIB diagnosed April 2003  (a) s/p supracervical hysterectomy with surgical staging  (b) treated according to GOG 182: carboplatin/ topotecan x4 followed by carboplatin/taxol x4  (c) last chemotherapy dose October 2003  (2) status post left breast needle core biopsy 01/28/2015 for an invasive ductal carcinoma, grade 2, estrogen and progesterone receptor positive, HER-2 not amplified, with an MIB-1 of 10%  (a) tamoxifen started neoadjuvantly due to concerns regarding surgical delay, interrupted with chemotherapy  (3) status post bilateral mastectomies with left axillary lymph node sampling 03/18/2015 showing  (a) on the right, benign disease  (b) on the left, a pT1c pN0, stage IA invasive ductal carcinoma, repeat HER-2 again negative, stage IA    (4) Oncotype DX score of 29, in the high  intermediate range, predicts a risk of recurrence with tamoxifen alone of 19%. The addition of chemotherapy in this setting is predicted to decrease distant disease-free recurrence by approximately 6%.   (5) adjuvant chemotherapy with cyclophosphamide and docetaxel started 04/22/2015, to be repeated every 21 days 4  (a) gemcitabine switched for docetaxel for cycles 3 and 4 due to rash.  (6) adjuvant anti-estrogens to follow local treatment  PLAN: Vicki Hale is nearly back to her baseline today. Her labs are stable with some leukocytosis from neulasta. I discussed her case with Dr. Jana Hakim. He is in favor of substituting gemcitabine for docetaxel with her final cycles of treatment. Those orders have been placed today.  I gave her a prescription for a potential discount on her wig.   Vicki Hale will return in 2 weeks for cycle 3 of treatment. She understands and agrees with this plan. She knows the goal of treatment in her case is cure. She has been encouraged to call with any issues that might arise before her next visit here.   Laurie Panda, NP   05/21/2015 10:52 AM

## 2015-05-21 NOTE — Telephone Encounter (Signed)
appt made and avs printed °

## 2015-05-28 ENCOUNTER — Other Ambulatory Visit: Payer: Self-pay | Admitting: Oncology

## 2015-06-03 ENCOUNTER — Ambulatory Visit (HOSPITAL_BASED_OUTPATIENT_CLINIC_OR_DEPARTMENT_OTHER): Payer: BC Managed Care – PPO | Admitting: Nurse Practitioner

## 2015-06-03 ENCOUNTER — Ambulatory Visit (HOSPITAL_BASED_OUTPATIENT_CLINIC_OR_DEPARTMENT_OTHER): Payer: BC Managed Care – PPO

## 2015-06-03 ENCOUNTER — Other Ambulatory Visit (HOSPITAL_BASED_OUTPATIENT_CLINIC_OR_DEPARTMENT_OTHER): Payer: BC Managed Care – PPO

## 2015-06-03 ENCOUNTER — Encounter: Payer: Self-pay | Admitting: Nurse Practitioner

## 2015-06-03 VITALS — BP 126/57 | HR 82 | Temp 98.4°F | Resp 18 | Ht 67.0 in | Wt 169.4 lb

## 2015-06-03 DIAGNOSIS — C50412 Malignant neoplasm of upper-outer quadrant of left female breast: Secondary | ICD-10-CM

## 2015-06-03 DIAGNOSIS — Z8543 Personal history of malignant neoplasm of ovary: Secondary | ICD-10-CM

## 2015-06-03 DIAGNOSIS — Z5111 Encounter for antineoplastic chemotherapy: Secondary | ICD-10-CM

## 2015-06-03 LAB — COMPREHENSIVE METABOLIC PANEL
ALT: 17 U/L (ref 0–55)
AST: 17 U/L (ref 5–34)
Albumin: 3.4 g/dL — ABNORMAL LOW (ref 3.5–5.0)
Alkaline Phosphatase: 78 U/L (ref 40–150)
Anion Gap: 6 mEq/L (ref 3–11)
BUN: 12.3 mg/dL (ref 7.0–26.0)
CO2: 26 mEq/L (ref 22–29)
Calcium: 9.1 mg/dL (ref 8.4–10.4)
Chloride: 107 mEq/L (ref 98–109)
Creatinine: 0.8 mg/dL (ref 0.6–1.1)
EGFR: 74 mL/min/{1.73_m2} — ABNORMAL LOW (ref >90)
Glucose: 98 mg/dl (ref 70–140)
Potassium: 3.6 mEq/L (ref 3.5–5.1)
Sodium: 140 mEq/L (ref 136–145)
Total Bilirubin: 0.49 mg/dL (ref 0.20–1.20)
Total Protein: 6.8 g/dL (ref 6.4–8.3)

## 2015-06-03 LAB — CBC WITH DIFFERENTIAL/PLATELET
BASO%: 1.4 % (ref 0.0–2.0)
Basophils Absolute: 0.1 10*3/uL (ref 0.0–0.1)
EOS%: 0.7 % (ref 0.0–7.0)
Eosinophils Absolute: 0 10*3/uL (ref 0.0–0.5)
HCT: 35.9 % (ref 34.8–46.6)
HGB: 11.8 g/dL (ref 11.6–15.9)
LYMPH%: 12.5 % — ABNORMAL LOW (ref 14.0–49.7)
MCH: 29.5 pg (ref 25.1–34.0)
MCHC: 33 g/dL (ref 31.5–36.0)
MCV: 89.3 fL (ref 79.5–101.0)
MONO#: 0.7 10*3/uL (ref 0.1–0.9)
MONO%: 9.5 % (ref 0.0–14.0)
NEUT#: 5.8 10*3/uL (ref 1.5–6.5)
NEUT%: 75.9 % (ref 38.4–76.8)
Platelets: 387 10*3/uL (ref 145–400)
RBC: 4.02 10*6/uL (ref 3.70–5.45)
RDW: 15 % — ABNORMAL HIGH (ref 11.2–14.5)
WBC: 7.6 10*3/uL (ref 3.9–10.3)
lymph#: 0.9 10*3/uL (ref 0.9–3.3)

## 2015-06-03 MED ORDER — SODIUM CHLORIDE 0.9 % IV SOLN
800.0000 mg/m2 | Freq: Once | INTRAVENOUS | Status: AC
  Administered 2015-06-03: 1520 mg via INTRAVENOUS
  Filled 2015-06-03: qty 39.98

## 2015-06-03 MED ORDER — SODIUM CHLORIDE 0.9 % IV SOLN
Freq: Once | INTRAVENOUS | Status: AC
  Administered 2015-06-03: 10:00:00 via INTRAVENOUS

## 2015-06-03 MED ORDER — CYCLOPHOSPHAMIDE CHEMO INJECTION 1 GM
600.0000 mg/m2 | Freq: Once | INTRAMUSCULAR | Status: AC
  Administered 2015-06-03: 1160 mg via INTRAVENOUS
  Filled 2015-06-03: qty 58

## 2015-06-03 MED ORDER — PALONOSETRON HCL INJECTION 0.25 MG/5ML
INTRAVENOUS | Status: AC
  Filled 2015-06-03: qty 5

## 2015-06-03 MED ORDER — SODIUM CHLORIDE 0.9 % IV SOLN
10.0000 mg | Freq: Once | INTRAVENOUS | Status: AC
  Administered 2015-06-03: 10 mg via INTRAVENOUS
  Filled 2015-06-03: qty 1

## 2015-06-03 MED ORDER — PEGFILGRASTIM 6 MG/0.6ML ~~LOC~~ PSKT
6.0000 mg | PREFILLED_SYRINGE | Freq: Once | SUBCUTANEOUS | Status: AC
  Administered 2015-06-03: 6 mg via SUBCUTANEOUS
  Filled 2015-06-03: qty 0.6

## 2015-06-03 MED ORDER — PALONOSETRON HCL INJECTION 0.25 MG/5ML
0.2500 mg | Freq: Once | INTRAVENOUS | Status: AC
  Administered 2015-06-03: 0.25 mg via INTRAVENOUS

## 2015-06-03 NOTE — Progress Notes (Signed)
Per Nira Conn NP - pt was instructed to take Decadron BID tomorrow, the next day and on day 3 take one dose in am and that is all - pt agreed.

## 2015-06-03 NOTE — Patient Instructions (Addendum)
TAKE DEXAMETHASONE TWICE A DAY TOMORROW, TWICE A DAY ON Friday AND ONE DOSE ON Saturday MORNING PER HEATHER NP.   [facility] Discharge Instructions for Patients Receiving Chemotherapy  Today you received the following chemotherapy agents Gemzar and Cytoxan. To help prevent nausea and vomiting after your treatment, we encourage you to take your nausea medication as directed.  NO ZOFRAN FOR 3 DAYS If you develop nausea and vomiting that is not controlled by your nausea medication, call the clinic.   BELOW ARE SYMPTOMS THAT SHOULD BE REPORTED IMMEDIATELY:  *FEVER GREATER THAN 100.5 F  *CHILLS WITH OR WITHOUT FEVER  NAUSEA AND VOMITING THAT IS NOT CONTROLLED WITH YOUR NAUSEA MEDICATION  *UNUSUAL SHORTNESS OF BREATH  *UNUSUAL BRUISING OR BLEEDING  TENDERNESS IN MOUTH AND THROAT WITH OR WITHOUT PRESENCE OF ULCERS  *URINARY PROBLEMS  *BOWEL PROBLEMS  UNUSUAL RASH Items with * indicate a potential emergency and should be followed up as soon as possible.  Feel free to call the clinic you have any questions or concerns. The clinic phone number is [phone].  Please show the [facility] at check-in to the Emergency Department and triage nurse.   Gemcitabine injection What is this medicine? GEMCITABINE (jem SIT a been) is a chemotherapy drug. This medicine is used to treat many types of cancer like breast cancer, lung cancer, pancreatic cancer, and ovarian cancer. This medicine may be used for other purposes; ask your health care provider or pharmacist if you have questions. What should I tell my health care provider before I take this medicine? They need to know if you have any of these conditions: -blood disorders -infection -kidney disease -liver disease -recent or ongoing radiation therapy -an unusual or allergic reaction to gemcitabine, other chemotherapy, other medicines, foods, dyes, or preservatives -pregnant or trying to get  pregnant -breast-feeding How should I use this medicine? This drug is given as an infusion into a vein. It is administered in a hospital or clinic by a specially trained health care professional. Talk to your pediatrician regarding the use of this medicine in children. Special care may be needed. Overdosage: If you think you have taken too much of this medicine contact a poison control center or emergency room at once. NOTE: This medicine is only for you. Do not share this medicine with others. What if I miss a dose? It is important not to miss your dose. Call your doctor or health care professional if you are unable to keep an appointment. What may interact with this medicine? -medicines to increase blood counts like filgrastim, pegfilgrastim, sargramostim -some other chemotherapy drugs like cisplatin -vaccines Talk to your doctor or health care professional before taking any of these medicines: -acetaminophen -aspirin -ibuprofen -ketoprofen -naproxen This list may not describe all possible interactions. Give your health care provider a list of all the medicines, herbs, non-prescription drugs, or dietary supplements you use. Also tell them if you smoke, drink alcohol, or use illegal drugs. Some items may interact with your medicine. What should I watch for while using this medicine? Visit your doctor for checks on your progress. This drug may make you feel generally unwell. This is not uncommon, as chemotherapy can affect healthy cells as well as cancer cells. Report any side effects. Continue your course of treatment even though you feel ill unless your doctor tells you to stop. In some cases, you may be given additional medicines to help with side effects. Follow all directions for their use. Call  your doctor or health care professional for advice if you get a fever, chills or sore throat, or other symptoms of a cold or flu. Do not treat yourself. This drug decreases your body's ability to  fight infections. Try to avoid being around people who are sick. This medicine may increase your risk to bruise or bleed. Call your doctor or health care professional if you notice any unusual bleeding. Be careful brushing and flossing your teeth or using a toothpick because you may get an infection or bleed more easily. If you have any dental work done, tell your dentist you are receiving this medicine. Avoid taking products that contain aspirin, acetaminophen, ibuprofen, naproxen, or ketoprofen unless instructed by your doctor. These medicines may hide a fever. Women should inform their doctor if they wish to become pregnant or think they might be pregnant. There is a potential for serious side effects to an unborn child. Talk to your health care professional or pharmacist for more information. Do not breast-feed an infant while taking this medicine. What side effects may I notice from receiving this medicine? Side effects that you should report to your doctor or health care professional as soon as possible: -allergic reactions like skin rash, itching or hives, swelling of the face, lips, or tongue -low blood counts - this medicine may decrease the number of white blood cells, red blood cells and platelets. You may be at increased risk for infections and bleeding. -signs of infection - fever or chills, cough, sore throat, pain or difficulty passing urine -signs of decreased platelets or bleeding - bruising, pinpoint red spots on the skin, black, tarry stools, blood in the urine -signs of decreased red blood cells - unusually weak or tired, fainting spells, lightheadedness -breathing problems -chest pain -mouth sores -nausea and vomiting -pain, swelling, redness at site where injected -pain, tingling, numbness in the hands or feet -stomach pain -swelling of ankles, feet, hands -unusual bleeding Side effects that usually do not require medical attention (report to your doctor or health care  professional if they continue or are bothersome): -constipation -diarrhea -hair loss -loss of appetite -stomach upset This list may not describe all possible side effects. Call your doctor for medical advice about side effects. You may report side effects to FDA at 1-800-FDA-1088. Where should I keep my medicine? This drug is given in a hospital or clinic and will not be stored at home. NOTE: This sheet is a summary. It may not cover all possible information. If you have questions about this medicine, talk to your doctor, pharmacist, or health care provider.    2016, Elsevier/Gold Standard. (2007-05-15 18:45:54)

## 2015-06-03 NOTE — Progress Notes (Signed)
[place]  [phone] [phone]   ID: Vicki Hale DOB: [birth year]  MR#: [id number]  OMB#:[id number]  Patient Care Team: Leonides Sake, MD as PCP - General (Family Medicine) Chauncey Cruel, MD as Consulting Physician (Oncology) Rolm Bookbinder, MD as Consulting Physician (General Surgery) Consuella Lose, MD as Consulting Physician (Neurosurgery) Christene Slates, MD as Physician Assistant (Radiology) Rosalin Hawking, MD as Consulting Physician (Neurology) PCP: Leonides Sake, MD GYN: OTHER MD:  CHIEF COMPLAINT: Breast cancer in likely BRCA carrier  CURRENT TREATMENT: Adjuvant chemotherapy  CANCER HISTORY: From the original intake note:  Vicki Hale was diagnosed with ovarian cancer in April 2003. She underwent total abdominal hysterectomy and bilateral salpingo-oophorectomy with a full staging operation at [facility] that month and was then treated according to GOG 182, with 4 cycles of carboplatin/topotecan and then 4 cycles of carboplatin/paclitaxel. She was then followed by gynecologic oncology through 2011, and there has been no history of recurrent disease.  On 45/36/4680 Vicki Hale underwent screening mammography at [facility] with tomosynthesis. Breast density was category A. New grouped calcifications were noted in the left breast upper outer quadrant and the patient was recalled for diagnostic unilateral left mammography 01/26/2015. This confirmed a group of new linear calcifications in the upper outer quadrant of the left breast extending over an area of 2.0 cm..   On 01/28/2015 the patient underwent biopsy of her left breast, with the pathology ([id number]) showing an invasive ductal carcinoma, grade 2, estrogen receptor 100% positive, progesterone receptor 50% positive, both with strong staining intensity, with an MIB-1 of 10% and no HER-2 amplification, the signals ratio being 1.21 and the number per cell 2.00.  Her subsequent history is as detailed  below  INTERVAL HISTORY: Vicki Hale returns today for follow-up of her estrogen receptor positive breast. Today is day 1, cycle 3 of 4 planned cycles of cyclophosphamide and gemcitabine given 21 days apart with onpro support. She had a hypersensitivity reaction with cycle 2, resulting into nearly a full body rash. So the docetaxel has been switched out for gemcitabine. The rash is clearing but there are still 2 areas to her chest that she is treating with creams.  REVIEW OF SYSTEMS: Vicki Hale offers no new complaints today. She has no fevers, chills, nausea, or vomiting. Her diarrhea has resolved She denies mouth sores. She has not experienced any neuropathy symptoms. She is taking claritin daily for allergy symptoms. She is sleeping better with ativan QHS. A detailed review of systems is otherwise stable.   Past Medical History  Diagnosis Date  . Ovarian cancer ([place])   . Stroke ([place])   . Breast cancer ([place]) 2017    [facility] in [place]  . Family history of breast cancer   . FH: BRCA1 gene positive   . Family history of pancreatic cancer   . SAH (subarachnoid hemorrhage) ([place])     04/2014  . Pericarditis, acute     30 YRS AGO   . Arthritis     PAST SURGICAL HISTORY: Past Surgical History  Procedure Laterality Date  . Tonsillectomy    . Hernia repair    . Total abdominal hysterectomy w/ bilateral salpingoophorectomy    . Cesarean section      X2  . Mastectomy w/ sentinel node biopsy Left 03/18/2015    Procedure: LEFT TOTAL MASTECTOMY WITH LEFT AXILLARY SENTINEL LYMPH NODE BIOPSY;  Surgeon: Excell Seltzer, MD;  Location: [place];  Service: General;  Laterality: Left;  Marland Kitchen Mastectomy, partial Right 03/18/2015  Procedure: RIGHT PROPHYLACTIC MASTECTOMY ;  Surgeon: Excell Seltzer, MD;  Location: [place];  Service: General;  Laterality: Right;  . Breast reconstruction with placement of tissue expander and flex hd (acellular hydrated dermis) Bilateral 03/18/2015    Procedure: BILATERAL BREAST RECONSTRUCTION  WITH PLACEMENT OF TISSUE EXPANDER AND FLEX HD (ACELLULAR HYDRATED DERMIS);  Surgeon: Loel Lofty Dillingham, DO;  Location: [place];  Service: Plastics;  Laterality: Bilateral;    FAMILY HISTORY Family History  Problem Relation Age of Onset  . Colon polyps Mother   . Colon cancer Neg Hx   . Rectal cancer Neg Hx   . Ulcerative colitis Neg Hx   . Stomach cancer Neg Hx   . Breast cancer Daughter 23  . BRCA 1/2 Daughter     BRCA1  . COPD Maternal Aunt   . Esophageal cancer Maternal Uncle   . Lung cancer Paternal Aunt   . Pancreatic cancer Maternal Grandmother     early 33s  . Prostate cancer Maternal Grandfather     68s  . Skin cancer Paternal Grandmother     untreated BCC  . COPD Maternal Aunt   . Lung cancer Maternal Aunt   . Breast cancer Cousin 63    maternal first cousin  . BRCA 1/2 Daughter     BRCA1  . Esophageal cancer Cousin     maternal first cousin  . Bladder Cancer Paternal Aunt    the patient's 2 daughters have been tested for the BRCA gene and both are positive. One of them had breast cancer at the age of 64. In addition on the maternal side there is pancreatic prostate breast and esophageal cancer. The patient's father died at the age of 15 and a car accident and there is very little information on his side of the family. The patient's mother died at 45 from COPD. She had had a hysterectomy in her 13s. The patient's mother had 5 siblings several from died young. The patient herself had no sisters, 2 brothers.  GYNECOLOGIC HISTORY:  No LMP recorded. Patient has had a hysterectomy.  Menarche age 69, first live birth age 35, the patient is Vicki Hale. She underwent TAH/BSO in 2003 for her ovarian cancer. She took hormone replacement for approximately 6 months. She was on oral contraceptives for approximately 4 years remotely, with no complications.  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 Her husband  Vicki Hale works for a [employer]. Daughter Vicki Hale lives in [place] where she works as a school principal. She is 48 years old. Daughter Vicki Hale lives in [place] and is a Pharmacist, hospital. She is 64 years old. These ages are as of January 2017. The patient has 4 grandchildren. She attends a [employer]    ADVANCED DIRECTIVES: Not in place   HEALTH MAINTENANCE: Social History  Substance Use Topics  . Smoking status: Never Smoker   . Smokeless tobacco: Never Used  . Alcohol Use: 0.0 oz/week    0 Standard drinks or equivalent per week     Comment: ocassional; once monthly     Colonoscopy: September 2016/ [facility]  PAP:  Bone density: 2016  Lipid panel:  Allergies  Allergen Reactions  . Taxotere [Docetaxel] Rash  . Penicillins Itching and Rash    Has patient had a PCN reaction causing immediate rash, facial/tongue/throat swelling, SOB or lightheadedness with hypotension: No Has patient had a PCN reaction causing severe  rash involving mucus membranes or skin necrosis: No Has patient had a PCN reaction that required hospitalization No Has patient had a PCN reaction occurring within the last 10 years: No If all of the above answers are "NO", then may proceed with Cephalosporin use.     Current Outpatient Prescriptions  Medication Sig Dispense Refill  . Ascorbic Acid (VITAMIN C) 1000 MG tablet Take 1,000 mg by mouth 2 (two) times daily.    . B Complex Vitamins (B COMPLEX PO) Take 2 tablets by mouth daily.    Marland Kitchen LORazepam (ATIVAN) 0.5 MG tablet TAKE 1 TABLET BY MOUTH AT BEDTIME AS NEEDED FOR INSOMNIA 30 tablet 0  . Multiple Vitamin (MULTIVITAMIN WITH MINERALS) TABS tablet Take 2 tablets by mouth daily.    . valACYclovir (VALTREX) 500 MG tablet Take 1 tablet (500 mg total) by mouth daily. 30 tablet 1  . dexamethasone (DECADRON) 4 MG tablet Take 2 tablets (8 mg total) by mouth 2 (two) times daily. Start the day before Taxotere. Then again the day after chemo for 3 days. (Patient  not taking: Reported on 06/03/2015) 30 tablet 3  . ibuprofen (ADVIL,MOTRIN) 200 MG tablet Take 400-800 mg by mouth 2 (two) times daily as needed. Reported on 06/03/2015    . ondansetron (ZOFRAN) 8 MG tablet Take 1 tablet (8 mg total) by mouth 2 (two) times daily as needed for refractory nausea / vomiting. Start on day 3 after chemo. (Patient not taking: Reported on 05/21/2015) 30 tablet 1  . prochlorperazine (COMPAZINE) 10 MG tablet Take 1 tablet (10 mg total) by mouth every 6 (six) hours as needed (Nausea or vomiting). (Patient not taking: Reported on 05/21/2015) 30 tablet 1   No current facility-administered medications for this visit.    OBJECTIVE: Middle-aged white woman who  Filed Vitals:   06/03/15 0906  BP: 126/57  Pulse: 82  Temp: 98.4 F (36.9 C)  Resp: 18     Body mass index is 26.53 kg/(m^2).    ECOG FS:1 - Symptomatic but completely ambulatory  Skin: warm, dry  HEENT: sclerae anicteric, conjunctivae pink, oropharynx clear. No thrush or mucositis.  Lymph Nodes: No cervical or supraclavicular lymphadenopathy  Lungs: clear to auscultation bilaterally, no rales, wheezes, or rhonci  Heart: regular rate and rhythm  Abdomen: round, soft, non tender, positive bowel sounds  Musculoskeletal: No focal spinal tenderness, no peripheral edema  Neuro: non focal, well oriented, positive affect  Breasts: deferred  LAB RESULTS:  CMP     Component Value Date/Time   NA 140 06/03/2015 0850   NA 138 03/18/2015 1653   NA 140 08/15/2014 1104   K 3.6 06/03/2015 0850   K 4.3 03/18/2015 1653   CL 104 03/18/2015 1653   CO2 26 06/03/2015 0850   CO2 25 03/18/2015 1653   GLUCOSE 98 06/03/2015 0850   GLUCOSE 170* 03/18/2015 1653   GLUCOSE 87 08/15/2014 1104   BUN 12.3 06/03/2015 0850   BUN 9 03/18/2015 1653   BUN 18 08/15/2014 1104   CREATININE 0.8 06/03/2015 0850   CREATININE 0.83 03/18/2015 1653   CALCIUM 9.1 06/03/2015 0850   CALCIUM 8.9 03/18/2015 1653   PROT 6.8 06/03/2015 0850    ALBUMIN 3.4* 06/03/2015 0850   AST 17 06/03/2015 0850   ALT 17 06/03/2015 0850   ALKPHOS 78 06/03/2015 0850   BILITOT 0.49 06/03/2015 0850   GFRNONAA >60 03/18/2015 1653   GFRAA >60 03/18/2015 1653    INo results found for: SPEP, UPEP  Lab Results  Component Value Date   WBC 7.6 06/03/2015   NEUTROABS 5.8 06/03/2015   HGB 11.8 06/03/2015   HCT 35.9 06/03/2015   MCV 89.3 06/03/2015   PLT 387 06/03/2015      Chemistry      Component Value Date/Time   NA 140 06/03/2015 0850   NA 138 03/18/2015 1653   NA 140 08/15/2014 1104   K 3.6 06/03/2015 0850   K 4.3 03/18/2015 1653   CL 104 03/18/2015 1653   CO2 26 06/03/2015 0850   CO2 25 03/18/2015 1653   BUN 12.3 06/03/2015 0850   BUN 9 03/18/2015 1653   BUN 18 08/15/2014 1104   CREATININE 0.8 06/03/2015 0850   CREATININE 0.83 03/18/2015 1653      Component Value Date/Time   CALCIUM 9.1 06/03/2015 0850   CALCIUM 8.9 03/18/2015 1653   ALKPHOS 78 06/03/2015 0850   AST 17 06/03/2015 0850   ALT 17 06/03/2015 0850   BILITOT 0.49 06/03/2015 0850      No results found for: LABCA2  No components found for: LABCA125  No results for input(s): INR in the last 168 hours.  Urinalysis No results found for: COLORURINE, APPEARANCEUR, LABSPEC, PHURINE, GLUCOSEU, HGBUR, BILIRUBINUR, KETONESUR, PROTEINUR, UROBILINOGEN, NITRITE, LEUKOCYTESUR  STUDIES: No results found.  ASSESSMENT: 65 y.o. BRCA1 positive [place] woman  (1) ovarian cancer stage IIIB diagnosed April 2003  (a) s/p supracervical hysterectomy with surgical staging  (b) treated according to GOG 182: carboplatin/ topotecan x4 followed by carboplatin/taxol x4  (c) last chemotherapy dose October 2003  (2) status post left breast needle core biopsy 01/28/2015 for an invasive ductal carcinoma, grade 2, estrogen and progesterone receptor positive, HER-2 not amplified, with an MIB-1 of 10%  (a) tamoxifen started neoadjuvantly due to concerns regarding surgical delay,  interrupted with chemotherapy  (3) status post bilateral mastectomies with left axillary lymph node sampling 03/18/2015 showing  (a) on the right, benign disease  (b) on the left, a pT1c pN0, stage IA invasive ductal carcinoma, repeat HER-2 again negative, stage IA    (4) Oncotype DX score of 29, in the high intermediate range, predicts a risk of recurrence with tamoxifen alone of 19%. The addition of chemotherapy in this setting is predicted to decrease distant disease-free recurrence by approximately 6%.   (5) adjuvant chemotherapy with cyclophosphamide and docetaxel started 04/22/2015, to be repeated every 21 days 4  (a) gemcitabine switched for docetaxel for cycles 3 and 4 due to rash.  (6) adjuvant anti-estrogens to follow local treatment  PLAN: Vicki Hale looks and feels well today. The labs were reviewed in detail and were stable. She will proceed with cycle 3 of gemcitabine and cyclophosphamide as planned today.  Vicki Hale will return in 1 week for labs and a nadir visit. She understands and agrees with this plan. She knows the goal of treatment in her case is cure. She has been encouraged to call with any issues that might arise before her next visit here.   Laurie Panda, NP   06/03/2015 9:43 AM

## 2015-06-04 ENCOUNTER — Ambulatory Visit (HOSPITAL_BASED_OUTPATIENT_CLINIC_OR_DEPARTMENT_OTHER): Payer: BC Managed Care – PPO | Admitting: Nurse Practitioner

## 2015-06-04 ENCOUNTER — Telehealth: Payer: Self-pay

## 2015-06-04 ENCOUNTER — Telehealth: Payer: Self-pay | Admitting: *Deleted

## 2015-06-04 VITALS — BP 113/46 | HR 91 | Temp 98.4°F | Resp 18 | Ht 67.0 in | Wt 173.1 lb

## 2015-06-04 DIAGNOSIS — T7840XA Allergy, unspecified, initial encounter: Secondary | ICD-10-CM

## 2015-06-04 DIAGNOSIS — C50412 Malignant neoplasm of upper-outer quadrant of left female breast: Secondary | ICD-10-CM

## 2015-06-04 MED ORDER — METHYLPREDNISOLONE SODIUM SUCC 40 MG IJ SOLR
INTRAMUSCULAR | Status: AC
  Filled 2015-06-04: qty 2

## 2015-06-04 MED ORDER — METHYLPREDNISOLONE SODIUM SUCC 125 MG IJ SOLR: 80.0000 mg | Freq: Once | INTRAMUSCULAR | Status: DC

## 2015-06-04 MED ORDER — METHYLPREDNISOLONE SODIUM SUCC 125 MG IJ SOLR
80.0000 mg | INTRAMUSCULAR | Status: AC
  Administered 2015-06-04: 80 mg via INTRAMUSCULAR

## 2015-06-04 NOTE — Telephone Encounter (Signed)
Called Vicki Hale for chemotherapy F/U. Patient had a worse reaction than the last time and had just gotten home from the seeing [facility]. GM aware

## 2015-06-04 NOTE — Telephone Encounter (Signed)
-----   Message from Herschell Dimes, RN sent at 06/03/2015 11:02 AM EDT ----- Regarding: [name] - first time Gemzar follow up Contact: [phone] [name]'s patient Vicki Hale tolerated her first Gemzar well today - [phone]

## 2015-06-04 NOTE — Telephone Encounter (Signed)
Pt called and left message re:  Pt had received new chemo yesterday.  Today, pt developed rash and itching -  Much worse than last chemo treatment.   Pt stated she was en route to ER for evaluation. Pt's    Phone     [phone].

## 2015-06-05 ENCOUNTER — Encounter: Payer: Self-pay | Admitting: Nurse Practitioner

## 2015-06-05 ENCOUNTER — Ambulatory Visit: Payer: BC Managed Care – PPO

## 2015-06-05 NOTE — Assessment & Plan Note (Signed)
Patient had been receiving Taxotere/Cytoxan chemotherapy; and developed a significant generalized rash following her last cycle of chemotherapy.  She was then switched to gemcitabine/Cytoxan for cycle 3-which she received yesterday on 06/03/2015.  Patient states in the middle of the night.  She awoke with a much worse generalized rash that was pruritic.  She denies allergic-type symptoms whatsoever.  Patient states that she took Benadryl 25 mg earlier this morning.  Exam today reveals a bright red rash that is diffuse to her entire body.  She is managing all secretions well and her airways intact.  Vital signs are stable and patient is afebrile.  Patient will once again be given a Solu-Medrol 80 mg intramuscular injection; and was advised to continue taking the Benadryl and Pepcid as directed as well.  She has also started taking the dexamethasone 8 mg 3 times per day for the next few days as well.  Patient was advised to call/return of her directly to the emergency department for any worsening symptoms whatsoever.  Will review all findings with Dr. Jana Hakim; prior to patient receiving any further chemotherapy.

## 2015-06-05 NOTE — Progress Notes (Signed)
SYMPTOM MANAGEMENT CLINIC    Chief Complaint: Hypersensitivity reaction  HPI:  Vicki Hale 65 y.o. female diagnosed with breast cancer.  Currently undergoing Taxotere/Cytoxan chemotherapy regimen.    Patient received cycle 2 of her Taxotere/Cytoxan chemotherapy regimen on 05/13/2015.  Patient states that she had redness to entire face by the time she had driven home.  By the next morning-she had a full body rash/erythema; and some mild pruritus.  She denies any issues managing her secretions or her airway.  She denies any other new symptoms.  Patient states that she took Benadryl 50 mg prior to coming to the [facility] today.  Exam today reveals significant, diarrhea, diffuse rash from patient's face down to her knees.  Increased erythema to the chest area.  Patient is scratching occasionally.  Airway appears intact and patient is managing all oral secretions.  Vital signs were stable and patient was afebrile.  Patient was given Solu-Medrol 80 mg IM; and Pepcid 20 mg orally.  Patient was monitored closely for proximally one hour; and patient's rash/erythema was slowly improving.  Reviewed all findings with Dr. Francis Dowse he suggested that patient increase her dexamethasone to 8 mg 3 times per day for the next 2-3 days.  Dr. Jana Hakim advised he would change patient's chemotherapy to something that would be better tolerated. Will place Taxotere on pt's allergy list.   Also, patient was advised to go directly to the emergency department for any worsening symptoms whatsoever.  No history exists.    Review of Systems  Skin: Positive for itching and rash.  All other systems reviewed and are negative.   Past Medical History  Diagnosis Date  . Ovarian cancer ([place])   . Stroke ([place])   . Breast cancer ([place]) 2017    [facility] in [place]  . Family history of breast cancer   . FH: BRCA1 gene positive   . Family history of pancreatic cancer   . SAH (subarachnoid hemorrhage) ([place])       04/2014  . Pericarditis, acute     30 YRS AGO   . Arthritis     Past Surgical History  Procedure Laterality Date  . Tonsillectomy    . Hernia repair    . Total abdominal hysterectomy w/ bilateral salpingoophorectomy    . Cesarean section      X2  . Mastectomy w/ sentinel node biopsy Left 03/18/2015    Procedure: LEFT TOTAL MASTECTOMY WITH LEFT AXILLARY SENTINEL LYMPH NODE BIOPSY;  Surgeon: Excell Seltzer, MD;  Location: [facility];  Service: General;  Laterality: Left;  Marland Kitchen Mastectomy, partial Right 03/18/2015    Procedure: RIGHT PROPHYLACTIC MASTECTOMY ;  Surgeon: Excell Seltzer, MD;  Location: [facility];  Service: General;  Laterality: Right;  . Breast reconstruction with placement of tissue expander and flex hd (acellular hydrated dermis) Bilateral 03/18/2015    Procedure: BILATERAL BREAST RECONSTRUCTION WITH PLACEMENT OF TISSUE EXPANDER AND FLEX HD (ACELLULAR HYDRATED DERMIS);  Surgeon: Loel Lofty Dillingham, DO;  Location: [facility];  Service: Plastics;  Laterality: Bilateral;    has SAH (subarachnoid hemorrhage) ([place]); Breast cancer of upper-outer quadrant of left female breast ([place]); Family history of breast cancer; FH: BRCA1 gene positive; Family history of pancreatic cancer; Ovarian cancer, BRCA1 positive ([place]); Genetic testing; BRCA1 positive; and Hypersensitivity reaction on her problem list.    is allergic to taxotere and penicillins.    Medication List       This list is accurate as of: 06/04/15 11:59 PM.  Always use your  most recent med list.               B COMPLEX PO  Take 2 tablets by mouth daily.     dexamethasone 4 MG tablet  Commonly known as:  DECADRON  Take 2 tablets (8 mg total) by mouth 2 (two) times daily. Start the day before Taxotere. Then again the day after chemo for 3 days.     ibuprofen 200 MG tablet  Commonly known as:  ADVIL,MOTRIN  Take 400-800 mg by mouth 2 (two) times daily as needed. Reported on 06/03/2015     LORazepam 0.5 MG tablet  Commonly known  as:  ATIVAN  TAKE 1 TABLET BY MOUTH AT BEDTIME AS NEEDED FOR INSOMNIA     multivitamin with minerals Tabs tablet  Take 2 tablets by mouth daily.     ondansetron 8 MG tablet  Commonly known as:  ZOFRAN  Take 1 tablet (8 mg total) by mouth 2 (two) times daily as needed for refractory nausea / vomiting. Start on day 3 after chemo.     prochlorperazine 10 MG tablet  Commonly known as:  COMPAZINE  Take 1 tablet (10 mg total) by mouth every 6 (six) hours as needed (Nausea or vomiting).     valACYclovir 500 MG tablet  Commonly known as:  VALTREX  Take 1 tablet (500 mg total) by mouth daily.     vitamin C 1000 MG tablet  Take 1,000 mg by mouth 2 (two) times daily.         PHYSICAL EXAMINATION  Oncology Vitals 06/04/2015 06/03/2015  Height 170 cm 170 cm  Weight 78.518 kg 76.839 kg  Weight (lbs) 173 lbs 2 oz 169 lbs 6 oz  BMI (kg/m2) 27.11 kg/m2 26.53 kg/m2  Temp 98.4 98.4  Pulse 91 82  Resp 18 18  SpO2 95 97  BSA (m2) 1.93 m2 1.91 m2   BP Readings from Last 2 Encounters:  06/04/15 113/46  06/03/15 126/57    Physical Exam  Constitutional: She is oriented to person, place, and time and well-developed, well-nourished, and in no distress.  HENT:  Head: Normocephalic and atraumatic.  Eyes: Conjunctivae and EOM are normal. Pupils are equal, round, and reactive to light. Right eye exhibits no discharge. Left eye exhibits no discharge. No scleral icterus.  Neck: Normal range of motion.  Pulmonary/Chest: Effort normal. No respiratory distress.  Musculoskeletal: Normal range of motion.  Neurological: She is alert and oriented to person, place, and time. Gait normal.  Skin: Skin is warm and dry. Rash noted. There is erythema.  Psychiatric: Affect normal.  Nursing note and vitals reviewed.   LABORATORY DATA:. Appointment on 06/03/2015  Component Date Value Ref Range Status  . WBC 06/03/2015 7.6  3.9 - 10.3 10e3/uL Final  . NEUT# 06/03/2015 5.8  1.5 - 6.5 10e3/uL Final  . HGB  06/03/2015 11.8  11.6 - 15.9 g/dL Final  . HCT 06/03/2015 35.9  34.8 - 46.6 % Final  . Platelets 06/03/2015 387  145 - 400 10e3/uL Final  . MCV 06/03/2015 89.3  79.5 - 101.0 fL Final  . MCH 06/03/2015 29.5  25.1 - 34.0 pg Final  . MCHC 06/03/2015 33.0  31.5 - 36.0 g/dL Final  . RBC 06/03/2015 4.02  3.70 - 5.45 10e6/uL Final  . RDW 06/03/2015 15.0* 11.2 - 14.5 % Final  . lymph# 06/03/2015 0.9  0.9 - 3.3 10e3/uL Final  . MONO# 06/03/2015 0.7  0.1 - 0.9 10e3/uL Final  . Eosinophils Absolute  06/03/2015 0.0  0.0 - 0.5 10e3/uL Final  . Basophils Absolute 06/03/2015 0.1  0.0 - 0.1 10e3/uL Final  . NEUT% 06/03/2015 75.9  38.4 - 76.8 % Final  . LYMPH% 06/03/2015 12.5* 14.0 - 49.7 % Final  . MONO% 06/03/2015 9.5  0.0 - 14.0 % Final  . EOS% 06/03/2015 0.7  0.0 - 7.0 % Final  . BASO% 06/03/2015 1.4  0.0 - 2.0 % Final  . Sodium 06/03/2015 140  136 - 145 mEq/L Final  . Potassium 06/03/2015 3.6  3.5 - 5.1 mEq/L Final  . Chloride 06/03/2015 107  98 - 109 mEq/L Final  . CO2 06/03/2015 26  22 - 29 mEq/L Final  . Glucose 06/03/2015 98  70 - 140 mg/dl Final   Glucose reference range is for nonfasting patients. Fasting glucose reference range is 70- 100.  Marland Kitchen BUN 06/03/2015 12.3  7.0 - 26.0 mg/dL Final  . Creatinine 06/03/2015 0.8  0.6 - 1.1 mg/dL Final  . Total Bilirubin 06/03/2015 0.49  0.20 - 1.20 mg/dL Final  . Alkaline Phosphatase 06/03/2015 78  40 - 150 U/L Final  . AST 06/03/2015 17  5 - 34 U/L Final  . ALT 06/03/2015 17  0 - 55 U/L Final  . Total Protein 06/03/2015 6.8  6.4 - 8.3 g/dL Final  . Albumin 06/03/2015 3.4* 3.5 - 5.0 g/dL Final  . Calcium 06/03/2015 9.1  8.4 - 10.4 mg/dL Final  . Anion Gap 06/03/2015 6  3 - 11 mEq/L Final  . EGFR 06/03/2015 74* >90 ml/min/1.73 m2 Final   eGFR is calculated using the CKD-EPI Creatinine Equation (2009)   RADIOGRAPHIC STUDIES: No results found.  ASSESSMENT/PLAN:    Breast cancer of upper-outer quadrant of left female breast [facility]) Patient had been  receiving Taxotere/Cytoxan chemotherapy; and developed a significant generalized rash following her last cycle of chemotherapy.  She was then switched to gemcitabine/Cytoxan for cycle 3-which she received yesterday on 06/03/2015.  Patient states in the middle of the night.  She awoke with a much worse generalized rash that was pruritic.  She denies allergic-type symptoms whatsoever.  See further notes for details.  Patient will return on 06/10/2015 for labs and a follow-up visit.  Hypersensitivity reaction Patient had been receiving Taxotere/Cytoxan chemotherapy; and developed a significant generalized rash following her last cycle of chemotherapy.  She was then switched to gemcitabine/Cytoxan for cycle 3-which she received yesterday on 06/03/2015.  Patient states in the middle of the night.  She awoke with a much worse generalized rash that was pruritic.  She denies allergic-type symptoms whatsoever.  Patient states that she took Benadryl 25 mg earlier this morning.  Exam today reveals a bright red rash that is diffuse to her entire body.  She is managing all secretions well and her airways intact.  Vital signs are stable and patient is afebrile.  Patient will once again be given a Solu-Medrol 80 mg intramuscular injection; and was advised to continue taking the Benadryl and Pepcid as directed as well.  She has also started taking the dexamethasone 8 mg 3 times per day for the next few days as well.  Patient was advised to call/return of her directly to the emergency department for any worsening symptoms whatsoever.  Will review all findings with Dr. Jana Hakim; prior to patient receiving any further chemotherapy.     Patient stated understanding of all instructions; and was in agreement with this plan of care. The patient knows to call the clinic with any problems, questions  or concerns.   Total time spent with patient was 25 minutes;  with greater than 75 percent of that time spent in face to  face counseling regarding patient's symptoms,  and coordination of care and follow up.  Disclaimer:This dictation was prepared with Dragon/digital dictation along with [employer]. Any transcriptional errors that result from this process are unintentional.  Drue Second, NP 06/05/2015

## 2015-06-05 NOTE — Assessment & Plan Note (Signed)
Patient had been receiving Taxotere/Cytoxan chemotherapy; and developed a significant generalized rash following her last cycle of chemotherapy.  She was then switched to gemcitabine/Cytoxan for cycle 3-which she received yesterday on 06/03/2015.  Patient states in the middle of the night.  She awoke with a much worse generalized rash that was pruritic.  She denies allergic-type symptoms whatsoever.  See further notes for details.  Patient will return on 06/10/2015 for labs and a follow-up visit.

## 2015-06-09 ENCOUNTER — Other Ambulatory Visit: Payer: Self-pay | Admitting: Oncology

## 2015-06-10 ENCOUNTER — Ambulatory Visit (HOSPITAL_BASED_OUTPATIENT_CLINIC_OR_DEPARTMENT_OTHER): Payer: BC Managed Care – PPO | Admitting: Nurse Practitioner

## 2015-06-10 ENCOUNTER — Encounter: Payer: Self-pay | Admitting: Nurse Practitioner

## 2015-06-10 ENCOUNTER — Other Ambulatory Visit (HOSPITAL_BASED_OUTPATIENT_CLINIC_OR_DEPARTMENT_OTHER): Payer: BC Managed Care – PPO

## 2015-06-10 VITALS — BP 120/54 | HR 72 | Temp 98.3°F | Resp 18 | Wt 169.8 lb

## 2015-06-10 DIAGNOSIS — C50412 Malignant neoplasm of upper-outer quadrant of left female breast: Secondary | ICD-10-CM

## 2015-06-10 DIAGNOSIS — R21 Rash and other nonspecific skin eruption: Secondary | ICD-10-CM | POA: Diagnosis not present

## 2015-06-10 DIAGNOSIS — T7840XA Allergy, unspecified, initial encounter: Secondary | ICD-10-CM

## 2015-06-10 LAB — CBC WITH DIFFERENTIAL/PLATELET
BASO%: 0.5 % (ref 0.0–2.0)
Basophils Absolute: 0 10*3/uL (ref 0.0–0.1)
EOS%: 25.2 % — ABNORMAL HIGH (ref 0.0–7.0)
Eosinophils Absolute: 0.7 10*3/uL — ABNORMAL HIGH (ref 0.0–0.5)
HCT: 33.6 % — ABNORMAL LOW (ref 34.8–46.6)
HGB: 11.1 g/dL — ABNORMAL LOW (ref 11.6–15.9)
LYMPH%: 28.2 % (ref 14.0–49.7)
MCH: 29.5 pg (ref 25.1–34.0)
MCHC: 33 g/dL (ref 31.5–36.0)
MCV: 89.5 fL (ref 79.5–101.0)
MONO#: 0.5 10*3/uL (ref 0.1–0.9)
MONO%: 19.9 % — ABNORMAL HIGH (ref 0.0–14.0)
NEUT#: 0.7 10*3/uL — ABNORMAL LOW (ref 1.5–6.5)
NEUT%: 26.2 % — ABNORMAL LOW (ref 38.4–76.8)
Platelets: 154 10*3/uL (ref 145–400)
RBC: 3.76 10*6/uL (ref 3.70–5.45)
RDW: 14.7 % — ABNORMAL HIGH (ref 11.2–14.5)
WBC: 2.7 10*3/uL — ABNORMAL LOW (ref 3.9–10.3)
lymph#: 0.8 10*3/uL — ABNORMAL LOW (ref 0.9–3.3)

## 2015-06-10 LAB — COMPREHENSIVE METABOLIC PANEL
ALT: 61 U/L — ABNORMAL HIGH (ref 0–55)
AST: 30 U/L (ref 5–34)
Albumin: 3.5 g/dL (ref 3.5–5.0)
Alkaline Phosphatase: 87 U/L (ref 40–150)
Anion Gap: 10 mEq/L (ref 3–11)
BUN: 19.2 mg/dL (ref 7.0–26.0)
CO2: 25 mEq/L (ref 22–29)
Calcium: 9.7 mg/dL (ref 8.4–10.4)
Chloride: 105 mEq/L (ref 98–109)
Creatinine: 0.9 mg/dL (ref 0.6–1.1)
EGFR: 70 mL/min/{1.73_m2} — ABNORMAL LOW (ref >90)
Glucose: 105 mg/dl (ref 70–140)
Potassium: 4.1 mEq/L (ref 3.5–5.1)
Sodium: 140 mEq/L (ref 136–145)
Total Bilirubin: 0.5 mg/dL (ref 0.20–1.20)
Total Protein: 6.4 g/dL (ref 6.4–8.3)

## 2015-06-10 NOTE — Progress Notes (Signed)
[place]  [phone] [phone]   ID: Vicki Hale DOB: [birth year]  MR#: [id number]  OIZ#:[id number]  Patient Care Team: Leonides Sake, MD as PCP - General (Family Medicine) Chauncey Cruel, MD as Consulting Physician (Oncology) Rolm Bookbinder, MD as Consulting Physician (General Surgery) Consuella Lose, MD as Consulting Physician (Neurosurgery) Christene Slates, MD as Physician Assistant (Radiology) Rosalin Hawking, MD as Consulting Physician (Neurology) PCP: Leonides Sake, MD GYN: OTHER MD:  CHIEF COMPLAINT: Breast cancer in likely BRCA carrier  CURRENT TREATMENT: Adjuvant chemotherapy  CANCER HISTORY: From the original intake note:  [name] was diagnosed with ovarian cancer in April 2003. She underwent total abdominal hysterectomy and bilateral salpingo-oophorectomy with a full staging operation at [facility] that month and was then treated according to GOG 182, with 4 cycles of carboplatin/topotecan and then 4 cycles of carboplatin/paclitaxel. She was then followed by gynecologic oncology through 2011, and there has been no history of recurrent disease.  On 33/82/5053 Melainie underwent screening mammography at [facility] with tomosynthesis. Breast density was category A. New grouped calcifications were noted in the left breast upper outer quadrant and the patient was recalled for diagnostic unilateral left mammography 01/26/2015. This confirmed a group of new linear calcifications in the upper outer quadrant of the left breast extending over an area of 2.0 cm..   On 01/28/2015 the patient underwent biopsy of her left breast, with the pathology ([id number]) showing an invasive ductal carcinoma, grade 2, estrogen receptor 100% positive, progesterone receptor 50% positive, both with strong staining intensity, with an MIB-1 of 10% and no HER-2 amplification, the signals ratio being 1.21 and the number per cell 2.00.  Her subsequent history is as detailed  below  INTERVAL HISTORY: Suzzane returns today for follow-up of her estrogen receptor positive breast. Today is day 8, cycle 3 of 4 planned cycles of cyclophosphamide and gemcitabine given 21 days apart with onpro support. Again, Odeth developed a hypersensitivity reaction to treatment. On the night of treatment she developed a pruritic rash and she took benadryl and pepcid, but these were not effective. She was seen in the symptom management clinic where 64m solu-medrol was given IM. She continued on dexamethasone 847mTID and this cleared most of the rash. She still has 2 stubborn but sensitive lesions to her chest, to which she is applying neosporin.  REVIEW OF SYSTEMS: Besides the above, Magnolia has no complaints with treatment. She denies fevers, chills, nausea, vomiting, or changes in bowel or bladder habits. She has no mouth sores or neuropathy symptoms. Her appetite is good. She has no bone pain. She sleeps well at night. A detailed review of systems is otherwise stable.   Past Medical History  Diagnosis Date  . Ovarian cancer ([facility]  . Stroke ([facility]  . Breast cancer ([facility]    [facility] [place]. Family history of breast cancer   . FH: BRCA1 gene positive   . Family history of pancreatic cancer   . SAH (subarachnoid hemorrhage) ([facility]    04/2014  . Pericarditis, acute     30 YRS AGO   . Arthritis     PAST SURGICAL HISTORY: Past Surgical History  Procedure Laterality Date  . Tonsillectomy    . Hernia repair    . Total abdominal hysterectomy w/ bilateral salpingoophorectomy    . Cesarean section      X2  . Mastectomy w/ sentinel node biopsy Left 03/18/2015    Procedure: LEFT TOTAL MASTECTOMY  WITH LEFT AXILLARY SENTINEL LYMPH NODE BIOPSY;  Surgeon: Excell Seltzer, MD;  Location: [place];  Service: General;  Laterality: Left;  Marland Kitchen Mastectomy, partial Right 03/18/2015    Procedure: RIGHT PROPHYLACTIC MASTECTOMY ;  Surgeon: Excell Seltzer, MD;  Location: [place];  Service: General;   Laterality: Right;  . Breast reconstruction with placement of tissue expander and flex hd (acellular hydrated dermis) Bilateral 03/18/2015    Procedure: BILATERAL BREAST RECONSTRUCTION WITH PLACEMENT OF TISSUE EXPANDER AND FLEX HD (ACELLULAR HYDRATED DERMIS);  Surgeon: Loel Lofty Dillingham, DO;  Location: [place];  Service: Plastics;  Laterality: Bilateral;    FAMILY HISTORY Family History  Problem Relation Age of Onset  . Colon polyps Mother   . Colon cancer Neg Hx   . Rectal cancer Neg Hx   . Ulcerative colitis Neg Hx   . Stomach cancer Neg Hx   . Breast cancer Daughter 20  . BRCA 1/2 Daughter     BRCA1  . COPD Maternal Aunt   . Esophageal cancer Maternal Uncle   . Lung cancer Paternal Aunt   . Pancreatic cancer Maternal Grandmother     early 39s  . Prostate cancer Maternal Grandfather     71s  . Skin cancer Paternal Grandmother     untreated BCC  . COPD Maternal Aunt   . Lung cancer Maternal Aunt   . Breast cancer Cousin 9    maternal first cousin  . BRCA 1/2 Daughter     BRCA1  . Esophageal cancer Cousin     maternal first cousin  . Bladder Cancer Paternal Aunt    the patient's 2 daughters have been tested for the BRCA gene and both are positive. One of them had breast cancer at the age of 47. In addition on the maternal side there is pancreatic prostate breast and esophageal cancer. The patient's father died at the age of 82 and a car accident and there is very little information on his side of the family. The patient's mother died at 15 from COPD. She had had a hysterectomy in her 5s. The patient's mother had 5 siblings several from died young. The patient herself had no sisters, 2 brothers.  GYNECOLOGIC HISTORY:  No LMP recorded. Patient has had a hysterectomy.  Menarche age 52, first live birth age 19, the patient is [place] P2. She underwent TAH/BSO in 2003 for her ovarian cancer. She took hormone replacement for approximately 6 months. She was on oral contraceptives for  approximately 4 years remotely, with no complications.  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 Her husband Shanon Brow works for a [employer]. Daughter Joye Wesenberg lives in [place] where she works as a school principal. She is 90 years old. Daughter Tal Neer lives in [place] and is a Pharmacist, hospital. She is 64 years old. These ages are as of January 2017. The patient has 4 grandchildren. She attends a [employer]    ADVANCED DIRECTIVES: Not in place   HEALTH MAINTENANCE: Social History  Substance Use Topics  . Smoking status: Never Smoker   . Smokeless tobacco: Never Used  . Alcohol Use: 0.0 oz/week    0 Standard drinks or equivalent per week     Comment: ocassional; once monthly     Colonoscopy: September 2016/ [facility]  PAP:  Bone density: 2016  Lipid panel:  Allergies  Allergen Reactions  . Taxotere [Docetaxel] Rash  . Penicillins  Itching and Rash    Has patient had a PCN reaction causing immediate rash, facial/tongue/throat swelling, SOB or lightheadedness with hypotension: No Has patient had a PCN reaction causing severe rash involving mucus membranes or skin necrosis: No Has patient had a PCN reaction that required hospitalization No Has patient had a PCN reaction occurring within the last 10 years: No If all of the above answers are "NO", then may proceed with Cephalosporin use.     Current Outpatient Prescriptions  Medication Sig Dispense Refill  . Ascorbic Acid (VITAMIN C) 1000 MG tablet Take 1,000 mg by mouth 2 (two) times daily.    . B Complex Vitamins (B COMPLEX PO) Take 2 tablets by mouth daily.    Marland Kitchen dexamethasone (DECADRON) 4 MG tablet Take 2 tablets (8 mg total) by mouth 2 (two) times daily. Start the day before Taxotere. Then again the day after chemo for 3 days. (Patient not taking: Reported on 06/03/2015) 30 tablet 3  . ibuprofen (ADVIL,MOTRIN) 200 MG tablet Take 400-800 mg by  mouth 2 (two) times daily as needed. Reported on 06/03/2015    . LORazepam (ATIVAN) 0.5 MG tablet TAKE 1 TABLET BY MOUTH AT BEDTIME AS NEEDED FOR INSOMNIA 30 tablet 0  . Multiple Vitamin (MULTIVITAMIN WITH MINERALS) TABS tablet Take 2 tablets by mouth daily.    . ondansetron (ZOFRAN) 8 MG tablet Take 1 tablet (8 mg total) by mouth 2 (two) times daily as needed for refractory nausea / vomiting. Start on day 3 after chemo. (Patient not taking: Reported on 05/21/2015) 30 tablet 1  . prochlorperazine (COMPAZINE) 10 MG tablet Take 1 tablet (10 mg total) by mouth every 6 (six) hours as needed (Nausea or vomiting). (Patient not taking: Reported on 05/21/2015) 30 tablet 1  . valACYclovir (VALTREX) 500 MG tablet Take 1 tablet (500 mg total) by mouth daily. 30 tablet 1   No current facility-administered medications for this visit.    OBJECTIVE: Middle-aged white woman who  Filed Vitals:   06/10/15 0954  BP: 120/54  Pulse: 72  Temp: 98.3 F (36.8 C)  Resp: 18     Body mass index is 26.59 kg/(m^2).    ECOG FS:1 - Symptomatic but completely ambulatory  2 erythematous flat lesions to sternum, mild generalized erythema to chest and neck, no lesions elsewhere Sclerae unicteric, pupils round and equal Oropharynx clear and moist-- no thrush or other lesions No cervical or supraclavicular adenopathy Lungs no rales or rhonchi Heart regular rate and rhythm Abd soft, nontender, positive bowel sounds MSK no focal spinal tenderness, no upper extremity lymphedema Neuro: nonfocal, well oriented, appropriate affect Breasts: deferred  LAB RESULTS:  CMP     Component Value Date/Time   NA 140 06/10/2015 0936   NA 138 03/18/2015 1653   NA 140 08/15/2014 1104   K 4.1 06/10/2015 0936   K 4.3 03/18/2015 1653   CL 104 03/18/2015 1653   CO2 25 06/10/2015 0936   CO2 25 03/18/2015 1653   GLUCOSE 105 06/10/2015 0936   GLUCOSE 170* 03/18/2015 1653   GLUCOSE 87 08/15/2014 1104   BUN 19.2 06/10/2015 0936   BUN 9  03/18/2015 1653   BUN 18 08/15/2014 1104   CREATININE 0.9 06/10/2015 0936   CREATININE 0.83 03/18/2015 1653   CALCIUM 9.7 06/10/2015 0936   CALCIUM 8.9 03/18/2015 1653   PROT 6.4 06/10/2015 0936   ALBUMIN 3.5 06/10/2015 0936   AST 30 06/10/2015 0936   ALT 61* 06/10/2015 0936   ALKPHOS 87 06/10/2015  0936   BILITOT 0.50 06/10/2015 0936   GFRNONAA >60 03/18/2015 1653   GFRAA >60 03/18/2015 1653    INo results found for: SPEP, UPEP  Lab Results  Component Value Date   WBC 2.7* 06/10/2015   NEUTROABS 0.7* 06/10/2015   HGB 11.1* 06/10/2015   HCT 33.6* 06/10/2015   MCV 89.5 06/10/2015   PLT 154 06/10/2015      Chemistry      Component Value Date/Time   NA 140 06/10/2015 0936   NA 138 03/18/2015 1653   NA 140 08/15/2014 1104   K 4.1 06/10/2015 0936   K 4.3 03/18/2015 1653   CL 104 03/18/2015 1653   CO2 25 06/10/2015 0936   CO2 25 03/18/2015 1653   BUN 19.2 06/10/2015 0936   BUN 9 03/18/2015 1653   BUN 18 08/15/2014 1104   CREATININE 0.9 06/10/2015 0936   CREATININE 0.83 03/18/2015 1653      Component Value Date/Time   CALCIUM 9.7 06/10/2015 0936   CALCIUM 8.9 03/18/2015 1653   ALKPHOS 87 06/10/2015 0936   AST 30 06/10/2015 0936   ALT 61* 06/10/2015 0936   BILITOT 0.50 06/10/2015 0936      No results found for: LABCA2  No components found for: LABCA125  No results for input(s): INR in the last 168 hours.  Urinalysis No results found for: COLORURINE, APPEARANCEUR, LABSPEC, PHURINE, GLUCOSEU, HGBUR, BILIRUBINUR, KETONESUR, PROTEINUR, UROBILINOGEN, NITRITE, LEUKOCYTESUR  STUDIES: No results found.  ASSESSMENT: 65 y.o. BRCA1 positive [place], [place] woman  (1) ovarian cancer stage IIIB diagnosed April 2003  (a) s/p supracervical hysterectomy with surgical staging  (b) treated according to GOG 182: carboplatin/ topotecan x4 followed by carboplatin/taxol x4  (c) last chemotherapy dose October 2003  (2) status post left breast needle core biopsy 01/28/2015 for an  invasive ductal carcinoma, grade 2, estrogen and progesterone receptor positive, HER-2 not amplified, with an MIB-1 of 10%  (a) tamoxifen started neoadjuvantly due to concerns regarding surgical delay, interrupted with chemotherapy  (3) status post bilateral mastectomies with left axillary lymph node sampling 03/18/2015 showing  (a) on the right, benign disease  (b) on the left, a pT1c pN0, stage IA invasive ductal carcinoma, repeat HER-2 again negative, stage IA    (4) Oncotype DX score of 29, in the high intermediate range, predicts a risk of recurrence with tamoxifen alone of 19%. The addition of chemotherapy in this setting is predicted to decrease distant disease-free recurrence by approximately 6%.   (5) adjuvant chemotherapy with cyclophosphamide and docetaxel started 04/22/2015, to be repeated every 21 days 4  (a) gemcitabine switched for docetaxel for cycles 3 and 4 due to rash.  (6) adjuvant anti-estrogens to follow local treatment  PLAN: Dr. Jana Hakim and Danton Clap discussed her repeated reactions to chemotherapy. It is hard to trace the trigger to her rash, given that cyclophosphamide does not usually cause rashes and she has had reactions when both docetaxel and gemcitabine were used. Dr. Jana Hakim would feel comfortable with stopping treatment at this point, but Kiele feels uncomfortable stopping short at 3 of 4 cycles completed. They were able to compromise that she will proceed with cycle 4, with the condition that she return on day 2 for evaluation. If she has a rash at that point she will be given another IM injection of solumedrol, which was very helpful in the past. In addition, she will start her dexamethasone dose early at home, taking 49m the night of treatment instead of waiting until day 8.  Clodagh will return in 2 weeks for her 4th and final cycle. She understands and agrees with this plan. She knows the goal of treatment in her case is cure. She has been encouraged to call with  any issues that might arise before her next visit here.   Laurie Panda, NP   06/10/2015 10:28 AM    ADDENDUM: Danton Clap is tolerating her chemotherapy well except for the rash that she has had with the last 2 cycles. We had assumed the rash would be due to docetaxel but it recurred even though we changed that drug to Gemzar. She is very likely to have the same problemwith her last cycle if we proceed. My suggestion to her was that we omit the last cycle. That is the safest thing to do. On the other hand this is causing her considerable worry and anxiety. I really do think that if she does not get the last treatment she is going to feel 4 years extremely vulnerable and at the cancer does recur she is going to feel that was the reason it did recur.  Accordingly we decided she would proceed to the fourth cycle but she is going to start the dexamethasone for nausea the night of treatment and not the next morning. She is also going to call us morning after treatment and I will be glad to work her in if necessary if the rash does develop I think with these simple modifications it will be safe for her to receive the last treatment and thenshe will know that she has done everything that she can do to keep her cancer from coming back  She knows to call in any case for any other problems that may develop before then.   I personally saw this patient and performed a substantive portion of this encounter with the listed APP documented above.   Chauncey Cruel, MD Medical Oncology and Hematology [facility] [address] [place], [place] [address] Tel. [phone]    Fax. [phone]

## 2015-06-11 ENCOUNTER — Ambulatory Visit: Payer: Self-pay | Admitting: Genetic Counselor

## 2015-06-11 ENCOUNTER — Telehealth: Payer: Self-pay | Admitting: Genetic Counselor

## 2015-06-11 DIAGNOSIS — Z1379 Encounter for other screening for genetic and chromosomal anomalies: Secondary | ICD-10-CM

## 2015-06-11 DIAGNOSIS — C50412 Malignant neoplasm of upper-outer quadrant of left female breast: Secondary | ICD-10-CM

## 2015-06-11 DIAGNOSIS — C569 Malignant neoplasm of unspecified ovary: Secondary | ICD-10-CM

## 2015-06-11 DIAGNOSIS — Z800 Family history of malignant neoplasm of digestive organs: Secondary | ICD-10-CM

## 2015-06-11 DIAGNOSIS — Z1501 Genetic susceptibility to malignant neoplasm of breast: Secondary | ICD-10-CM

## 2015-06-11 DIAGNOSIS — Z1509 Genetic susceptibility to other malignant neoplasm: Secondary | ICD-10-CM

## 2015-06-11 DIAGNOSIS — Z803 Family history of malignant neoplasm of breast: Secondary | ICD-10-CM

## 2015-06-11 NOTE — Progress Notes (Addendum)
HPI: Ms. 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  After discussion about genetic testing and the personal history of colon polyps and family history of colon polyps, she became concerned about the risk for a hereditary colon cancer syndrome.  We discussed pursuing further testing, but that she did not meet the medical criteria for genetic testing for colon cancer.  Ms. Fragoso decided to pursue out of pocket testing.   Ms. Keizer was previously seen in the [place] clinic due to a personal and family history of breast and ovarian cancer, a known familial mutation in [place] and concerns regarding a hereditary predisposition to cancer. Please refer to our prior cancer genetics clinic note for more information regarding Ms. Wirtz's medical, social and family histories, and our assessment and recommendations, at the time. Ms. Rylander recent genetic test results were disclosed to her, as were recommendations warranted by these results. These results and recommendations are discussed in more detail below.  FAMILY HISTORY:  We obtained a detailed, 4-generation family history.  Significant diagnoses are listed below: Family History  Problem Relation Age of Onset  . Colon polyps Mother   . Colon cancer Neg Hx   . Rectal cancer Neg Hx   . Ulcerative colitis Neg Hx   . Stomach cancer Neg Hx   . Breast cancer Daughter 92  . BRCA 1/2 Daughter     BRCA1  . COPD Maternal Aunt   . Esophageal cancer Maternal Uncle   . Lung cancer Paternal Aunt   . Pancreatic cancer Maternal Grandmother     early 32s  . Prostate cancer Maternal Grandfather     26s  . Skin cancer Paternal Grandmother     untreated BCC  . COPD Maternal Aunt   . Lung cancer Maternal Aunt   . Breast cancer Cousin 33    maternal first cousin  . BRCA 1/2 Daughter     BRCA1  . Esophageal cancer Cousin     maternal first cousin  . Bladder Cancer Paternal Aunt      The patient has two daughters. One was diagnosed with breast cancer at age 80 and was found to be BRCA1 positive. The other daughter underwent genetic testing and also was found to be BRCA1 positive. The patient had one brother who died at 69 from suicide and one maternal half brother who is 17. Her parents are both deceased. Her father died in a car accident at 22 and her mother died at 67 and was cancer free. Her mother had four sisters and one brother. Her brother died from esophageal cancer, one sister died at 39 from COPD, another sister had lung cancer. The patient's uncle had a duaghter with breast cancer at 86. The patient's maternal grandmother was diagnosed with pancreatic cancer in her early 2s and her grandfather died in his 77s from prostate cancer. The patient's father had two sisters, one who had bladder cancer and the other with lung cancer. Her paternal grandmother died from untreated [place]. Patient's maternal ancestors are of Caucasian descent, and paternal ancestors are of English descent. There is no reported Ashkenazi Jewish ancestry. There is no known consanguinity.  GENETIC TEST RESULTS:At the time of Ms. Pett's visit, we recommended she pursue genetic testing of the [facility] 30- gene panel. The 30-Gene Cancer Panel offered by [facility] includes sequencing and/or deletion duplication testing of the following 30 genes: APC, ATM, BAP1, BARD1, BMPR1A, BRCA1, BRCA2, BRIP1,  CDH1, CDK4, CDKN2A (p14ARF), CDKN2A (p16INK4a), CHEK2, EPCAM, GREM1, MITF, MLH1, MSH2, MSH6, MUTYH, NBN, PALB2, PMS2, POLD1, POLE, PTEN, RAD51C, RAD51D, SMAD4, STK11, and TP53.   The report date is Jun 08, 2015.  Genetic testing found the familial BRCA1 mutation, but was otherwise normal, and did not reveal a deleterious mutation in the remaining 29 genes. The test report has been scanned into EPIC and is located under the Molecular Pathology section of the Results Review tab.   We discussed with Ms.  Southgate that since the current genetic testing is not perfect, it is possible there may be a gene mutation in one of these genes that current testing cannot detect, but that chance is small. We also discussed, that it is possible that another gene that has not yet been discovered, or that we have not yet tested, is responsible for the cancer diagnoses in the family, and it is, therefore, important to remain in touch with cancer genetics in the future so that we can continue to offer Ms. Lavergne the most up to date genetic testing.   CANCER SCREENING RECOMMENDATIONS: Please see previous clinic note dated May 11, 2015 for cancer screening recommendations based on the familial BRCA1 mutation.  RECOMMENDATIONS FOR FAMILY MEMBERS: Please see the previous clinic note dated May 11, 2015 for genetic testing recommendations for Ms. Bacorn's family members.   FOLLOW-UP: Lastly, we discussed with Ms. Shehan that cancer genetics is a rapidly advancing field and it is possible that new genetic tests will be appropriate for her and/or her family members in the future. We encouraged her to remain in contact with cancer genetics on an annual basis so we can update her personal and family histories and let her know of advances in cancer genetics that may benefit this family.   Our contact number was provided. Ms. Rosado questions were answered to her satisfaction, and she knows she is welcome to call us at anytime with additional questions or concerns.   Roma Kayser, MS, [facility] Certified Genetic Counselor [email or web address]

## 2015-06-11 NOTE — Telephone Encounter (Signed)
Revealed to patient that the testing found the known BRCA1 mutation, but that she was negative for any other pathogenic mutations in the 30 genes that were tested.  She will receive a copy of the test results through the patient portal in her email.

## 2015-06-24 ENCOUNTER — Encounter: Payer: Self-pay | Admitting: Nurse Practitioner

## 2015-06-24 ENCOUNTER — Ambulatory Visit (HOSPITAL_BASED_OUTPATIENT_CLINIC_OR_DEPARTMENT_OTHER): Payer: BC Managed Care – PPO

## 2015-06-24 ENCOUNTER — Other Ambulatory Visit (HOSPITAL_BASED_OUTPATIENT_CLINIC_OR_DEPARTMENT_OTHER): Payer: BC Managed Care – PPO

## 2015-06-24 ENCOUNTER — Ambulatory Visit (HOSPITAL_BASED_OUTPATIENT_CLINIC_OR_DEPARTMENT_OTHER): Payer: BC Managed Care – PPO | Admitting: Nurse Practitioner

## 2015-06-24 VITALS — BP 128/61 | HR 86 | Temp 97.6°F | Resp 18 | Ht 67.0 in | Wt 170.6 lb

## 2015-06-24 DIAGNOSIS — R21 Rash and other nonspecific skin eruption: Secondary | ICD-10-CM | POA: Diagnosis not present

## 2015-06-24 DIAGNOSIS — C50412 Malignant neoplasm of upper-outer quadrant of left female breast: Secondary | ICD-10-CM

## 2015-06-24 DIAGNOSIS — Z8543 Personal history of malignant neoplasm of ovary: Secondary | ICD-10-CM | POA: Diagnosis not present

## 2015-06-24 DIAGNOSIS — Z5112 Encounter for antineoplastic immunotherapy: Secondary | ICD-10-CM | POA: Diagnosis not present

## 2015-06-24 LAB — COMPREHENSIVE METABOLIC PANEL
ALT: 20 U/L (ref 0–55)
AST: 20 U/L (ref 5–34)
Albumin: 3.6 g/dL (ref 3.5–5.0)
Alkaline Phosphatase: 85 U/L (ref 40–150)
Anion Gap: 10 mEq/L (ref 3–11)
BUN: 13.7 mg/dL (ref 7.0–26.0)
CO2: 21 mEq/L — ABNORMAL LOW (ref 22–29)
Calcium: 9.6 mg/dL (ref 8.4–10.4)
Chloride: 111 mEq/L — ABNORMAL HIGH (ref 98–109)
Creatinine: 0.9 mg/dL (ref 0.6–1.1)
EGFR: 70 mL/min/{1.73_m2} — ABNORMAL LOW (ref >90)
Glucose: 158 mg/dl — ABNORMAL HIGH (ref 70–140)
Potassium: 4 mEq/L (ref 3.5–5.1)
Sodium: 141 mEq/L (ref 136–145)
Total Bilirubin: <0.3 mg/dL (ref 0.20–1.20)
Total Protein: 7 g/dL (ref 6.4–8.3)

## 2015-06-24 LAB — CBC WITH DIFFERENTIAL/PLATELET
BASO%: 0.9 % (ref 0.0–2.0)
Basophils Absolute: 0 10*3/uL (ref 0.0–0.1)
EOS%: 0 % (ref 0.0–7.0)
Eosinophils Absolute: 0 10*3/uL (ref 0.0–0.5)
HCT: 35.2 % (ref 34.8–46.6)
HGB: 11.7 g/dL (ref 11.6–15.9)
LYMPH%: 9.1 % — ABNORMAL LOW (ref 14.0–49.7)
MCH: 30.3 pg (ref 25.1–34.0)
MCHC: 33.1 g/dL (ref 31.5–36.0)
MCV: 91.4 fL (ref 79.5–101.0)
MONO#: 0 10*3/uL — ABNORMAL LOW (ref 0.1–0.9)
MONO%: 1.1 % (ref 0.0–14.0)
NEUT#: 3.5 10*3/uL (ref 1.5–6.5)
NEUT%: 88.9 % — ABNORMAL HIGH (ref 38.4–76.8)
Platelets: 580 10*3/uL — ABNORMAL HIGH (ref 145–400)
RBC: 3.85 10*6/uL (ref 3.70–5.45)
RDW: 16.7 % — ABNORMAL HIGH (ref 11.2–14.5)
WBC: 3.9 10*3/uL (ref 3.9–10.3)
lymph#: 0.4 10*3/uL — ABNORMAL LOW (ref 0.9–3.3)

## 2015-06-24 MED ORDER — SODIUM CHLORIDE 0.9 % IV SOLN
Freq: Once | INTRAVENOUS | Status: AC
  Administered 2015-06-24: 10:00:00 via INTRAVENOUS

## 2015-06-24 MED ORDER — PALONOSETRON HCL INJECTION 0.25 MG/5ML
0.2500 mg | Freq: Once | INTRAVENOUS | Status: AC
  Administered 2015-06-24: 0.25 mg via INTRAVENOUS

## 2015-06-24 MED ORDER — LORAZEPAM 0.5 MG PO TABS: 0.5000 mg | ORAL_TABLET | Freq: Every evening | ORAL | Status: DC | PRN

## 2015-06-24 MED ORDER — SODIUM CHLORIDE 0.9 % IV SOLN
600.0000 mg/m2 | Freq: Once | INTRAVENOUS | Status: AC
  Administered 2015-06-24: 1160 mg via INTRAVENOUS
  Filled 2015-06-24: qty 58

## 2015-06-24 MED ORDER — SODIUM CHLORIDE 0.9 % IV SOLN
10.0000 mg | Freq: Once | INTRAVENOUS | Status: AC
  Administered 2015-06-24: 10 mg via INTRAVENOUS
  Filled 2015-06-24: qty 1

## 2015-06-24 MED ORDER — PEGFILGRASTIM 6 MG/0.6ML ~~LOC~~ PSKT
6.0000 mg | PREFILLED_SYRINGE | Freq: Once | SUBCUTANEOUS | Status: AC
  Administered 2015-06-24: 6 mg via SUBCUTANEOUS
  Filled 2015-06-24: qty 0.6

## 2015-06-24 MED ORDER — SODIUM CHLORIDE 0.9 % IV SOLN
800.0000 mg/m2 | Freq: Once | INTRAVENOUS | Status: AC
  Administered 2015-06-24: 1520 mg via INTRAVENOUS
  Filled 2015-06-24: qty 39.98

## 2015-06-24 MED ORDER — PALONOSETRON HCL INJECTION 0.25 MG/5ML
INTRAVENOUS | Status: AC
  Filled 2015-06-24: qty 5

## 2015-06-24 NOTE — Progress Notes (Signed)
[place]  [phone] [phone]   ID: MANDA HOLSTAD DOB: [birth year]  MR#: [id number]  PZW#:[id number]  Patient Care Team: Leonides Sake, MD as PCP - General (Family Medicine) Chauncey Cruel, MD as Consulting Physician (Oncology) Rolm Bookbinder, MD as Consulting Physician (General Surgery) Consuella Lose, MD as Consulting Physician (Neurosurgery) Christene Slates, MD as Physician Assistant (Radiology) Rosalin Hawking, MD as Consulting Physician (Neurology) PCP: Leonides Sake, MD GYN: OTHER MD:  CHIEF COMPLAINT: Breast cancer in BRCA carrier  CURRENT TREATMENT: Adjuvant chemotherapy  CANCER HISTORY: From the original intake note:  Naomee was diagnosed with ovarian cancer in April 2003. She underwent total abdominal hysterectomy and bilateral salpingo-oophorectomy with a full staging operation at [facility] that month and was then treated according to GOG 182, with 4 cycles of carboplatin/topotecan and then 4 cycles of carboplatin/paclitaxel. She was then followed by gynecologic oncology through 2011, and there has been no history of recurrent disease.  On 42/35/3614 Elbony underwent screening mammography at [facility] with tomosynthesis. Breast density was category A. New grouped calcifications were noted in the left breast upper outer quadrant and the patient was recalled for diagnostic unilateral left mammography 01/26/2015. This confirmed a group of new linear calcifications in the upper outer quadrant of the left breast extending over an area of 2.0 cm..   On 01/28/2015 the patient underwent biopsy of her left breast, with the pathology ([id number]) showing an invasive ductal carcinoma, grade 2, estrogen receptor 100% positive, progesterone receptor 50% positive, both with strong staining intensity, with an MIB-1 of 10% and no HER-2 amplification, the signals ratio being 1.21 and the number per cell 2.00.  Her subsequent history is as detailed  below  INTERVAL HISTORY: Marylan returns today for follow-up of her estrogen receptor positive breast. Today is day 1, cycle 4 of 4 planned cycles of cyclophosphamide and gemcitabine given 21 days apart with onpro support. Robina plans to finish out treatment, despite reactions to the previous 2 cycles. Her rash has cleared up besides 1 small lesion to the central portion of her chest.   REVIEW OF SYSTEMS: A detailed review of systems is entirely negative otherwise.    Past Medical History  Diagnosis Date  . Ovarian cancer ([place])   . Stroke ([place])   . Breast cancer ([place]) 2017    [facility] in [place]  . Family history of breast cancer   . FH: BRCA1 gene positive   . Family history of pancreatic cancer   . SAH (subarachnoid hemorrhage) ([place])     04/2014  . Pericarditis, acute     30 YRS AGO   . Arthritis     PAST SURGICAL HISTORY: Past Surgical History  Procedure Laterality Date  . Tonsillectomy    . Hernia repair    . Total abdominal hysterectomy w/ bilateral salpingoophorectomy    . Cesarean section      X2  . Mastectomy w/ sentinel node biopsy Left 03/18/2015    Procedure: LEFT TOTAL MASTECTOMY WITH LEFT AXILLARY SENTINEL LYMPH NODE BIOPSY;  Surgeon: Excell Seltzer, MD;  Location: [facility];  Service: General;  Laterality: Left;  Marland Kitchen Mastectomy, partial Right 03/18/2015    Procedure: RIGHT PROPHYLACTIC MASTECTOMY ;  Surgeon: Excell Seltzer, MD;  Location: [facility];  Service: General;  Laterality: Right;  . Breast reconstruction with placement of tissue expander and flex hd (acellular hydrated dermis) Bilateral 03/18/2015    Procedure: BILATERAL BREAST RECONSTRUCTION WITH PLACEMENT OF TISSUE EXPANDER AND FLEX HD (ACELLULAR HYDRATED DERMIS);  Surgeon: Loel Lofty Dillingham, DO;  Location: [place];  Service: Plastics;  Laterality: Bilateral;    FAMILY HISTORY Family History  Problem Relation Age of Onset  . Colon polyps Mother   . Colon cancer Neg Hx   . Rectal cancer Neg Hx   . Ulcerative colitis  Neg Hx   . Stomach cancer Neg Hx   . Breast cancer Daughter 49  . BRCA 1/2 Daughter     BRCA1  . COPD Maternal Aunt   . Esophageal cancer Maternal Uncle   . Lung cancer Paternal Aunt   . Pancreatic cancer Maternal Grandmother     early 35s  . Prostate cancer Maternal Grandfather     79s  . Skin cancer Paternal Grandmother     untreated BCC  . COPD Maternal Aunt   . Lung cancer Maternal Aunt   . Breast cancer Cousin 25    maternal first cousin  . BRCA 1/2 Daughter     BRCA1  . Esophageal cancer Cousin     maternal first cousin  . Bladder Cancer Paternal Aunt    the patient's 2 daughters have been tested for the BRCA gene and both are positive. One of them had breast cancer at the age of 13. In addition on the maternal side there is pancreatic prostate breast and esophageal cancer. The patient's father died at the age of 80 and a car accident and there is very little information on his side of the family. The patient's mother died at 2 from COPD. She had had a hysterectomy in her 52s. The patient's mother had 5 siblings several from died young. The patient herself had no sisters, 2 brothers.  GYNECOLOGIC HISTORY:  No LMP recorded. Patient has had a hysterectomy.  Menarche age 50, first live birth age 22, the patient is [place] P2. She underwent TAH/BSO in 2003 for her ovarian cancer. She took hormone replacement for approximately 6 months. She was on oral contraceptives for approximately 4 years remotely, with no complications.  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 Her husband Shanon Brow works for a [employer]. Daughter Tanaisha Pittman lives in [place] where she works as a school principal. She is 46 years old. Daughter Yaritza Leist lives in [place] and is a Pharmacist, hospital. She is 64 years old. These ages are as of January 2017. The patient has 4 grandchildren. She attends a [employer]    ADVANCED DIRECTIVES:  Not in place   HEALTH MAINTENANCE: Social History  Substance Use Topics  . Smoking status: Never Smoker   . Smokeless tobacco: Never Used  . Alcohol Use: 0.0 oz/week    0 Standard drinks or equivalent per week     Comment: ocassional; once monthly     Colonoscopy: September 2016/ [facility]  PAP:  Bone density: 2016  Lipid panel:  Allergies  Allergen Reactions  . Taxotere [Docetaxel] Rash  . Penicillins Itching and Rash    Has patient had a PCN reaction causing immediate rash, facial/tongue/throat swelling, SOB or lightheadedness with hypotension: No Has patient had a PCN reaction causing severe rash involving mucus membranes or skin necrosis: No Has patient had a PCN reaction that required hospitalization No Has patient had a PCN reaction occurring within the last 10 years: No If all of the above answers are "NO", then may proceed with Cephalosporin use.     Current Outpatient Prescriptions  Medication Sig Dispense  Refill  . Ascorbic Acid (VITAMIN C) 1000 MG tablet Take 1,000 mg by mouth 2 (two) times daily.    . B Complex Vitamins (B COMPLEX PO) Take 2 tablets by mouth daily.    Marland Kitchen dexamethasone (DECADRON) 4 MG tablet Take 2 tablets (8 mg total) by mouth 2 (two) times daily. Start the day before Taxotere. Then again the day after chemo for 3 days. (Patient not taking: Reported on 06/03/2015) 30 tablet 3  . ibuprofen (ADVIL,MOTRIN) 200 MG tablet Take 400-800 mg by mouth 2 (two) times daily as needed. Reported on 06/03/2015    . LORazepam (ATIVAN) 0.5 MG tablet Take 1 tablet (0.5 mg total) by mouth at bedtime as needed for anxiety. 30 tablet 0  . Multiple Vitamin (MULTIVITAMIN WITH MINERALS) TABS tablet Take 2 tablets by mouth daily.    . ondansetron (ZOFRAN) 8 MG tablet Take 1 tablet (8 mg total) by mouth 2 (two) times daily as needed for refractory nausea / vomiting. Start on day 3 after chemo. (Patient not taking: Reported on 05/21/2015) 30 tablet 1  . prochlorperazine (COMPAZINE) 10  MG tablet Take 1 tablet (10 mg total) by mouth every 6 (six) hours as needed (Nausea or vomiting). (Patient not taking: Reported on 05/21/2015) 30 tablet 1  . valACYclovir (VALTREX) 500 MG tablet Take 1 tablet (500 mg total) by mouth daily. 30 tablet 1   No current facility-administered medications for this visit.    OBJECTIVE: Middle-aged white woman who  Filed Vitals:   06/24/15 0953  BP: 128/61  Pulse: 86  Temp: 97.6 F (36.4 C)  Resp: 18     Body mass index is 26.71 kg/(m^2).    ECOG FS:1 - Symptomatic but completely ambulatory  Skin: warm, dry, singular erythematous flat lesion to central chest, no other rashes or breakouts noted HEENT: sclerae anicteric, conjunctivae pink, oropharynx clear. No thrush or mucositis.  Lymph Nodes: No cervical or supraclavicular lymphadenopathy  Lungs: clear to auscultation bilaterally, no rales, wheezes, or rhonci  Heart: regular rate and rhythm  Abdomen: round, soft, non tender, positive bowel sounds  Musculoskeletal: No focal spinal tenderness, no peripheral edema  Neuro: non focal, well oriented, positive affect  Breasts: deferred  LAB RESULTS:  CMP     Component Value Date/Time   NA 140 06/10/2015 0936   NA 138 03/18/2015 1653   NA 140 08/15/2014 1104   K 4.1 06/10/2015 0936   K 4.3 03/18/2015 1653   CL 104 03/18/2015 1653   CO2 25 06/10/2015 0936   CO2 25 03/18/2015 1653   GLUCOSE 105 06/10/2015 0936   GLUCOSE 170* 03/18/2015 1653   GLUCOSE 87 08/15/2014 1104   BUN 19.2 06/10/2015 0936   BUN 9 03/18/2015 1653   BUN 18 08/15/2014 1104   CREATININE 0.9 06/10/2015 0936   CREATININE 0.83 03/18/2015 1653   CALCIUM 9.7 06/10/2015 0936   CALCIUM 8.9 03/18/2015 1653   PROT 6.4 06/10/2015 0936   ALBUMIN 3.5 06/10/2015 0936   AST 30 06/10/2015 0936   ALT 61* 06/10/2015 0936   ALKPHOS 87 06/10/2015 0936   BILITOT 0.50 06/10/2015 0936   GFRNONAA >60 03/18/2015 1653   GFRAA >60 03/18/2015 1653    INo results found for: SPEP,  UPEP  Lab Results  Component Value Date   WBC 3.9 06/24/2015   NEUTROABS 3.5 06/24/2015   HGB 11.7 06/24/2015   HCT 35.2 06/24/2015   MCV 91.4 06/24/2015   PLT 580* 06/24/2015      Chemistry  Component Value Date/Time   NA 140 06/10/2015 0936   NA 138 03/18/2015 1653   NA 140 08/15/2014 1104   K 4.1 06/10/2015 0936   K 4.3 03/18/2015 1653   CL 104 03/18/2015 1653   CO2 25 06/10/2015 0936   CO2 25 03/18/2015 1653   BUN 19.2 06/10/2015 0936   BUN 9 03/18/2015 1653   BUN 18 08/15/2014 1104   CREATININE 0.9 06/10/2015 0936   CREATININE 0.83 03/18/2015 1653      Component Value Date/Time   CALCIUM 9.7 06/10/2015 0936   CALCIUM 8.9 03/18/2015 1653   ALKPHOS 87 06/10/2015 0936   AST 30 06/10/2015 0936   ALT 61* 06/10/2015 0936   BILITOT 0.50 06/10/2015 0936      No results found for: LABCA2  No components found for: LABCA125  No results for input(s): INR in the last 168 hours.  Urinalysis No results found for: COLORURINE, APPEARANCEUR, LABSPEC, PHURINE, GLUCOSEU, HGBUR, BILIRUBINUR, KETONESUR, PROTEINUR, UROBILINOGEN, NITRITE, LEUKOCYTESUR  STUDIES: No results found.  ASSESSMENT: 65 y.o. BRCA1 positive [place] woman  (1) ovarian cancer stage IIIB diagnosed April 2003  (a) s/p supracervical hysterectomy with surgical staging  (b) treated according to GOG 182: carboplatin/ topotecan x4 followed by carboplatin/taxol x4  (c) last chemotherapy dose October 2003  (2) status post left breast needle core biopsy 01/28/2015 for an invasive ductal carcinoma, grade 2, estrogen and progesterone receptor positive, HER-2 not amplified, with an MIB-1 of 10%  (a) tamoxifen started neoadjuvantly due to concerns regarding surgical delay, interrupted with chemotherapy  (3) status post bilateral mastectomies with left axillary lymph node sampling 03/18/2015 showing  (a) on the right, benign disease  (b) on the left, a pT1c pN0, stage IA invasive ductal carcinoma, repeat  HER-2 again negative, stage IA    (4) Oncotype DX score of 29, in the high intermediate range, predicts a risk of recurrence with tamoxifen alone of 19%. The addition of chemotherapy in this setting is predicted to decrease distant disease-free recurrence by approximately 6%.   (5) adjuvant chemotherapy with cyclophosphamide and docetaxel started 04/22/2015, to be repeated every 21 days 4  (a) gemcitabine switched for docetaxel for cycles 3 and 4 due to rash.  (6) adjuvant anti-estrogens to follow local treatment  PLAN: Joslyn has not changed her mind about treatment today. She will proceed with her 4th and final cycle of gemcitabine and cyclophosphamide as planned. She premedicated with dexamethasone yesterday, and will start her next dose this afternoon, continuing for 2 additional days. She is going to call me tomorrow morning if she breaks into the full body rash again tonight, and we will administer 60m solu-medrol IM as we have done in the past.   ANishikawill return in 1 week for labs and a nadir visit. She understands and agrees with this plan. She knows the goal of treatment in her case is cure. She has been encouraged to call with any issues that might arise before her next visit here.    HLaurie Panda NP   06/24/2015 10:08 AM

## 2015-06-24 NOTE — Patient Instructions (Signed)
[facility] Discharge Instructions for Patients Receiving Chemotherapy  Today you received the following chemotherapy agents:  Cytoxan and Gemzar.  To help prevent nausea and vomiting after your treatment, we encourage you to take your nausea medication as prescribed.   If you develop nausea and vomiting that is not controlled by your nausea medication, call the clinic.   BELOW ARE SYMPTOMS THAT SHOULD BE REPORTED IMMEDIATELY:  *FEVER GREATER THAN 100.5 F  *CHILLS WITH OR WITHOUT FEVER  NAUSEA AND VOMITING THAT IS NOT CONTROLLED WITH YOUR NAUSEA MEDICATION  *UNUSUAL SHORTNESS OF BREATH  *UNUSUAL BRUISING OR BLEEDING  TENDERNESS IN MOUTH AND THROAT WITH OR WITHOUT PRESENCE OF ULCERS  *URINARY PROBLEMS  *BOWEL PROBLEMS  UNUSUAL RASH Items with * indicate a potential emergency and should be followed up as soon as possible.  Feel free to call the clinic you have any questions or concerns. The clinic phone number is [phone].  Please show the [facility] at check-in to the Emergency Department and triage nurse.

## 2015-06-25 ENCOUNTER — Telehealth: Payer: Self-pay | Admitting: Oncology

## 2015-06-25 ENCOUNTER — Telehealth: Payer: Self-pay | Admitting: *Deleted

## 2015-06-25 DIAGNOSIS — C50412 Malignant neoplasm of upper-outer quadrant of left female breast: Secondary | ICD-10-CM

## 2015-06-25 NOTE — Telephone Encounter (Signed)
  Oncology Nurse Navigator Documentation    Navigator Encounter Type: Telephone (06/25/15 1100) Telephone: Vicki Hale (06/25/15 1100)         Patient Visit Type: MedOnc (06/25/15 1100) Treatment Phase: Final Chemo TX (06/25/15 1100) Barriers/Navigation Needs: No barriers at this time;No Questions;No Needs (06/25/15 1100)   Interventions: Referrals (06/25/15 1100) Referrals: Survivorship (06/25/15 1100)          Acuity: Level 1 (06/25/15 1100)         Time Spent with Patient: 15 (06/25/15 1100)

## 2015-06-25 NOTE — Telephone Encounter (Signed)
Spoke with patient to confirm survivorship appt 8/8 at 11 am

## 2015-07-01 ENCOUNTER — Encounter: Payer: Self-pay | Admitting: Nurse Practitioner

## 2015-07-01 ENCOUNTER — Other Ambulatory Visit (HOSPITAL_BASED_OUTPATIENT_CLINIC_OR_DEPARTMENT_OTHER): Payer: BC Managed Care – PPO

## 2015-07-01 ENCOUNTER — Ambulatory Visit (HOSPITAL_BASED_OUTPATIENT_CLINIC_OR_DEPARTMENT_OTHER): Payer: BC Managed Care – PPO | Admitting: Nurse Practitioner

## 2015-07-01 VITALS — BP 141/61 | HR 64 | Temp 97.7°F | Resp 18 | Ht 67.0 in | Wt 167.8 lb

## 2015-07-01 DIAGNOSIS — Z8543 Personal history of malignant neoplasm of ovary: Secondary | ICD-10-CM | POA: Diagnosis not present

## 2015-07-01 DIAGNOSIS — C50412 Malignant neoplasm of upper-outer quadrant of left female breast: Secondary | ICD-10-CM

## 2015-07-01 DIAGNOSIS — D701 Agranulocytosis secondary to cancer chemotherapy: Secondary | ICD-10-CM

## 2015-07-01 DIAGNOSIS — T451X5A Adverse effect of antineoplastic and immunosuppressive drugs, initial encounter: Secondary | ICD-10-CM

## 2015-07-01 LAB — CBC WITH DIFFERENTIAL/PLATELET
BASO%: 0.8 % (ref 0.0–2.0)
Basophils Absolute: 0 10*3/uL (ref 0.0–0.1)
EOS%: 14.7 % — ABNORMAL HIGH (ref 0.0–7.0)
Eosinophils Absolute: 0.4 10*3/uL (ref 0.0–0.5)
HCT: 32.7 % — ABNORMAL LOW (ref 34.8–46.6)
HGB: 10.8 g/dL — ABNORMAL LOW (ref 11.6–15.9)
LYMPH%: 24.3 % (ref 14.0–49.7)
MCH: 30.1 pg (ref 25.1–34.0)
MCHC: 33 g/dL (ref 31.5–36.0)
MCV: 91.1 fL (ref 79.5–101.0)
MONO#: 0.8 10*3/uL (ref 0.1–0.9)
MONO%: 32.7 % — ABNORMAL HIGH (ref 0.0–14.0)
NEUT#: 0.7 10*3/uL — ABNORMAL LOW (ref 1.5–6.5)
NEUT%: 27.5 % — ABNORMAL LOW (ref 38.4–76.8)
Platelets: 153 10*3/uL (ref 145–400)
RBC: 3.59 10*6/uL — ABNORMAL LOW (ref 3.70–5.45)
RDW: 15.5 % — ABNORMAL HIGH (ref 11.2–14.5)
WBC: 2.5 10*3/uL — ABNORMAL LOW (ref 3.9–10.3)
lymph#: 0.6 10*3/uL — ABNORMAL LOW (ref 0.9–3.3)

## 2015-07-01 LAB — COMPREHENSIVE METABOLIC PANEL
ALT: 27 U/L (ref 0–55)
AST: 14 U/L (ref 5–34)
Albumin: 3.4 g/dL — ABNORMAL LOW (ref 3.5–5.0)
Alkaline Phosphatase: 86 U/L (ref 40–150)
Anion Gap: 6 mEq/L (ref 3–11)
BUN: 15.2 mg/dL (ref 7.0–26.0)
CO2: 28 mEq/L (ref 22–29)
Calcium: 9.2 mg/dL (ref 8.4–10.4)
Chloride: 104 mEq/L (ref 98–109)
Creatinine: 0.9 mg/dL (ref 0.6–1.1)
EGFR: 71 mL/min/{1.73_m2} — ABNORMAL LOW (ref >90)
Glucose: 84 mg/dl (ref 70–140)
Potassium: 4.2 mEq/L (ref 3.5–5.1)
Sodium: 138 mEq/L (ref 136–145)
Total Bilirubin: 0.45 mg/dL (ref 0.20–1.20)
Total Protein: 6 g/dL — ABNORMAL LOW (ref 6.4–8.3)

## 2015-07-01 NOTE — Progress Notes (Signed)
[facility]  [phone] [phone]   ID: Vicki Hale DOB: [birth year]  MR#: [id number]  BOF#:[id number]  Patient Care Team: Leonides Sake, MD as PCP - General (Family Medicine) Chauncey Cruel, MD as Consulting Physician (Oncology) Rolm Bookbinder, MD as Consulting Physician (General Surgery) Consuella Lose, MD as Consulting Physician (Neurosurgery) Christene Slates, MD as Physician Assistant (Radiology) Rosalin Hawking, MD as Consulting Physician (Neurology) PCP: Leonides Sake, MD GYN: OTHER MD:  CHIEF COMPLAINT: Breast cancer in BRCA carrier  CURRENT TREATMENT: Adjuvant chemotherapy  CANCER HISTORY: From the original intake note:  Vicki Hale was diagnosed with ovarian cancer in April 2003. She underwent total abdominal hysterectomy and bilateral salpingo-oophorectomy with a full staging operation at [facility] that month and was then treated according to GOG 182, with 4 cycles of carboplatin/topotecan and then 4 cycles of carboplatin/paclitaxel. She was then followed by gynecologic oncology through 2011, and there has been no history of recurrent disease.  On 77/82/4235 Gimena underwent screening mammography at [facility] with tomosynthesis. Breast density was category A. New grouped calcifications were noted in the left breast upper outer quadrant and the patient was recalled for diagnostic unilateral left mammography 01/26/2015. This confirmed a group of new linear calcifications in the upper outer quadrant of the left breast extending over an area of 2.0 cm..   On 01/28/2015 the patient underwent biopsy of her left breast, with the pathology ([id number]) showing an invasive ductal carcinoma, grade 2, estrogen receptor 100% positive, progesterone receptor 50% positive, both with strong staining intensity, with an MIB-1 of 10% and no HER-2 amplification, the signals ratio being 1.21 and the number per cell 2.00.  Her subsequent history is as detailed  below  INTERVAL HISTORY: Vicki Hale returns today for follow-up of her estrogen receptor positive breast. Today is day 8, cycle 4 of 4 planned cycles of cyclophosphamide and gemcitabine given 21 days apart with onpro support.   REVIEW OF SYSTEMS: Vicki Hale is pleased to inform me that she never developed a chest rash, as she had with the previous 2 cycles. She had few issues at all with this final treatment. She did notice slightly more bone pain after the neulasta, but thinks this was because this is the one treatment where she never received IM solu-medrol. A detailed review of systems is entirely negative otherwise.    Past Medical History  Diagnosis Date  . Ovarian cancer ([place])   . Stroke ([place])   . Breast cancer ([name]) 2017    [facility] in [place]  . Family history of breast cancer   . FH: BRCA1 gene positive   . Family history of pancreatic cancer   . SAH (subarachnoid hemorrhage) (Vicki Hale)     04/2014  . Pericarditis, acute     30 YRS AGO   . Arthritis     PAST SURGICAL HISTORY: Past Surgical History  Procedure Laterality Date  . Tonsillectomy    . Hernia repair    . Total abdominal hysterectomy w/ bilateral salpingoophorectomy    . Cesarean section      X2  . Mastectomy w/ sentinel node biopsy Left 03/18/2015    Procedure: LEFT TOTAL MASTECTOMY WITH LEFT AXILLARY SENTINEL LYMPH NODE BIOPSY;  Surgeon: Excell Seltzer, MD;  Location: [facility];  Service: General;  Laterality: Left;  Marland Kitchen Mastectomy, partial Right 03/18/2015    Procedure: RIGHT PROPHYLACTIC MASTECTOMY ;  Surgeon: Excell Seltzer, MD;  Location: [facility];  Service: General;  Laterality: Right;  . Breast reconstruction with placement of  tissue expander and flex hd (acellular hydrated dermis) Bilateral 03/18/2015    Procedure: BILATERAL BREAST RECONSTRUCTION WITH PLACEMENT OF TISSUE EXPANDER AND FLEX HD (ACELLULAR HYDRATED DERMIS);  Surgeon: Loel Lofty Dillingham, DO;  Location: [facility];  Service: Plastics;  Laterality: Bilateral;    FAMILY  HISTORY Family History  Problem Relation Age of Onset  . Colon polyps Mother   . Colon cancer Neg Hx   . Rectal cancer Neg Hx   . Ulcerative colitis Neg Hx   . Stomach cancer Neg Hx   . Breast cancer Daughter 59  . BRCA 1/2 Daughter     BRCA1  . COPD Maternal Aunt   . Esophageal cancer Maternal Uncle   . Lung cancer Paternal Aunt   . Pancreatic cancer Maternal Grandmother     early 29s  . Prostate cancer Maternal Grandfather     71s  . Skin cancer Paternal Grandmother     untreated BCC  . COPD Maternal Aunt   . Lung cancer Maternal Aunt   . Breast cancer Cousin 69    maternal first cousin  . BRCA 1/2 Daughter     BRCA1  . Esophageal cancer Cousin     maternal first cousin  . Bladder Cancer Paternal Aunt    the patient's 2 daughters have been tested for the BRCA gene and both are positive. One of them had breast cancer at the age of 18. In addition on the maternal side there is pancreatic prostate breast and esophageal cancer. The patient's father died at the age of 34 and a car accident and there is very little information on his side of the family. The patient's mother died at 8 from COPD. She had had a hysterectomy in her 18s. The patient's mother had 5 siblings several from died young. The patient herself had no sisters, 2 brothers.  GYNECOLOGIC HISTORY:  No LMP recorded. Patient has had a hysterectomy.  Menarche age 85, first live birth age 37, the patient is [place] P2. She underwent TAH/BSO in 2003 for her ovarian cancer. She took hormone replacement for approximately 6 months. She was on oral contraceptives for approximately 4 years remotely, with no complications.  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 Her husband Shanon Brow works for a [employer]. Daughter Vicki Hale lives in [place] where she works as a school principal. She is 18 years old. Daughter Vicki Hale lives in [place] and is a  Pharmacist, hospital. She is 64 years old. These ages are as of January 2017. The patient has 4 grandchildren. She attends a [employer]    ADVANCED DIRECTIVES: Not in place   HEALTH MAINTENANCE: Social History  Substance Use Topics  . Smoking status: Never Smoker   . Smokeless tobacco: Never Used  . Alcohol Use: 0.0 oz/week    0 Standard drinks or equivalent per week     Comment: ocassional; once monthly     Colonoscopy: September 2016/ [facility]  PAP:  Bone density: 2016  Lipid panel:  Allergies  Allergen Reactions  . Taxotere [Docetaxel] Rash  . Penicillins Itching and Rash    Has patient had a PCN reaction causing immediate rash, facial/tongue/throat swelling, SOB or lightheadedness with hypotension: No Has patient had a PCN reaction causing severe rash involving mucus membranes or skin necrosis: No Has patient had a PCN reaction that required hospitalization No Has patient had a PCN reaction occurring within the  last 10 years: No If all of the above answers are "NO", then may proceed with Cephalosporin use.     Current Outpatient Prescriptions  Medication Sig Dispense Refill  . Ascorbic Acid (VITAMIN C) 1000 MG tablet Take 1,000 mg by mouth 2 (two) times daily.    . B Complex Vitamins (B COMPLEX PO) Take 2 tablets by mouth daily.    Marland Kitchen dexamethasone (DECADRON) 4 MG tablet Take 2 tablets (8 mg total) by mouth 2 (two) times daily. Start the day before Taxotere. Then again the day after chemo for 3 days. (Patient not taking: Reported on 06/03/2015) 30 tablet 3  . ibuprofen (ADVIL,MOTRIN) 200 MG tablet Take 400-800 mg by mouth 2 (two) times daily as needed. Reported on 06/03/2015    . LORazepam (ATIVAN) 0.5 MG tablet Take 1 tablet (0.5 mg total) by mouth at bedtime as needed for anxiety. 30 tablet 0  . Multiple Vitamin (MULTIVITAMIN WITH MINERALS) TABS tablet Take 2 tablets by mouth daily.    . ondansetron (ZOFRAN) 8 MG tablet Take 1 tablet (8 mg total) by mouth 2 (two) times daily as  needed for refractory nausea / vomiting. Start on day 3 after chemo. (Patient not taking: Reported on 05/21/2015) 30 tablet 1  . prochlorperazine (COMPAZINE) 10 MG tablet Take 1 tablet (10 mg total) by mouth every 6 (six) hours as needed (Nausea or vomiting). (Patient not taking: Reported on 05/21/2015) 30 tablet 1  . valACYclovir (VALTREX) 500 MG tablet Take 1 tablet (500 mg total) by mouth daily. 30 tablet 1   No current facility-administered medications for this visit.    OBJECTIVE: Middle-aged white woman who  Filed Vitals:   07/01/15 1029  BP: 141/61  Pulse: 64  Temp: 97.7 F (36.5 C)  Resp: 18     Body mass index is 26.28 kg/(m^2).    ECOG FS:1 - Symptomatic but completely ambulatory  Skin: warm, dry,  HEENT: sclerae anicteric, conjunctivae pink, oropharynx clear. No thrush or mucositis.  Lymph Nodes: No cervical or supraclavicular lymphadenopathy  Lungs: clear to auscultation bilaterally, no rales, wheezes, or rhonci  Heart: regular rate and rhythm  Abdomen: round, soft, non tender, positive bowel sounds  Musculoskeletal: No focal spinal tenderness, no peripheral edema  Neuro: non focal, well oriented, positive affect  Breasts: deferred  LAB RESULTS:  CMP     Component Value Date/Time   NA 141 06/24/2015 0939   NA 138 03/18/2015 1653   NA 140 08/15/2014 1104   K 4.0 06/24/2015 0939   K 4.3 03/18/2015 1653   CL 104 03/18/2015 1653   CO2 21* 06/24/2015 0939   CO2 25 03/18/2015 1653   GLUCOSE 158* 06/24/2015 0939   GLUCOSE 170* 03/18/2015 1653   GLUCOSE 87 08/15/2014 1104   BUN 13.7 06/24/2015 0939   BUN 9 03/18/2015 1653   BUN 18 08/15/2014 1104   CREATININE 0.9 06/24/2015 0939   CREATININE 0.83 03/18/2015 1653   CALCIUM 9.6 06/24/2015 0939   CALCIUM 8.9 03/18/2015 1653   PROT 7.0 06/24/2015 0939   ALBUMIN 3.6 06/24/2015 0939   AST 20 06/24/2015 0939   ALT 20 06/24/2015 0939   ALKPHOS 85 06/24/2015 0939   BILITOT <0.30 06/24/2015 0939   GFRNONAA >60 03/18/2015  1653   GFRAA >60 03/18/2015 1653    INo results found for: SPEP, UPEP  Lab Results  Component Value Date   WBC 2.5* 07/01/2015   NEUTROABS 0.7* 07/01/2015   HGB 10.8* 07/01/2015   HCT 32.7* 07/01/2015  MCV 91.1 07/01/2015   PLT 153 07/01/2015      Chemistry      Component Value Date/Time   NA 141 06/24/2015 0939   NA 138 03/18/2015 1653   NA 140 08/15/2014 1104   K 4.0 06/24/2015 0939   K 4.3 03/18/2015 1653   CL 104 03/18/2015 1653   CO2 21* 06/24/2015 0939   CO2 25 03/18/2015 1653   BUN 13.7 06/24/2015 0939   BUN 9 03/18/2015 1653   BUN 18 08/15/2014 1104   CREATININE 0.9 06/24/2015 0939   CREATININE 0.83 03/18/2015 1653      Component Value Date/Time   CALCIUM 9.6 06/24/2015 0939   CALCIUM 8.9 03/18/2015 1653   ALKPHOS 85 06/24/2015 0939   AST 20 06/24/2015 0939   ALT 20 06/24/2015 0939   BILITOT <0.30 06/24/2015 0939      No results found for: LABCA2  No components found for: [id number]  No results for input(s): INR in the last 168 hours.  Urinalysis No results found for: COLORURINE, APPEARANCEUR, LABSPEC, PHURINE, GLUCOSEU, HGBUR, BILIRUBINUR, KETONESUR, PROTEINUR, UROBILINOGEN, NITRITE, LEUKOCYTESUR  STUDIES: No results found.  ASSESSMENT: 65 y.o. BRCA1 positive [place],  woman  (1) ovarian cancer stage IIIB diagnosed April 2003  (a) s/p supracervical hysterectomy with surgical staging  (b) treated according to GOG 182: carboplatin/ topotecan x4 followed by carboplatin/taxol x4  (c) last chemotherapy dose October 2003  (2) status post left breast needle core biopsy 01/28/2015 for an invasive ductal carcinoma, grade 2, estrogen and progesterone receptor positive, HER-2 not amplified, with an MIB-1 of 10%  (a) tamoxifen started neoadjuvantly due to concerns regarding surgical delay, interrupted with chemotherapy  (3) status post bilateral mastectomies with left axillary lymph node sampling 03/18/2015 showing  (a) on the right, benign disease  (b)  on the left, a pT1c pN0, stage IA invasive ductal carcinoma, repeat HER-2 again negative, stage IA    (4) Oncotype DX score of 29, in the high intermediate range, predicts a risk of recurrence with tamoxifen alone of 19%. The addition of chemotherapy in this setting is predicted to decrease distant disease-free recurrence by approximately 6%.   (5) adjuvant chemotherapy with cyclophosphamide and docetaxel started 04/22/2015, to be repeated every 21 days 4  (a) gemcitabine switched for docetaxel for cycles 3 and 4 due to rash.  (6) adjuvant anti-estrogens to follow local treatment  PLAN: [place] had a successful 4th and final chemotherapy treatment. The labs were reviewed in detail. Her ANC is down to 0.7. She knows to follow neutropenic precautions for the next few days.   [place] will return in 1 month for follow up with Dr. Jana Hakim. During this visit they will discuss initiating anti-estrogen therapy. She understands and agrees with this plan. She knows the goal of treatment in her case is cure. She has been encouraged to call with any issues that might arise before her next visit here.   Laurie Panda, NP   07/01/2015 10:50 AM

## 2015-07-28 NOTE — Progress Notes (Signed)
[place]  [phone] [phone]   ID: JAEDEN WESTBAY DOB: [birth year]  MR#: [id number]  AXE#:[id number]  Patient Care Team: Leonides Sake, MD as PCP - General (Family Medicine) Chauncey Cruel, MD as Consulting Physician (Oncology) Rolm Bookbinder, MD as Consulting Physician (General Surgery) Consuella Lose, MD as Consulting Physician (Neurosurgery) Christene Slates, MD as Physician Assistant (Radiology) Rosalin Hawking, MD as Consulting Physician (Neurology) PCP: Leonides Sake, MD GYN: OTHER MD:  CHIEF COMPLAINT: Breast cancer in BRCA carrier  CURRENT TREATMENT: Anastrozole  CANCER HISTORY: From the original intake note:  Levita was diagnosed with ovarian cancer in April 2003. She underwent total abdominal hysterectomy and bilateral salpingo-oophorectomy with a full staging operation at [facility] that month and was then treated according to GOG 182, with 4 cycles of carboplatin/topotecan and then 4 cycles of carboplatin/paclitaxel. She was then followed by gynecologic oncology through 2011, and there has been no history of recurrent disease.  On 11/20/1642 Layal underwent screening mammography at [facility] with tomosynthesis. Breast density was category A. New grouped calcifications were noted in the left breast upper outer quadrant and the patient was recalled for diagnostic unilateral left mammography 01/26/2015. This confirmed a group of new linear calcifications in the upper outer quadrant of the left breast extending over an area of 2.0 cm..   On 01/28/2015 the patient underwent biopsy of her left breast, with the pathology ([id number]) showing an invasive ductal carcinoma, grade 2, estrogen receptor 100% positive, progesterone receptor 50% positive, both with strong staining intensity, with an MIB-1 of 10% and no HER-2 amplification, the signals ratio being 1.21 and the number per cell 2.00.  Her subsequent history is as detailed below  INTERVAL  HISTORY: Suesan returns today for follow-up of her BRCA associated breast cancer. She completed her 4 cycles of adjuvant chemotherapy 06/24/2015. She generally tolerated that well, except for allergic symptoms. These of course were intercurrent. They have largely resolved.  She is now ready to discuss anti-estrogens.  REVIEW OF SYSTEMS: She experiences mild shortness of breath when climbing stairs, but gets to the top without stopping. A detailed review of systems is entirely negative otherwise.    Past Medical History  Diagnosis Date  . Ovarian cancer ([place])   . Breast cancer ([place]) 2017    [facility] in [place]  . Family history of breast cancer   . FH: BRCA1 gene positive   . Family history of pancreatic cancer   . SAH (subarachnoid hemorrhage) ([place])     04/2014  . Pericarditis, acute     30 YRS AGO   . Arthritis   . Stroke [facility])     no deficits    PAST SURGICAL HISTORY: Past Surgical History  Procedure Laterality Date  . Tonsillectomy    . Hernia repair    . Total abdominal hysterectomy w/ bilateral salpingoophorectomy    . Cesarean section      X2  . Mastectomy w/ sentinel node biopsy Left 03/18/2015    Procedure: LEFT TOTAL MASTECTOMY WITH LEFT AXILLARY SENTINEL LYMPH NODE BIOPSY;  Surgeon: Excell Seltzer, MD;  Location: [place];  Service: General;  Laterality: Left;  Marland Kitchen Mastectomy, partial Right 03/18/2015    Procedure: RIGHT PROPHYLACTIC MASTECTOMY ;  Surgeon: Excell Seltzer, MD;  Location: [place];  Service: General;  Laterality: Right;  . Breast reconstruction with placement of tissue expander and flex hd (acellular hydrated dermis) Bilateral 03/18/2015    Procedure: BILATERAL BREAST RECONSTRUCTION WITH PLACEMENT OF TISSUE EXPANDER AND FLEX  HD (ACELLULAR HYDRATED DERMIS);  Surgeon: Loel Lofty Dillingham, DO;  Location: [place];  Service: Plastics;  Laterality: Bilateral;    FAMILY HISTORY Family History  Problem Relation Age of Onset  . Colon polyps Mother   . Colon cancer Neg Hx     . Rectal cancer Neg Hx   . Ulcerative colitis Neg Hx   . Stomach cancer Neg Hx   . Breast cancer Daughter 67  . BRCA 1/2 Daughter     BRCA1  . COPD Maternal Aunt   . Esophageal cancer Maternal Uncle   . Lung cancer Paternal Aunt   . Pancreatic cancer Maternal Grandmother     early 85s  . Prostate cancer Maternal Grandfather     80s  . Skin cancer Paternal Grandmother     untreated BCC  . COPD Maternal Aunt   . Lung cancer Maternal Aunt   . Breast cancer Cousin 72    maternal first cousin  . BRCA 1/2 Daughter     BRCA1  . Esophageal cancer Cousin     maternal first cousin  . Bladder Cancer Paternal Aunt    the patient's 2 daughters have been tested for the BRCA gene and both are positive. One of them had breast cancer at the age of 64. In addition on the maternal side there is pancreatic prostate breast and esophageal cancer. The patient's father died at the age of 17 and a car accident and there is very little information on his side of the family. The patient's mother died at 25 from COPD. She had had a hysterectomy in her 77s. The patient's mother had 5 siblings several from died young. The patient herself had no sisters, 2 brothers.  GYNECOLOGIC HISTORY:  No LMP recorded. Patient has had a hysterectomy.  Menarche age 3, first live birth age 69, the patient is [place] P2. She underwent TAH/BSO in 2003 for her ovarian cancer. She took hormone replacement for approximately 6 months. She was on oral contraceptives for approximately 4 years remotely, with no complications.  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 Her husband Shanon Brow works for a [employer]. Daughter Margareta Laureano lives in [place] where she works as a school principal. She is 59 years old. Daughter Sumaiyah Markert lives in [place] and is a Pharmacist, hospital. She is 64 years old. These ages are as of January 2017. The patient has 4 grandchildren. She attends  a [employer]    ADVANCED DIRECTIVES: Not in place   HEALTH MAINTENANCE: Social History  Substance Use Topics  . Smoking status: Never Smoker   . Smokeless tobacco: Never Used  . Alcohol Use: 0.0 oz/week    0 Standard drinks or equivalent per week     Comment: ocassional; once monthly     Colonoscopy: September 2016/ [facility]  PAP:  Bone density: 2016  Lipid panel:  Allergies  Allergen Reactions  . Taxotere [Docetaxel] Rash  . Penicillins Itching and Rash    Has patient had a PCN reaction causing immediate rash, facial/tongue/throat swelling, SOB or lightheadedness with hypotension: No Has patient had a PCN reaction causing severe rash involving mucus membranes or skin necrosis: No Has patient had a PCN reaction that required hospitalization No Has patient had a PCN reaction occurring within the last 10 years: No If all of the above answers are "NO", then may proceed with Cephalosporin use.     Current  Outpatient Prescriptions  Medication Sig Dispense Refill  . anastrozole (ARIMIDEX) 1 MG tablet Take 1 tablet (1 mg total) by mouth daily. 90 tablet 4  . Ascorbic Acid (VITAMIN C) 1000 MG tablet Take 1,000 mg by mouth 2 (two) times daily.    . B Complex Vitamins (B COMPLEX PO) Take 2 tablets by mouth daily.    Marland Kitchen ibuprofen (ADVIL,MOTRIN) 200 MG tablet Take 400-800 mg by mouth 2 (two) times daily as needed. Reported on 06/03/2015    . LORazepam (ATIVAN) 0.5 MG tablet Take 1 tablet (0.5 mg total) by mouth at bedtime as needed for anxiety. 30 tablet 0  . Multiple Vitamin (MULTIVITAMIN WITH MINERALS) TABS tablet Take 2 tablets by mouth daily.    . valACYclovir (VALTREX) 500 MG tablet Take 1 tablet (500 mg total) by mouth daily. 30 tablet 1   No current facility-administered medications for this visit.    OBJECTIVE: Middle-aged white woman In no acute distress Filed Vitals:   07/29/15 1012  BP: 131/71  Pulse: 66  Temp: 98 F (36.7 C)  Resp: 18     Body mass index is  26.57 kg/(m^2).    ECOG FS:1 - Symptomatic but completely ambulatory  Sclerae unicteric, pupils round and equal Oropharynx clear and moist-- no thrush or other lesions No cervical or supraclavicular adenopathy Lungs no rales or rhonchi Heart regular rate and rhythm Abd soft, nontender, positive bowel sounds MSK no focal spinal tenderness, no upper extremity lymphedema Neuro: nonfocal, well oriented, appropriate affect Breasts: Status post bilateral mastectomies with no evidence of chest wall recurrence. Both axillae are benign.    LAB RESULTS:  CMP     Component Value Date/Time   NA 142 07/29/2015 0950   NA 138 03/18/2015 1653   NA 140 08/15/2014 1104   K 4.2 07/29/2015 0950   K 4.3 03/18/2015 1653   CL 104 03/18/2015 1653   CO2 25 07/29/2015 0950   CO2 25 03/18/2015 1653   GLUCOSE 89 07/29/2015 0950   GLUCOSE 170* 03/18/2015 1653   GLUCOSE 87 08/15/2014 1104   BUN 18.0 07/29/2015 0950   BUN 9 03/18/2015 1653   BUN 18 08/15/2014 1104   CREATININE 0.8 07/29/2015 0950   CREATININE 0.83 03/18/2015 1653   CALCIUM 9.4 07/29/2015 0950   CALCIUM 8.9 03/18/2015 1653   PROT 6.9 07/29/2015 0950   ALBUMIN 3.7 07/29/2015 0950   AST 21 07/29/2015 0950   ALT 18 07/29/2015 0950   ALKPHOS 91 07/29/2015 0950   BILITOT 0.34 07/29/2015 0950   GFRNONAA >60 03/18/2015 1653   GFRAA >60 03/18/2015 1653    INo results found for: SPEP, UPEP  Lab Results  Component Value Date   WBC 4.8 07/29/2015   NEUTROABS 3.3 07/29/2015   HGB 12.5 07/29/2015   HCT 37.7 07/29/2015   MCV 92.2 07/29/2015   PLT 397 07/29/2015      Chemistry      Component Value Date/Time   NA 142 07/29/2015 0950   NA 138 03/18/2015 1653   NA 140 08/15/2014 1104   K 4.2 07/29/2015 0950   K 4.3 03/18/2015 1653   CL 104 03/18/2015 1653   CO2 25 07/29/2015 0950   CO2 25 03/18/2015 1653   BUN 18.0 07/29/2015 0950   BUN 9 03/18/2015 1653   BUN 18 08/15/2014 1104   CREATININE 0.8 07/29/2015 0950   CREATININE  0.83 03/18/2015 1653      Component Value Date/Time   CALCIUM 9.4 07/29/2015 0950  CALCIUM 8.9 03/18/2015 1653   ALKPHOS 91 07/29/2015 0950   AST 21 07/29/2015 0950   ALT 18 07/29/2015 0950   BILITOT 0.34 07/29/2015 0950      No results found for: LABCA2  No components found for: LABCA125  No results for input(s): INR in the last 168 hours.  Urinalysis No results found for: COLORURINE, APPEARANCEUR, LABSPEC, PHURINE, GLUCOSEU, HGBUR, BILIRUBINUR, KETONESUR, PROTEINUR, UROBILINOGEN, NITRITE, LEUKOCYTESUR  STUDIES: No results found.  ASSESSMENT: 65 y.o. BRCA1 positive [place] woman  (1) ovarian cancer stage IIIB diagnosed April 2003  (a) s/p supracervical hysterectomy and BSO with surgical staging April 2003  (b) treated according to GOG 182: carboplatin/ topotecan x4 followed by carboplatin/taxol x4 Treated with (c) last chemotherapy dose October 2003  (2) status post left breast upper outer quadrant biopsy 01/28/2015 for an invasive ductal carcinoma, grade 2, estrogen and progesterone receptor positive, HER-2 not amplified, with an MIB-1 of 10%  (a) tamoxifen started neoadjuvantly due to concerns regarding surgical delay, interrupted with chemotherapy  (3) status post bilateral mastectomies with left axillary lymph node sampling 03/18/2015 showing  (a) on the right, benign disease  (b) on the left, a pT1c pN0, stage IA invasive ductal carcinoma, repeat HER-2 again negative, stage IA    (4) Oncotype DX score of 29, in the high intermediate range, predicts a risk of recurrence with tamoxifen alone of 19%. The addition of chemotherapy in this setting is predicted to decrease distant disease-free recurrence by approximately 6%.   (5) adjuvant chemotherapy with cyclophosphamide and docetaxel started 04/22/2015, repeated every 21 days 4  (a) gemcitabine switched for docetaxel for cycles 3 and 4 due to rash.  (b) chemotherapy completed 06/24/2015  (6) adjuvant anastrozole  started 07/29/2015  (a) bone density at [facility] 06/09/2014 shows a T score of -2.4 this is increased from prior  PLAN: Lindzee has recovered quite well from her chemotherapy and is ready to start anti-estrogens.  We discussed the difference between tamoxifen and anastrozole in detail. She understands that anastrozole and the aromatase inhibitors in general work by blocking estrogen production. Accordingly vaginal dryness, decrease in bone density, and of course hot flashes can result. The aromatase inhibitors can also negatively affect the cholesterol profile, although that is a minor effect. One out of 5 women on aromatase inhibitors we will feel "old and achy". This arthralgia/myalgia syndrome, which resembles fibromyalgia clinically, does resolve with stopping the medications. Accordingly this is not a reason to not try an aromatase inhibitor but it is a frequent reason to stop it (in other words 20% of women will not be able to tolerate these medications).  Tamoxifen on the other hand does not block estrogen production. It does not "take away a woman's estrogen". It blocks the estrogen receptor in breast cells. Like anastrozole, it can also cause hot flashes. As opposed to anastrozole, tamoxifen has many estrogen-like effects. It is technically an estrogen receptor modulator. This means that in some tissues tamoxifen works like estrogen-- for example it helps strengthen the bones. It tends to improve the cholesterol profile. It can cause thickening of the endometrial lining, and even endometrial polyps or rarely cancer of the uterus.(The risk of uterine cancer due to tamoxifen is one additional cancer per thousand women year). It can cause vaginal wetness or stickiness. It can cause blood clots through this estrogen-like effect--the risk of blood clots with tamoxifen is exactly the same as with birth control pills or hormone replacement.  Neither of these agents causes mood changes  or weight gain, despite  the popular belief that they can have these side effects. We have data from studies comparing either of these drugs with placebo, and in those cases the control group had the same amount of weight gain and depression as the group that took the drug.  After much discussion, we decided we are going to start with anastrozole. If she tolerates it well the plan will be to continue that for 5 years. We are going to review her bone density, which did show osteopenia, and consider denosumab depending on results (we do not have the report available today).  She has a good understanding of this plan, and agrees with it. She will call with any problems that may develop before her next visit here.   Chauncey Cruel, MD   07/31/2015 4:17 PM

## 2015-07-29 ENCOUNTER — Ambulatory Visit (HOSPITAL_BASED_OUTPATIENT_CLINIC_OR_DEPARTMENT_OTHER): Payer: BC Managed Care – PPO | Admitting: Oncology

## 2015-07-29 ENCOUNTER — Other Ambulatory Visit (HOSPITAL_BASED_OUTPATIENT_CLINIC_OR_DEPARTMENT_OTHER): Payer: BC Managed Care – PPO

## 2015-07-29 ENCOUNTER — Telehealth: Payer: Self-pay | Admitting: Oncology

## 2015-07-29 ENCOUNTER — Encounter (HOSPITAL_BASED_OUTPATIENT_CLINIC_OR_DEPARTMENT_OTHER): Payer: Self-pay | Admitting: *Deleted

## 2015-07-29 VITALS — BP 131/71 | HR 66 | Temp 98.0°F | Resp 18 | Ht 67.0 in | Wt 169.7 lb

## 2015-07-29 DIAGNOSIS — Z8543 Personal history of malignant neoplasm of ovary: Secondary | ICD-10-CM

## 2015-07-29 DIAGNOSIS — C569 Malignant neoplasm of unspecified ovary: Secondary | ICD-10-CM

## 2015-07-29 DIAGNOSIS — C50412 Malignant neoplasm of upper-outer quadrant of left female breast: Secondary | ICD-10-CM

## 2015-07-29 DIAGNOSIS — Z1501 Genetic susceptibility to malignant neoplasm of breast: Secondary | ICD-10-CM

## 2015-07-29 DIAGNOSIS — Z1509 Genetic susceptibility to other malignant neoplasm: Secondary | ICD-10-CM

## 2015-07-29 LAB — CBC WITH DIFFERENTIAL/PLATELET
BASO%: 0.3 % (ref 0.0–2.0)
Basophils Absolute: 0 10*3/uL (ref 0.0–0.1)
EOS%: 5.1 % (ref 0.0–7.0)
Eosinophils Absolute: 0.2 10*3/uL (ref 0.0–0.5)
HCT: 37.7 % (ref 34.8–46.6)
HGB: 12.5 g/dL (ref 11.6–15.9)
LYMPH%: 14.1 % (ref 14.0–49.7)
MCH: 30.5 pg (ref 25.1–34.0)
MCHC: 33.1 g/dL (ref 31.5–36.0)
MCV: 92.2 fL (ref 79.5–101.0)
MONO#: 0.6 10*3/uL (ref 0.1–0.9)
MONO%: 11.7 % (ref 0.0–14.0)
NEUT#: 3.3 10*3/uL (ref 1.5–6.5)
NEUT%: 68.8 % (ref 38.4–76.8)
Platelets: 397 10*3/uL (ref 145–400)
RBC: 4.08 10*6/uL (ref 3.70–5.45)
RDW: 14.9 % — ABNORMAL HIGH (ref 11.2–14.5)
WBC: 4.8 10*3/uL (ref 3.9–10.3)
lymph#: 0.7 10*3/uL — ABNORMAL LOW (ref 0.9–3.3)

## 2015-07-29 LAB — COMPREHENSIVE METABOLIC PANEL
ALT: 18 U/L (ref 0–55)
AST: 21 U/L (ref 5–34)
Albumin: 3.7 g/dL (ref 3.5–5.0)
Alkaline Phosphatase: 91 U/L (ref 40–150)
Anion Gap: 8 mEq/L (ref 3–11)
BUN: 18 mg/dL (ref 7.0–26.0)
CO2: 25 mEq/L (ref 22–29)
Calcium: 9.4 mg/dL (ref 8.4–10.4)
Chloride: 109 mEq/L (ref 98–109)
Creatinine: 0.8 mg/dL (ref 0.6–1.1)
EGFR: 76 mL/min/{1.73_m2} — ABNORMAL LOW (ref >90)
Glucose: 89 mg/dl (ref 70–140)
Potassium: 4.2 mEq/L (ref 3.5–5.1)
Sodium: 142 mEq/L (ref 136–145)
Total Bilirubin: 0.34 mg/dL (ref 0.20–1.20)
Total Protein: 6.9 g/dL (ref 6.4–8.3)

## 2015-07-29 NOTE — Telephone Encounter (Signed)
appt made and avs printed °

## 2015-07-31 MED ORDER — ANASTROZOLE 1 MG PO TABS: 1.0000 mg | ORAL_TABLET | Freq: Every day | ORAL | Status: DC

## 2015-08-05 ENCOUNTER — Other Ambulatory Visit: Payer: Self-pay | Admitting: Plastic Surgery

## 2015-08-05 DIAGNOSIS — Z9013 Acquired absence of bilateral breasts and nipples: Secondary | ICD-10-CM

## 2015-08-11 ENCOUNTER — Encounter (HOSPITAL_BASED_OUTPATIENT_CLINIC_OR_DEPARTMENT_OTHER): Payer: Self-pay | Admitting: *Deleted

## 2015-08-12 ENCOUNTER — Encounter (HOSPITAL_BASED_OUTPATIENT_CLINIC_OR_DEPARTMENT_OTHER)
Admission: RE | Disposition: A | Discharge status: Home / Self Care | Payer: Self-pay | Source: Ambulatory Visit | Attending: Plastic Surgery

## 2015-08-12 ENCOUNTER — Ambulatory Visit (HOSPITAL_BASED_OUTPATIENT_CLINIC_OR_DEPARTMENT_OTHER): Payer: BC Managed Care – PPO | Admitting: Anesthesiology

## 2015-08-12 ENCOUNTER — Ambulatory Visit (HOSPITAL_BASED_OUTPATIENT_CLINIC_OR_DEPARTMENT_OTHER)
Admission: RE | Admit: 2015-08-12 | Discharge: 2015-08-12 | Disposition: A | Discharge status: Home / Self Care | Payer: BC Managed Care – PPO | Source: Ambulatory Visit | Attending: Plastic Surgery | Admitting: Plastic Surgery

## 2015-08-12 ENCOUNTER — Encounter (HOSPITAL_BASED_OUTPATIENT_CLINIC_OR_DEPARTMENT_OTHER): Payer: Self-pay | Admitting: Anesthesiology

## 2015-08-12 DIAGNOSIS — Z8673 Personal history of transient ischemic attack (TIA), and cerebral infarction without residual deficits: Secondary | ICD-10-CM | POA: Insufficient documentation

## 2015-08-12 DIAGNOSIS — Z1501 Genetic susceptibility to malignant neoplasm of breast: Secondary | ICD-10-CM | POA: Insufficient documentation

## 2015-08-12 DIAGNOSIS — M1990 Unspecified osteoarthritis, unspecified site: Secondary | ICD-10-CM | POA: Insufficient documentation

## 2015-08-12 DIAGNOSIS — Z853 Personal history of malignant neoplasm of breast: Secondary | ICD-10-CM | POA: Insufficient documentation

## 2015-08-12 DIAGNOSIS — Z808 Family history of malignant neoplasm of other organs or systems: Secondary | ICD-10-CM | POA: Insufficient documentation

## 2015-08-12 DIAGNOSIS — Z9013 Acquired absence of bilateral breasts and nipples: Secondary | ICD-10-CM | POA: Diagnosis not present

## 2015-08-12 DIAGNOSIS — Z421 Encounter for breast reconstruction following mastectomy: Secondary | ICD-10-CM | POA: Diagnosis present

## 2015-08-12 DIAGNOSIS — Z803 Family history of malignant neoplasm of breast: Secondary | ICD-10-CM | POA: Diagnosis not present

## 2015-08-12 DIAGNOSIS — Z880 Allergy status to penicillin: Secondary | ICD-10-CM | POA: Insufficient documentation

## 2015-08-12 HISTORY — PX: REMOVAL OF BILATERAL TISSUE EXPANDERS WITH PLACEMENT OF BILATERAL BREAST IMPLANTS: SHX6431

## 2015-08-12 SURGERY — REMOVAL, TISSUE EXPANDER, BREAST, BILATERAL, WITH BILATERAL IMPLANT IMPLANT INSERTION
Anesthesia: General | Site: Breast

## 2015-08-12 MED ORDER — ROCURONIUM BROMIDE 100 MG/10ML IV SOLN
INTRAVENOUS | Status: DC | PRN
  Administered 2015-08-12: 50 mg via INTRAVENOUS

## 2015-08-12 MED ORDER — ONDANSETRON HCL 4 MG/2ML IJ SOLN
INTRAMUSCULAR | Status: DC | PRN
  Administered 2015-08-12: 4 mg via INTRAVENOUS

## 2015-08-12 MED ORDER — ONDANSETRON HCL 4 MG/2ML IJ SOLN
INTRAMUSCULAR | Status: AC
  Filled 2015-08-12: qty 2

## 2015-08-12 MED ORDER — MIDAZOLAM HCL 2 MG/2ML IJ SOLN
INTRAMUSCULAR | Status: AC
  Filled 2015-08-12: qty 2

## 2015-08-12 MED ORDER — EPHEDRINE SULFATE 50 MG/ML IJ SOLN
INTRAMUSCULAR | Status: DC | PRN
  Administered 2015-08-12 (×2): 10 mg via INTRAVENOUS

## 2015-08-12 MED ORDER — CIPROFLOXACIN IN D5W 400 MG/200ML IV SOLN
400.0000 mg | INTRAVENOUS | Status: AC
  Administered 2015-08-12: 400 mg via INTRAVENOUS

## 2015-08-12 MED ORDER — PROPOFOL 10 MG/ML IV BOLUS
INTRAVENOUS | Status: AC
  Filled 2015-08-12: qty 40

## 2015-08-12 MED ORDER — FENTANYL CITRATE (PF) 100 MCG/2ML IJ SOLN: 25.0000 ug | INTRAMUSCULAR | Status: DC | PRN

## 2015-08-12 MED ORDER — LACTATED RINGERS IV SOLN
INTRAVENOUS | Status: DC
  Administered 2015-08-12 (×2): via INTRAVENOUS

## 2015-08-12 MED ORDER — SUGAMMADEX SODIUM 200 MG/2ML IV SOLN
INTRAVENOUS | Status: DC | PRN
  Administered 2015-08-12: 150 mg via INTRAVENOUS

## 2015-08-12 MED ORDER — SCOPOLAMINE 1 MG/3DAYS TD PT72
1.0000 | MEDICATED_PATCH | Freq: Once | TRANSDERMAL | Status: DC | PRN
Start: 2015-08-12 — End: 2015-08-12

## 2015-08-12 MED ORDER — PROPOFOL 10 MG/ML IV BOLUS
INTRAVENOUS | Status: DC | PRN
  Administered 2015-08-12: 140 mg via INTRAVENOUS

## 2015-08-12 MED ORDER — MIDAZOLAM HCL 2 MG/2ML IJ SOLN
1.0000 mg | INTRAMUSCULAR | Status: DC | PRN
  Administered 2015-08-12: 2 mg via INTRAVENOUS

## 2015-08-12 MED ORDER — LIDOCAINE 2% (20 MG/ML) 5 ML SYRINGE
INTRAMUSCULAR | Status: AC
  Filled 2015-08-12: qty 5

## 2015-08-12 MED ORDER — SODIUM CHLORIDE 0.9 % IR SOLN
Status: DC | PRN
  Administered 2015-08-12: 500 mL

## 2015-08-12 MED ORDER — LIDOCAINE HCL (CARDIAC) 20 MG/ML IV SOLN
INTRAVENOUS | Status: DC | PRN
  Administered 2015-08-12: 80 mg via INTRAVENOUS

## 2015-08-12 MED ORDER — FENTANYL CITRATE (PF) 100 MCG/2ML IJ SOLN
INTRAMUSCULAR | Status: AC
  Filled 2015-08-12: qty 2

## 2015-08-12 MED ORDER — FENTANYL CITRATE (PF) 100 MCG/2ML IJ SOLN
50.0000 ug | INTRAMUSCULAR | Status: DC | PRN
  Administered 2015-08-12: 100 ug via INTRAVENOUS

## 2015-08-12 MED ORDER — CIPROFLOXACIN IN D5W 400 MG/200ML IV SOLN
INTRAVENOUS | Status: AC
  Filled 2015-08-12: qty 200

## 2015-08-12 MED ORDER — CHLORHEXIDINE GLUCONATE CLOTH 2 % EX PADS: 6.0000 | MEDICATED_PAD | Freq: Once | CUTANEOUS | Status: DC

## 2015-08-12 MED ORDER — BUPIVACAINE-EPINEPHRINE 0.25% -1:200000 IJ SOLN
INTRAMUSCULAR | Status: DC | PRN
  Administered 2015-08-12: 6 mL

## 2015-08-12 MED ORDER — PROMETHAZINE HCL 25 MG/ML IJ SOLN: 6.2500 mg | INTRAMUSCULAR | Status: DC | PRN

## 2015-08-12 MED ORDER — GLYCOPYRROLATE 0.2 MG/ML IJ SOLN: 0.2000 mg | Freq: Once | INTRAMUSCULAR | Status: DC | PRN

## 2015-08-12 MED ORDER — DEXAMETHASONE SODIUM PHOSPHATE 10 MG/ML IJ SOLN
INTRAMUSCULAR | Status: AC
  Filled 2015-08-12: qty 1

## 2015-08-12 MED ORDER — DEXAMETHASONE SODIUM PHOSPHATE 4 MG/ML IJ SOLN
INTRAMUSCULAR | Status: DC | PRN
  Administered 2015-08-12: 10 mg via INTRAVENOUS

## 2015-08-12 SURGICAL SUPPLY — 79 items
BAG DECANTER FOR FLEXI CONT (MISCELLANEOUS) IMPLANT
BINDER ABDOMINAL  9 SM 30-45 (SOFTGOODS)
BINDER ABDOMINAL 10 UNV 27-48 (MISCELLANEOUS) IMPLANT
BINDER ABDOMINAL 12 SM 30-45 (SOFTGOODS) IMPLANT
BINDER ABDOMINAL 9 SM 30-45 (SOFTGOODS) IMPLANT
BINDER BREAST LRG (GAUZE/BANDAGES/DRESSINGS) ×4 IMPLANT
BINDER BREAST MEDIUM (GAUZE/BANDAGES/DRESSINGS) IMPLANT
BINDER BREAST XLRG (GAUZE/BANDAGES/DRESSINGS) IMPLANT
BINDER BREAST XXLRG (GAUZE/BANDAGES/DRESSINGS) IMPLANT
BIOPATCH RED 1 DISK 7.0 (GAUZE/BANDAGES/DRESSINGS) IMPLANT
BIOPATCH RED 1IN DISK 7.0MM (GAUZE/BANDAGES/DRESSINGS)
BLADE HEX COATED 2.75 (ELECTRODE) ×4 IMPLANT
BLADE SURG 15 STRL LF DISP TIS (BLADE) ×4 IMPLANT
BLADE SURG 15 STRL SS (BLADE) ×4
BNDG GAUZE ELAST 4 BULKY (GAUZE/BANDAGES/DRESSINGS) ×8 IMPLANT
CANISTER SUCT 1200ML W/VALVE (MISCELLANEOUS) ×4 IMPLANT
CHLORAPREP W/TINT 26ML (MISCELLANEOUS) ×4 IMPLANT
CORDS BIPOLAR (ELECTRODE) IMPLANT
COVER BACK TABLE 60X90IN (DRAPES) ×4 IMPLANT
COVER MAYO STAND STRL (DRAPES) ×4 IMPLANT
DECANTER SPIKE VIAL GLASS SM (MISCELLANEOUS) IMPLANT
DERMABOND ADVANCED (GAUZE/BANDAGES/DRESSINGS) ×2
DERMABOND ADVANCED .7 DNX12 (GAUZE/BANDAGES/DRESSINGS) ×2 IMPLANT
DRAIN CHANNEL 19F RND (DRAIN) IMPLANT
DRAPE LAPAROSCOPIC ABDOMINAL (DRAPES) ×4 IMPLANT
DRSG PAD ABDOMINAL 8X10 ST (GAUZE/BANDAGES/DRESSINGS) ×16 IMPLANT
ELECT BLADE 4.0 EZ CLEAN MEGAD (MISCELLANEOUS) ×4
ELECT REM PT RETURN 9FT ADLT (ELECTROSURGICAL) ×4
ELECTRODE BLDE 4.0 EZ CLN MEGD (MISCELLANEOUS) ×2 IMPLANT
ELECTRODE REM PT RTRN 9FT ADLT (ELECTROSURGICAL) ×2 IMPLANT
EVACUATOR SILICONE 100CC (DRAIN) IMPLANT
EXTRACTOR CANIST REVOLVE STRL (CANNISTER) IMPLANT
FILTER LIPOSUCTION (MISCELLANEOUS) IMPLANT
GLOVE BIO SURGEON STRL SZ 6.5 (GLOVE) ×12 IMPLANT
GLOVE BIO SURGEONS STRL SZ 6.5 (GLOVE) ×4
GLOVE SURG SS PI 7.0 STRL IVOR (GLOVE) ×8 IMPLANT
GOWN STRL REUS W/ TWL LRG LVL3 (GOWN DISPOSABLE) ×6 IMPLANT
GOWN STRL REUS W/TWL LRG LVL3 (GOWN DISPOSABLE) ×6
IMPL BREAST GEL 535CC (Breast) ×4 IMPLANT
IMPLANT BREAST GEL 535CC (Breast) ×8 IMPLANT
IV NS 1000ML (IV SOLUTION)
IV NS 1000ML BAXH (IV SOLUTION) IMPLANT
IV NS 500ML (IV SOLUTION)
IV NS 500ML BAXH (IV SOLUTION) IMPLANT
KIT FILL SYSTEM UNIVERSAL (SET/KITS/TRAYS/PACK) IMPLANT
LINER CANISTER 1000CC FLEX (MISCELLANEOUS) IMPLANT
NDL SAFETY ECLIPSE 18X1.5 (NEEDLE) IMPLANT
NEEDLE HYPO 18GX1.5 SHARP (NEEDLE)
NEEDLE HYPO 25X1 1.5 SAFETY (NEEDLE) IMPLANT
PACK BASIN DAY SURGERY FS (CUSTOM PROCEDURE TRAY) ×4 IMPLANT
PAD ALCOHOL SWAB (MISCELLANEOUS) IMPLANT
PENCIL BUTTON HOLSTER BLD 10FT (ELECTRODE) ×4 IMPLANT
PIN SAFETY STERILE (MISCELLANEOUS) IMPLANT
SIZER BREAST GENERIC (SIZER) ×4 IMPLANT
SIZER BREAST REUSE 535CC (SIZER) ×2
SIZER BRST REUSE ULT HI 535CC (SIZER) ×2 IMPLANT
SLEEVE SCD COMPRESS KNEE MED (MISCELLANEOUS) ×4 IMPLANT
SPONGE GAUZE 4X4 12PLY STER LF (GAUZE/BANDAGES/DRESSINGS) IMPLANT
SPONGE LAP 18X18 X RAY DECT (DISPOSABLE) ×12 IMPLANT
SUT MNCRL AB 4-0 PS2 18 (SUTURE) ×8 IMPLANT
SUT MON AB 3-0 SH 27 (SUTURE) ×4
SUT MON AB 3-0 SH27 (SUTURE) ×4 IMPLANT
SUT MON AB 5-0 PS2 18 (SUTURE) ×8 IMPLANT
SUT PDS AB 2-0 CT2 27 (SUTURE) IMPLANT
SUT VIC AB 3-0 SH 27 (SUTURE)
SUT VIC AB 3-0 SH 27X BRD (SUTURE) IMPLANT
SUT VICRYL 4-0 PS2 18IN ABS (SUTURE) IMPLANT
SYR 20CC LL (SYRINGE) IMPLANT
SYR 3ML 18GX1 1/2 (SYRINGE) IMPLANT
SYR 50ML LL SCALE MARK (SYRINGE) IMPLANT
SYR BULB IRRIGATION 50ML (SYRINGE) ×4 IMPLANT
SYR CONTROL 10ML LL (SYRINGE) IMPLANT
TOWEL OR 17X24 6PK STRL BLUE (TOWEL DISPOSABLE) ×12 IMPLANT
TUBE CONNECTING 20'X1/4 (TUBING) ×1
TUBE CONNECTING 20X1/4 (TUBING) ×3 IMPLANT
TUBING INFILTRATION IT-10001 (TUBING) IMPLANT
TUBING SET GRADUATE ASPIR 12FT (MISCELLANEOUS) IMPLANT
UNDERPAD 30X30 (UNDERPADS AND DIAPERS) ×8 IMPLANT
YANKAUER SUCT BULB TIP NO VENT (SUCTIONS) ×4 IMPLANT

## 2015-08-12 NOTE — Transfer of Care (Signed)
Immediate Anesthesia Transfer of Care Note  Patient: Vicki Hale  Procedure(s) Performed: Procedure(s): REMOVAL OF BILATERAL TISSUE EXPANDERS WITH PLACEMENT OF BILATERAL BREAST IMPLANTS (Bilateral)  Patient Location: PACU  Anesthesia Type:General  Level of Consciousness: sedated  Airway & Oxygen Therapy: Patient Spontanous Breathing and Patient connected to face mask oxygen  Post-op Assessment: Report given to RN and Post -op Vital signs reviewed and stable  Post vital signs: Reviewed and stable  Last Vitals:  Vitals:   08/12/15 0720  BP: (!) 148/77  Pulse: 66  Resp: 20  Temp: 36.5 C    Last Pain:  Vitals:   08/12/15 0720  TempSrc: Oral         Complications: No apparent anesthesia complications

## 2015-08-12 NOTE — Anesthesia Postprocedure Evaluation (Signed)
Anesthesia Post Note  Patient: Vicki Hale  Procedure(s) Performed: Procedure(s) (LRB): REMOVAL OF BILATERAL TISSUE EXPANDERS WITH PLACEMENT OF BILATERAL BREAST IMPLANTS (Bilateral)  Patient location during evaluation: PACU Anesthesia Type: General Level of consciousness: awake and alert Pain management: pain level controlled Vital Signs Assessment: post-procedure vital signs reviewed and stable Respiratory status: spontaneous breathing, nonlabored ventilation, respiratory function stable and patient connected to nasal cannula oxygen Cardiovascular status: blood pressure returned to baseline and stable Postop Assessment: no signs of nausea or vomiting Anesthetic complications: no    Last Vitals:  Vitals:   08/12/15 1030 08/12/15 1058  BP:  (!) 142/82  Pulse: 73 72  Resp: (!) 28 18  Temp:  36.4 C    Last Pain:  Vitals:   08/12/15 1058  TempSrc:   PainSc: 0-No pain                 Vicki Hale

## 2015-08-12 NOTE — Discharge Instructions (Signed)
°  Post Anesthesia Home Care Instructions  Activity: Get plenty of rest for the remainder of the day. A responsible adult should stay with you for 24 hours following the procedure.  For the next 24 hours, DO NOT: -Drive a car -Paediatric nurse -Drink alcoholic beverages -Take any medication unless instructed by your physician -Make any legal decisions or sign important papers.  Meals: Start with liquid foods such as gelatin or soup. Progress to regular foods as tolerated. Avoid greasy, spicy, heavy foods. If nausea and/or vomiting occur, drink only clear liquids until the nausea and/or vomiting subsides. Call your physician if vomiting continues.  Special Instructions/Symptoms: Your throat may feel dry or sore from the anesthesia or the breathing tube placed in your throat during surgery. If this causes discomfort, gargle with warm salt water. The discomfort should disappear within 24 hours.  If you had a scopolamine patch placed behind your ear for the management of post- operative nausea and/or vomiting:  1. The medication in the patch is effective for 72 hours, after which it should be removed.  Wrap patch in a tissue and discard in the trash. Wash hands thoroughly with soap and water. 2. You may remove the patch earlier than 72 hours if you experience unpleasant side effects which may include dry mouth, dizziness or visual disturbances. 3. Avoid touching the patch. Wash your hands with soap and water after contact with the patch.   No heavy lifting    Regional Anesthesia Blocks  1. Numbness or the inability to move the "blocked" extremity may last from 3-48 hours after placement. The length of time depends on the medication injected and your individual response to the medication. If the numbness is not going away after 48 hours, call your surgeon.  2. The extremity that is blocked will need to be protected until the numbness is gone and the  Strength has returned. Because you  cannot feel it, you will need to take extra care to avoid injury. Because it may be weak, you may have difficulty moving it or using it. You may not know what position it is in without looking at it while the block is in effect.  3. For blocks in the legs and feet, returning to weight bearing and walking needs to be done carefully. You will need to wait until the numbness is entirely gone and the strength has returned. You should be able to move your leg and foot normally before you try and bear weight or walk. You will need someone to be with you when you first try to ensure you do not fall and possibly risk injury.  4. Bruising and tenderness at the needle site are common side effects and will resolve in a few days.  5. Persistent numbness or new problems with movement should be communicated to the surgeon or the [place] [phone] [place] [phone]). Continue binder or sports bra

## 2015-08-12 NOTE — Brief Op Note (Signed)
08/12/2015  [id number] AM  PATIENT:  Vicki Hale  65 y.o. female  PRE-OPERATIVE DIAGNOSIS:  LEFT BREAST CANCER  POST-OPERATIVE DIAGNOSIS:  LEFT BREAST CANCER  PROCEDURE:  Procedure(s): REMOVAL OF BILATERAL TISSUE EXPANDERS WITH PLACEMENT OF BILATERAL BREAST IMPLANTS (Bilateral)  SURGEON:  Surgeon(s) and Role:    * Loel Lofty Sparrow [place], DO - Primary  PHYSICIAN ASSISTANT: Shawn Rayburn, PA  ASSISTANTS: none   ANESTHESIA:   local and general  EBL:  Total I/O In: 1000 [I.V.:1000] Out: 25 [Blood:25]  BLOOD ADMINISTERED:none  DRAINS: none   LOCAL MEDICATIONS USED:  MARCAINE     SPECIMEN:  No Specimen  DISPOSITION OF SPECIMEN:  N/A  COUNTS:  YES  TOURNIQUET:  * No tourniquets in log *  DICTATION: .Dragon Dictation  PLAN OF CARE: Discharge to home after PACU  PATIENT DISPOSITION:  PACU - hemodynamically stable.   Delay start of Pharmacological VTE agent (>24hrs) due to surgical blood loss or risk of bleeding: yes

## 2015-08-12 NOTE — Anesthesia Preprocedure Evaluation (Addendum)
Anesthesia Evaluation  Patient identified by MRN, date of birth, ID band Patient awake    Reviewed: Allergy & Precautions, NPO status , Patient's Chart, lab work & pertinent test results  Airway Mallampati: II  TM Distance: >3 FB Neck ROM: Full    Dental no notable dental hx.    Pulmonary neg pulmonary ROS,    Pulmonary exam normal breath sounds clear to auscultation       Cardiovascular negative cardio ROS Normal cardiovascular exam Rhythm:Regular Rate:Normal     Neuro/Psych No certain stroke. She had a series of five headaches during a very stressful period, saw a neurologist, diagnosed as cerebral vasospasm. No recent problems. No deficits. CVA negative psych ROS   GI/Hepatic negative GI ROS, Neg liver ROS,   Endo/Other  negative endocrine ROS  Renal/GU negative Renal ROS  negative genitourinary   Musculoskeletal  (+) Arthritis ,   Abdominal   Peds negative pediatric ROS (+)  Hematology negative hematology ROS (+)   Anesthesia Other Findings   Reproductive/Obstetrics negative OB ROS                           Anesthesia Physical Anesthesia Plan  ASA: II  Anesthesia Plan: General   Post-op Pain Management:    Induction: Intravenous  Airway Management Planned: Oral ETT  Additional Equipment:   Intra-op Plan:   Post-operative Plan: Extubation in OR  Informed Consent: I have reviewed the patients History and Physical, chart, labs and discussed the procedure including the risks, benefits and alternatives for the proposed anesthesia with the patient or authorized representative who has indicated his/her understanding and acceptance.   Dental advisory given  Plan Discussed with: CRNA  Anesthesia Plan Comments:         Anesthesia Quick Evaluation

## 2015-08-12 NOTE — Op Note (Signed)
Op report Bilateral Exchange   DATE OF OPERATION: 08/12/2015  LOCATION: [facility]  SURGICAL DIVISION: Plastic Surgery  PREOPERATIVE DIAGNOSES:  1.History of left breast cancer.  2. Acquired absence of bilateral breast.   POSTOPERATIVE DIAGNOSES:  1. History of left breast cancer.  2. Acquired absence of bilateral breast.   PROCEDURE:  1. Bilateral exchange of tissue expanders for implants.  2. Bilateral capsulotomies for implant respositioning.  SURGEON: Claire Sanger Dillingham, DO  ASSISTANT: Shawn Rayburn, PA  ANESTHESIA:  General.   COMPLICATIONS: None.   IMPLANTS: Left - Mentor Smooth Round Ultra High Profile Gel 535cc. Ref [id number].  Serial Number [id number] - Mentor Smooth Round Ultra High Profile Gel 535cc. Ref [id number].  Serial Number [id number]  INDICATIONS FOR PROCEDURE:  The patient, Vicki Hale, is a 65 y.o. female born on 08-30-1950, is here for treatment after bilateral mastectomies.  She had tissue expanders placed at the time of mastectomies. She now presents for exchange of her expanders for implants.  She requires capsulotomies to better position the implants. MRN: [id number]  CONSENT:  Informed consent was obtained directly from the patient. Risks, benefits and alternatives were fully discussed. Specific risks including but not limited to bleeding, infection, hematoma, seroma, scarring, pain, implant infection, implant extrusion, capsular contracture, asymmetry, wound healing problems, and need for further surgery were all discussed. The patient did have an ample opportunity to have her questions answered to her satisfaction.   DESCRIPTION OF PROCEDURE:  The patient was taken to the operating room. SCDs were placed and IV antibiotics were given. The patient's chest was prepped and draped in a sterile fashion. A time out was performed and the implants to be used were identified.    On the right breast: One percent  Lidocaine with epinephrine was used to infiltrate at the incision site. The old mastectomy scar was excised.  The mastectomy flaps from the superior and inferior flaps were raised over the pectoralis major muscle for several centimeters to minimize tension for the closure. The pectoralis was split inferior to the skin incision to expose and remove the tissue expander.  Inspection of the pocket showed a normal healthy capsule and good integration of the biologic matrix.  The pocket was irrigated with antibiotic solution.  Circumferential capsulotomies were performed to allow for breast pocket expansion.  Measurements were made and a sizer used to confirm adequate pocket size for the implant dimensions.  Hemostasis was ensured with electrocautery. New gloves were placed. The implant was soaked in antibiotic solution and then placed in the pocket and oriented appropriately. The pectoralis major muscle and capsule on the anterior surface were re-closed with a 3-0 Monocryl suture. The remaining skin was closed with 4-0 Monocryl deep dermal and 5-0 Monocryl subcuticular stitches.   On the left breast: The old mastectomy scar was excised.  The mastectomy flaps from the superior and inferior flaps were raised over the pectoralis major muscle for several centimeters to minimize tension for the closure. The pectoralis was split inferior to the skin incision to expose and remove the tissue expander.  Inspection of the pocket showed a normal healthy capsule and good integration of the biologic matrix.   Circumferential capsulotomies were performed to allow for breast pocket expansion.  Measurements were made and a sizer utilized to confirm adequate pocket size for the implant dimensions.  Hemostasis was ensured with the electrocautery.  New gloves were applied. The implant was soaked in antibiotic solution and placed in the pocket and  oriented appropriately. The pectoralis major muscle and capsule on the anterior surface  were re-closed with a 3-0 Monocryl suture. The remaining skin was closed with 4-0 Monocryl deep dermal and 5-0 Monocryl subcuticular stitches.  Dermabond was applied to the incision site. A breast binder and ABDs were placed.  The patient was allowed to wake from anesthesia and taken to the recovery room in satisfactory condition.

## 2015-08-12 NOTE — H&P (Signed)
Vicki Hale is an 65 y.o. female.   Chief Complaint: breast cancer HPI: The patient is a 65 yrs old wf here for follow up after bilateral mastectomies. She underwent immediate reconstruction with expander and FlexHD placement. She is happy with the size and results to date. She is ready for exchange surgery with removal of bilateral breast tissue expanders and placement of bilateral silicone implants with possible lipo-filling.  Expanders 560/350 ml History:  The patient went for a routine mammogram and was found to have calcifications of the LEFT upper outer quadrante. Biopsy revealed invasive ductal carcionoma, Grade 2, ER/PR positive and HER2 negative. She is being seen by Dr. Excell Seltzer. She was treated for ovarian cancer by a hysterectomy and salping-ooopherectomy in 2003. She is 5 feet 7 inches tall, weights 168 pounds, Preop bra = 38C. She had mild Grade II ptosis of her breasts. Both daugters are BRCA positive. The sternal notch to the NAC rightt = 30 cm and left = 28 cm, IMF 9 cm.   Past Medical History:  Diagnosis Date  . Arthritis   . Breast cancer ([place]) 2017   [facility] in [place]  . Family history of breast cancer   . Family history of pancreatic cancer   . FH: BRCA1 gene positive   . Ovarian cancer ([place])   . Pericarditis, acute    30 YRS AGO   . SAH (subarachnoid hemorrhage) ([place])    04/2014  . Stroke [facility])    no deficits    Past Surgical History:  Procedure Laterality Date  . BREAST RECONSTRUCTION WITH PLACEMENT OF TISSUE EXPANDER AND FLEX HD (ACELLULAR HYDRATED DERMIS) Bilateral 03/18/2015   Procedure: BILATERAL BREAST RECONSTRUCTION WITH PLACEMENT OF TISSUE EXPANDER AND FLEX HD (ACELLULAR HYDRATED DERMIS);  Surgeon: Loel Lofty Jemar Paulsen, DO;  Location: [facility];  Service: Plastics;  Laterality: Bilateral;  . CESAREAN SECTION     X2  . HERNIA REPAIR    . MASTECTOMY W/ SENTINEL NODE BIOPSY Left 03/18/2015   Procedure: LEFT TOTAL MASTECTOMY WITH LEFT AXILLARY SENTINEL LYMPH NODE  BIOPSY;  Surgeon: Excell Seltzer, MD;  Location: [facility];  Service: General;  Laterality: Left;  Marland Kitchen MASTECTOMY, PARTIAL Right 03/18/2015   Procedure: RIGHT PROPHYLACTIC MASTECTOMY ;  Surgeon: Excell Seltzer, MD;  Location: [facility];  Service: General;  Laterality: Right;  . TONSILLECTOMY    . TOTAL ABDOMINAL HYSTERECTOMY W/ BILATERAL SALPINGOOPHORECTOMY      Family History  Problem Relation Age of Onset  . Colon polyps Mother   . Breast cancer Daughter 44  . BRCA 1/2 Daughter     BRCA1  . COPD Maternal Aunt   . Esophageal cancer Maternal Uncle   . Lung cancer Paternal Aunt   . Pancreatic cancer Maternal Grandmother     early 47s  . Prostate cancer Maternal Grandfather     74s  . Skin cancer Paternal Grandmother     untreated BCC  . COPD Maternal Aunt   . Lung cancer Maternal Aunt   . Breast cancer Cousin 66    maternal first cousin  . BRCA 1/2 Daughter     BRCA1  . Esophageal cancer Cousin     maternal first cousin  . Bladder Cancer Paternal Aunt   . Colon cancer Neg Hx   . Rectal cancer Neg Hx   . Ulcerative colitis Neg Hx   . Stomach cancer Neg Hx    Social History:  reports that she has never smoked. She has never used smokeless tobacco. She  reports that she drinks alcohol. She reports that she does not use drugs.  Allergies:  Allergies  Allergen Reactions  . Taxotere [Docetaxel] Rash  . Penicillins Itching and Rash    Has patient had a PCN reaction causing immediate rash, facial/tongue/throat swelling, SOB or lightheadedness with hypotension: No Has patient had a PCN reaction causing severe rash involving mucus membranes or skin necrosis: No Has patient had a PCN reaction that required hospitalization No Has patient had a PCN reaction occurring within the last 10 years: No If all of the above answers are "NO", then may proceed with Cephalosporin use.     Medications Prior to Admission  Medication Sig Dispense Refill  . Ascorbic Acid (VITAMIN C) 1000 MG tablet  Take 1,000 mg by mouth 2 (two) times daily.    . B Complex Vitamins (B COMPLEX PO) Take 2 tablets by mouth daily.    Marland Kitchen loratadine (CLARITIN) 10 MG tablet Take 10 mg by mouth daily.    . Multiple Vitamin (MULTIVITAMIN WITH MINERALS) TABS tablet Take 2 tablets by mouth daily.    Marland Kitchen anastrozole (ARIMIDEX) 1 MG tablet Take 1 tablet (1 mg total) by mouth daily. 90 tablet 4  . ibuprofen (ADVIL,MOTRIN) 200 MG tablet Take 400-800 mg by mouth 2 (two) times daily as needed. Reported on 06/03/2015    . LORazepam (ATIVAN) 0.5 MG tablet Take 1 tablet (0.5 mg total) by mouth at bedtime as needed for anxiety. 30 tablet 0  . valACYclovir (VALTREX) 500 MG tablet Take 1 tablet (500 mg total) by mouth daily. 30 tablet 1    No results found for this or any previous visit (from the past 48 hour(s)). No results found.  Review of Systems  Constitutional: Negative.   HENT: Negative.   Eyes: Negative.   Respiratory: Negative.   Cardiovascular: Negative.   Gastrointestinal: Negative.   Genitourinary: Negative.   Musculoskeletal: Negative.   Skin: Negative.   Neurological: Negative.   Endo/Heme/Allergies: Negative.   Psychiatric/Behavioral: Negative.     Blood pressure (!) 148/77, pulse 66, temperature 97.7 F (36.5 C), temperature source Oral, resp. rate 20, height '5\' 7"'  (1.702 m), weight 76.2 kg (168 lb), SpO2 98 %. Physical Exam  Constitutional: She is oriented to person, place, and time. She appears well-developed and well-nourished.  HENT:  Head: Normocephalic and atraumatic.  Eyes: Conjunctivae and EOM are normal. Pupils are equal, round, and reactive to light.  Cardiovascular: Normal rate.   Respiratory: Effort normal. No respiratory distress.  GI: Soft. She exhibits no distension.  Neurological: She is alert and oriented to person, place, and time.  Skin: Skin is warm.  Psychiatric: She has a normal mood and affect. Her behavior is normal. Judgment and thought content normal.      Assessment/Plan Removal of bilateral expanders and placement of implants. The risks that can be encountered with and after placement of a breast expander placement were discussed and include the following but not limited to these: bleeding, infection, delayed healing, anesthesia risks, skin sensation changes, injury to structures including nerves, blood vessels, and muscles which may be temporary or permanent, allergies to tape, suture materials and glues, blood products, topical preparations or injected agents, skin contour irregularities, skin discoloration and swelling, deep vein thrombosis, cardiac and pulmonary complications, pain, which may persist, fluid accumulation, wrinkling of the skin over the implant, changes in nipple or breast sensation, implant leakage or rupture, faulty position of the implant, persistent pain, formation of tight scar tissue around the implant (capsular  contracture), possible need for revisional surgery or staged procedures. The patient desires to proceed and consent was obtained.   [name], DO 08/12/2015, 8:13 AM

## 2015-08-12 NOTE — Anesthesia Procedure Notes (Signed)
Procedure Name: Intubation Date/Time: 08/12/2015 8:37 AM Performed by: Maryella Shivers Pre-anesthesia Checklist: Patient identified, Emergency Drugs available, Suction available and Patient being monitored Patient Re-evaluated:Patient Re-evaluated prior to inductionOxygen Delivery Method: Circle system utilized Preoxygenation: Pre-oxygenation with 100% oxygen Intubation Type: IV induction Ventilation: Mask ventilation without difficulty Laryngoscope Size: Mac and 3 Grade View: Grade II Tube type: Oral Number of attempts: 1 Airway Equipment and Method: Stylet and Oral airway Placement Confirmation: ETT inserted through vocal cords under direct vision,  positive ETCO2 and breath sounds checked- equal and bilateral Secured at: 20 cm Tube secured with: Tape Dental Injury: Teeth and Oropharynx as per pre-operative assessment

## 2015-08-17 ENCOUNTER — Encounter (HOSPITAL_BASED_OUTPATIENT_CLINIC_OR_DEPARTMENT_OTHER): Payer: Self-pay | Admitting: Plastic Surgery

## 2015-08-25 ENCOUNTER — Encounter: Payer: BC Managed Care – PPO | Admitting: Nurse Practitioner

## 2015-09-02 ENCOUNTER — Telehealth: Payer: Self-pay | Admitting: *Deleted

## 2015-09-02 ENCOUNTER — Encounter: Payer: BC Managed Care – PPO | Admitting: Adult Health

## 2015-09-02 NOTE — Progress Notes (Deleted)
[facility]:  Survivorship   REASON FOR VISIT:  Routine follow-up post-treatment for a recent history of breast cancer.  BRIEF ONCOLOGIC HISTORY:    Breast cancer of upper-outer quadrant of left female breast ([place])   06/09/2014 Imaging    DEXA scan ([facility]). Osteopenia     01/28/2015 Initial Biopsy    (L) breast needle biopsy ([facility]): IDC, DCIS, associated calcs, grade 2. ER+ (100%), PR+ (50%), Ki67 10%, HER2 neg (ratio 1.21).      01/28/2015 Initial Diagnosis    Breast cancer of upper-outer quadrant of left female breast ([facility])     02/11/2015 Procedure    BRCA1 mutation, c.213-11T>G.      02/11/2015 Miscellaneous    Other genetics negative.  APC, ATM, BAP1, BARD1, BMPR1A, BRCA1, BRCA2, BRIP1, CDH1, CDK4, CDKN2A (p14ARF), CDKN2A (p16INK4a), CHEK2, EPCAM, GREM1, MITF, MLH1, MSH2, MSH6, MUTYH, NBN, PALB2, PMS2, POLD1, POLE, PTEN, RAD51C, RAD51D, SMAD4, STK11, and TP53     03/18/2015 Surgery    Bilat mastectomies with (L) SLNB ([facility]): LEFT: IDC, grade 2, 1.5 cm, also grade 2 DCIS with comedonecrosis & calcs; margins neg. 0/1 SLN.  Right breast benign, no malignany.     pT1c, pN0: Stage IA     03/18/2015 Oncotype testing    Oncotype DX score: 29; 19% ROR (intermediate-risk)      04/22/2015 - 06/24/2015 Adjuvant Chemotherapy    Taxotere/Cytoxan x 2 cycles; then Taxotere switched to Gemcitabine d/t rash for remaining 2 cycles. ([facility]) .     08/12/2015 Surgery    Breast reconstruction. Placement of bilateral breast implants. ([facility])     07/2015 -  Anti-estrogen oral therapy    Anastrazole 1 mg daily. Planned duration of treatment: 5 years Therapist, nutritional)       INTERVAL HISTORY:  [facility] presents to the [facility] today for our initial meeting to review her survivorship care plan detailing her treatment course for breast cancer, as well as monitoring long-term side effects of that treatment, education regarding health maintenance, screening, and overall wellness and health  promotion.     Overall, [facility] reports feeling quite well since completing her adjuvant chemotherapy approximately 2 months ago.  She ***  REVIEW OF SYSTEMS:   GU: Denies vaginal bleeding, discharge, or dryness.  Breast: Denies any new nodularity, masses, tenderness, nipple changes, or nipple discharge.    A 14-point review of systems was completed and was negative, except as noted above.   ONCOLOGY TREATMENT TEAM:  1. Surgeon:  Dr. Saddie [facility] at [facility] 2. Medical Oncologist: Dr. Jana [facility]  3. Plastic Surgeon: Dr. Audelia [facility]    PAST MEDICAL/SURGICAL HISTORY:  Past Medical History:  Diagnosis Date  . Arthritis   . Breast cancer ([facility]) 2017   [facility]  . Family history of breast cancer   . Family history of pancreatic cancer   . FH: BRCA1 gene positive   . Ovarian cancer ([place])   . Pericarditis, acute    30 YRS AGO   . SAH (subarachnoid hemorrhage) ([facility])    04/2014  . Stroke [facility])    no deficits   Past Surgical History:  Procedure Laterality Date  . BREAST RECONSTRUCTION WITH PLACEMENT OF TISSUE EXPANDER AND FLEX HD (ACELLULAR HYDRATED DERMIS) Bilateral 03/18/2015   Procedure: BILATERAL BREAST RECONSTRUCTION WITH PLACEMENT OF TISSUE EXPANDER AND FLEX HD (ACELLULAR HYDRATED DERMIS);  Surgeon: Vicki Lofty Dillingham, DO;  Location: [facility];  Service: Plastics;  Laterality: Bilateral;  . CESAREAN SECTION     X2  .  HERNIA REPAIR    . MASTECTOMY W/ SENTINEL NODE BIOPSY Left 03/18/2015   Procedure: LEFT TOTAL MASTECTOMY WITH LEFT AXILLARY SENTINEL LYMPH NODE BIOPSY;  Surgeon: Vicki Seltzer, MD;  Location: [facility];  Service: General;  Laterality: Left;  Marland Kitchen MASTECTOMY, PARTIAL Right 03/18/2015   Procedure: RIGHT PROPHYLACTIC MASTECTOMY ;  Surgeon: Vicki Seltzer, MD;  Location: [facility];  Service: General;  Laterality: Right;  . REMOVAL OF BILATERAL TISSUE EXPANDERS WITH PLACEMENT OF BILATERAL BREAST IMPLANTS Bilateral 08/12/2015   Procedure: REMOVAL OF BILATERAL  TISSUE EXPANDERS WITH PLACEMENT OF BILATERAL BREAST IMPLANTS;  Surgeon: Vicki Going, DO;  Location: [facility];  Service: Plastics;  Laterality: Bilateral;  . TONSILLECTOMY    . TOTAL ABDOMINAL HYSTERECTOMY W/ BILATERAL SALPINGOOPHORECTOMY       ALLERGIES:  Allergies  Allergen Reactions  . Taxotere [Docetaxel] Rash  . Penicillins Itching and Rash    Has patient had a PCN reaction causing immediate rash, facial/tongue/throat swelling, SOB or lightheadedness with hypotension: No Has patient had a PCN reaction causing severe rash involving mucus membranes or skin necrosis: No Has patient had a PCN reaction that required hospitalization No Has patient had a PCN reaction occurring within the last 10 years: No If all of the above answers are "NO", then may proceed with Cephalosporin use.      CURRENT MEDICATIONS:  Outpatient Encounter Prescriptions as of 09/02/2015  Medication Sig Note  . anastrozole (ARIMIDEX) 1 MG tablet Take 1 tablet (1 mg total) by mouth daily.   . Ascorbic Acid (VITAMIN C) 1000 MG tablet Take 1,000 mg by mouth 2 (two) times daily.   . B Complex Vitamins (B COMPLEX PO) Take 2 tablets by mouth daily. 03/12/2015: Pt takes gummies at home  . ibuprofen (ADVIL,MOTRIN) 200 MG tablet Take 400-800 mg by mouth 2 (two) times daily as needed. Reported on 06/03/2015   . loratadine (CLARITIN) 10 MG tablet Take 10 mg by mouth daily.   Marland Kitchen LORazepam (ATIVAN) 0.5 MG tablet Take 1 tablet (0.5 mg total) by mouth at bedtime as needed for anxiety.   . Multiple Vitamin (MULTIVITAMIN WITH MINERALS) TABS tablet Take 2 tablets by mouth daily.   . valACYclovir (VALTREX) 500 MG tablet Take 1 tablet (500 mg total) by mouth daily.    No facility-administered encounter medications on file as of 09/02/2015.      ONCOLOGIC FAMILY HISTORY:  Family History  Problem Relation Age of Onset  . Colon polyps Mother   . Breast cancer Daughter 42  . BRCA 1/2 Daughter     BRCA1  .  COPD Maternal Aunt   . Esophageal cancer Maternal Uncle   . Lung cancer Paternal Aunt   . Pancreatic cancer Maternal Grandmother     early 31s  . Prostate cancer Maternal Grandfather     66s  . Skin cancer Paternal Grandmother     untreated BCC  . COPD Maternal Aunt   . Lung cancer Maternal Aunt   . Breast cancer Cousin 31    maternal first cousin  . BRCA 1/2 Daughter     BRCA1  . Esophageal cancer Cousin     maternal first cousin  . Bladder Cancer Paternal Aunt   . Colon cancer Neg Hx   . Rectal cancer Neg Hx   . Ulcerative colitis Neg Hx   . Stomach cancer Neg Hx      GENETIC COUNSELING/TESTING: 02/11/15-BRCA1 mutation, c.213-11T>G.  Other genetics negative.  APC, ATM, BAP1, BARD1, BMPR1A, BRCA1, BRCA2,  BRIP1, CDH1, CDK4, CDKN2A (p14ARF), CDKN2A (p16INK4a), CHEK2, EPCAM, GREM1, MITF, MLH1, MSH2, MSH6, MUTYH, NBN, PALB2, PMS2, POLD1, POLE, PTEN, RAD51C, RAD51D, SMAD4, STK11, and TP53   SOCIAL HISTORY:  [facility] is married and lives with her husband in [place], [place].  Ms. Bardsley is currently retired/disabled/working part-time/full-time as ***.  She denies any current or history of tobacco or illicit drug use.  She drinks alcohol occasionally.      PHYSICAL EXAMINATION:  Vital Signs:  There were no vitals filed for this visit. There were no vitals filed for this visit. General: Well-nourished, well-appearing female in no acute distress.  She is unaccompanied/accompanied in [facility] by her ***** today.   HEENT: Head is normocephalic.  Pupils equal and reactive to light and accomodation. Conjunctivae clear without exudate.  Sclerae anicteric. Oral mucosa is pink, moist.  Oropharynx is pink without lesions or erythema.  Lymph: No cervical, supraclavicular, or infraclavicular lymphadenopathy noted on palpation.  Cardiovascular: Regular rate and rhythm.Marland Kitchen Respiratory: Clear to auscultation bilaterally. Chest expansion symmetric; breathing non-labored.  GI: Abdomen soft and round;  non-tender, non-distended. Bowel sounds normoactive. No hepatosplenomegaly.   GU: Deferred.  Neuro: No focal deficits. Steady gait.  Psych: Mood and affect normal and appropriate for situation.  Extremities: No edema. Skin: Warm and dry.  LABORATORY DATA:  None for this visit.  DIAGNOSTIC IMAGING:  None for this visit.      ASSESSMENT AND PLAN:  Ms.. Hattabaugh is a pleasant 65 y.o. female with Stage IA left breast invasive ductal carcinoma, BRCA1 mutation, ER+/PR+/HER2-, diagnosed in 01/2015; treated with bilateral mastectomies and adjuvant chemotherapy with Taxotere/Cytoxan x 2 cycles, then Gemcitabine/Cytoxan x 2 cycles (Taxotere switched to Gemcitabine for last 2 cycles due to rash), followed by anti-estrogen therapy with Anastrazole beginning in 07/2015.  She presents to the [facility] for our initial meeting and routine follow-up post-completion of treatment for breast cancer.    1. Stage IA left breast cancer; BRCA1 mutation:  Ms. Dulski is continuing to recover from definitive treatment for breast cancer. She will follow-up with her medical oncologist, Dr. Jana [facility] in 09/2015 with history and physical exam per surveillance protocol.  She will continue her anti-estrogen therapy with anastrazole. Thus far, she is tolerating the *** well, with minimal side effects. She was instructed to make Dr. Jana [facility] or myself aware if she begins to experience any worsening side effects of the medication and I could see her back in [facility] to help manage those side effects, as needed. Common side effects of Tamoxifen were again reviewed with her as well. Today, a comprehensive survivorship care plan and treatment summary was reviewed with the patient today detailing her breast cancer diagnosis, treatment course, potential late/long-term effects of treatment, appropriate follow-up care with recommendations for the future, and patient education resources.  A copy of this summary, along with a letter will  be sent to the patient's primary care provider via mail/fax/In Basket message after today's visit.    #. Problem(s) at Visit______________  #. Bone health:  Given Ms. Caba's age, history of breast cancer, and her current treatment regimen including anti-estrogen therapy with anastrazole, she is at risk for bone demineralization.  Her last DEXA scan was 06/09/14 per Dr. Virgie Dad note, which showed osteopenia.  She will likely have repeat DEXA imaging sometime in 2018 per Dr. Jana [facility].  In the meantime, she was encouraged to increase her consumption of foods rich in calcium, as well as increase her weight-bearing activities.  She was given education on  specific activities to promote bone health.  #. Cancer screening:  Due to Ms. Goonan's history and her age, she should receive screening for skin cancers, colon cancer, and gynecologic cancers.  The information and recommendations are listed on the patient's comprehensive care plan/treatment summary and were reviewed in detail with the patient.    #. Health maintenance and wellness promotion: Ms. Cruzen was encouraged to consume 5-7 servings of fruits and vegetables per day. We reviewed the "Nutrition Rainbow" handout, as well as the handout "Recommendations for Nutrition & Physical Activity" from the [place].  She was also encouraged to engage in moderate to vigorous exercise for 30 minutes per day most days of the week. We discussed the [employer] fitness program, which is designed for cancer survivors to help them become more physically fit after cancer treatments.  She was instructed to limit her alcohol consumption and continue to abstain from tobacco use.   #. Support services/counseling: It is not uncommon for this period of the patient's cancer care trajectory to be one of many emotions and stressors.  We discussed an opportunity for her to participate in the next session of [facility] ("Finding Your New Normal") support group  series designed for patients after they have completed treatment.   Ms. Mcmanamon was encouraged to take advantage of our many other support services programs, support groups, and/or counseling in coping with her new life as a cancer survivor after completing anti-cancer treatment.  She was offered support today through active listening and expressive supportive counseling.  She was given information regarding our available services and encouraged to contact me with any questions or for help enrolling in any of our support group/programs.    Dispo:   -Return to cancer center to see Dr. Jana [facility] in 09/2015  -She is welcome to return back to the [facility] at any time; no additional follow-up needed at this time.  -Consider referral back to survivorship as a long-term survivor for continued surveillance  A total of (#) minutes of face-to-face time was spent with this patient with greater than 50% of that time in counseling and care-coordination.   Mike Craze, NP Survivorship [facility] [phone]   Note: PRIMARY CARE PROVIDER Leonides Sake, MD [phone]

## 2015-09-02 NOTE — Telephone Encounter (Signed)
Called pt to inquire abt missed appt for today. No answer but left a detailed VM to her phone to give this nurse a callback [phone], so we can get her rescheduled and I also told her to call back so I can find out if she has started the anastrozole as of yet. Message to be fwd to [employer].

## 2015-09-09 ENCOUNTER — Telehealth: Payer: Self-pay | Admitting: Adult Health

## 2015-09-09 NOTE — Telephone Encounter (Signed)
I received a voicemail from [facility] letting me know that she would like to reschedule her survivorship appt, which she missed last week due to a dental problem that needed intervention.  She states that she called that morning to let us know that she would not be able to make it, but we did not receive that message.  I apologized for any confusion and let her know that I have placed a scheduling request to have one of our schedulers give her a call to get her appt rescheduled.   She voiced understanding and appreciation for my return call.   I will forward this message to [facility], LPN so that she is aware, as she was attempting to reach this patient last week.   Mike Craze, NP [place] [phone]

## 2015-09-18 ENCOUNTER — Telehealth: Payer: Self-pay | Admitting: Adult Health

## 2015-09-18 ENCOUNTER — Encounter: Payer: Self-pay | Admitting: Adult Health

## 2015-09-18 ENCOUNTER — Ambulatory Visit (HOSPITAL_BASED_OUTPATIENT_CLINIC_OR_DEPARTMENT_OTHER): Payer: BC Managed Care – PPO | Admitting: Adult Health

## 2015-09-18 VITALS — BP 140/57 | HR 61 | Temp 98.0°F | Resp 18 | Ht 67.0 in | Wt 169.9 lb

## 2015-09-18 DIAGNOSIS — Z170 Estrogen receptor positive status [ER+]: Secondary | ICD-10-CM | POA: Diagnosis not present

## 2015-09-18 DIAGNOSIS — G629 Polyneuropathy, unspecified: Secondary | ICD-10-CM | POA: Diagnosis not present

## 2015-09-18 DIAGNOSIS — C50412 Malignant neoplasm of upper-outer quadrant of left female breast: Secondary | ICD-10-CM

## 2015-09-18 DIAGNOSIS — Z9013 Acquired absence of bilateral breasts and nipples: Secondary | ICD-10-CM

## 2015-09-18 DIAGNOSIS — Z79811 Long term (current) use of aromatase inhibitors: Secondary | ICD-10-CM

## 2015-09-18 DIAGNOSIS — Z9221 Personal history of antineoplastic chemotherapy: Secondary | ICD-10-CM

## 2015-09-18 NOTE — Progress Notes (Signed)
CLINIC:  Survivorship   REASON FOR VISIT:  Routine follow-up post-treatment for a recent history of breast cancer.  BRIEF ONCOLOGIC HISTORY:    Breast cancer of upper-outer quadrant of left female breast ([facility])   06/09/2014 Imaging    DEXA scan ([facility]). Osteopenia      01/28/2015 Initial Biopsy    (L) breast needle biopsy ([facility]): IDC, DCIS, associated calcs, grade 2. ER+ (100%), PR+ (50%), Ki67 10%, HER2 neg (ratio 1.21).       01/28/2015 Initial Diagnosis    Breast cancer of upper-outer quadrant of left female breast (Vicki Hale)      02/11/2015 Procedure    BRCA1 mutation, c.[id number]>G.       02/11/2015 Miscellaneous    Other genetics negative.  APC, ATM, BAP1, BARD1, BMPR1A, BRCA1, BRCA2, BRIP1, CDH1, CDK4, CDKN2A (p14ARF), CDKN2A (p16INK4a), CHEK2, EPCAM, GREM1, MITF, MLH1, MSH2, MSH6, MUTYH, NBN, PALB2, PMS2, POLD1, POLE, PTEN, RAD51C, RAD51D, SMAD4, STK11, and TP53      03/18/2015 Surgery    Bilat mastectomies with (L) SLNB ([facility]): LEFT: IDC, grade 2, 1.5 cm, also grade 2 DCIS with comedonecrosis & calcs; margins neg. 0/1 SLN.  Right breast benign, no malignany.     pT1c, pN0: Stage IA      03/18/2015 Oncotype testing    Oncotype DX score: 29; 19% ROR (intermediate-risk)       04/22/2015 - 06/24/2015 Adjuvant Chemotherapy    Taxotere/Cytoxan x 2 cycles; then Taxotere switched to Gemcitabine d/t rash for remaining 2 cycles. ([name]) .      08/12/2015 Surgery    Breast reconstruction. Placement of bilateral breast implants. ([facility])      07/2015 -  Anti-estrogen oral therapy    Anastrazole 1 mg daily. Planned duration of treatment: 5 years Therapist, nutritional)        INTERVAL HISTORY:  [name] presents to the [facility] today for our initial meeting to review her survivorship care plan detailing her treatment course for breast cancer, as well as monitoring long-term side effects of that treatment, education regarding health maintenance, screening, and overall  wellness and health promotion.     Overall, [name] reports feeling quite well since completing her adjuvant chemotherapy approximately 2.5 months ago.  She had her breast reconstruction surgery on 08/12/15 and has recovered without any complaints.  She wanted to wait until she had her reconstruction surgery before she started her anti-estrogen therapy, there she started the anastrazole on 08/19/15.  Thus far, she has tolerated it well with minimal complaints; she does endorse some soreness/achiness in her legs that is new since starting the medication. Otherwise, she is tolerating it well.  She does continue to have some occasional peripheral neuropathy in her bilateral feet, which seems to be worse at night when she is trying to go to sleep.  She does not feel like the neuropathy is severe enough to warrant any intervention right now.   She has been on several trips this summer with her grandchildren.  She takes her granddaughters and grandsons on different trips, usually to [employer] [place]. She really enjoys spending time with her family.    REVIEW OF SYSTEMS:  Review of Systems  Constitutional: Negative.   HENT: Negative.   Eyes: Negative.   Respiratory: Negative.   Cardiovascular: Negative.   Gastrointestinal: Negative.   Genitourinary: Negative.   Musculoskeletal: Negative.   Skin: Negative.   Neurological: Positive for tingling.       (+) bilat feet peripheral neuropathy   Endo/Heme/Allergies: Negative.  Psychiatric/Behavioral: Negative.   GU: Denies vaginal bleeding, discharge, or dryness.  Chest: Denies any new nodularity, masses, tenderness.   A 14-point review of systems was completed and was negative, except as noted above.   ONCOLOGY TREATMENT TEAM:  1. Surgeon:  Dr. Saddie Benders at [facility] 2. Medical Oncologist: Dr. Jana Hakim  3. Plastic Surgeon: Dr. Audelia Hives    PAST MEDICAL/SURGICAL HISTORY:  Past Medical History:  Diagnosis Date  . Arthritis     . Breast cancer ([place]) 2017   [facility] in [place]  . Family history of breast cancer   . Family history of pancreatic cancer   . FH: BRCA1 gene positive   . Ovarian cancer ([place])   . Pericarditis, acute    30 YRS AGO   . SAH (subarachnoid hemorrhage) ([place])    04/2014  . Stroke [facility])    no deficits   Past Surgical History:  Procedure Laterality Date  . BREAST RECONSTRUCTION WITH PLACEMENT OF TISSUE EXPANDER AND FLEX HD (ACELLULAR HYDRATED DERMIS) Bilateral 03/18/2015   Procedure: BILATERAL BREAST RECONSTRUCTION WITH PLACEMENT OF TISSUE EXPANDER AND FLEX HD (ACELLULAR HYDRATED DERMIS);  Surgeon: Loel Lofty [place], DO;  Location: [place];  Service: Plastics;  Laterality: Bilateral;  . CESAREAN SECTION     X2  . HERNIA REPAIR    . MASTECTOMY W/ SENTINEL NODE BIOPSY Left 03/18/2015   Procedure: LEFT TOTAL MASTECTOMY WITH LEFT AXILLARY SENTINEL LYMPH NODE BIOPSY;  Surgeon: Excell Seltzer, MD;  Location: [place];  Service: General;  Laterality: Left;  Marland Kitchen MASTECTOMY, PARTIAL Right 03/18/2015   Procedure: RIGHT PROPHYLACTIC MASTECTOMY ;  Surgeon: Excell Seltzer, MD;  Location: [place];  Service: General;  Laterality: Right;  . REMOVAL OF BILATERAL TISSUE EXPANDERS WITH PLACEMENT OF BILATERAL BREAST IMPLANTS Bilateral 08/12/2015   Procedure: REMOVAL OF BILATERAL TISSUE EXPANDERS WITH PLACEMENT OF BILATERAL BREAST IMPLANTS;  Surgeon: [name], DO;  Location: [place];  Service: Plastics;  Laterality: Bilateral;  . TONSILLECTOMY    . TOTAL ABDOMINAL HYSTERECTOMY W/ BILATERAL SALPINGOOPHORECTOMY       ALLERGIES:  Allergies  Allergen Reactions  . Taxotere [Docetaxel] Rash  . Penicillins Itching and Rash    Has patient had a PCN reaction causing immediate rash, facial/tongue/throat swelling, SOB or lightheadedness with hypotension: No Has patient had a PCN reaction causing severe rash involving mucus membranes or skin necrosis: No Has patient had a PCN reaction that required  hospitalization No Has patient had a PCN reaction occurring within the last 10 years: No If all of the above answers are "NO", then may proceed with Cephalosporin use.      CURRENT MEDICATIONS:  Outpatient Encounter Prescriptions as of 09/18/2015  Medication Sig Note  . anastrozole (ARIMIDEX) 1 MG tablet Take 1 tablet (1 mg total) by mouth daily.   . Ascorbic Acid (VITAMIN C) 1000 MG tablet Take 1,000 mg by mouth 2 (two) times daily.   . B Complex Vitamins (B COMPLEX PO) Take 2 tablets by mouth daily. 03/12/2015: Pt takes gummies at home  . ibuprofen (ADVIL,MOTRIN) 200 MG tablet Take 400-800 mg by mouth 2 (two) times daily as needed. Reported on 06/03/2015   . loratadine (CLARITIN) 10 MG tablet Take 10 mg by mouth daily.   Marland Kitchen LORazepam (ATIVAN) 0.5 MG tablet Take 1 tablet (0.5 mg total) by mouth at bedtime as needed for anxiety.   . Multiple Vitamin (MULTIVITAMIN WITH MINERALS) TABS tablet Take 2 tablets by mouth daily.   . valACYclovir (VALTREX) 500  MG tablet Take 1 tablet (500 mg total) by mouth daily.    No facility-administered encounter medications on file as of 09/18/2015.      ONCOLOGIC FAMILY HISTORY:  Family History  Problem Relation Age of Onset  . Colon polyps Mother   . Breast cancer Daughter 58  . BRCA 1/2 Daughter     BRCA1  . COPD Maternal Aunt   . Esophageal cancer Maternal Uncle   . Lung cancer Paternal Aunt   . Pancreatic cancer Maternal Grandmother     early 10s  . Prostate cancer Maternal Grandfather     55s  . Skin cancer Paternal Grandmother     untreated BCC  . COPD Maternal Aunt   . Lung cancer Maternal Aunt   . Breast cancer Cousin 78    maternal first cousin  . BRCA 1/2 Daughter     BRCA1  . Esophageal cancer Cousin     maternal first cousin  . Bladder Cancer Paternal Aunt   . Colon cancer Neg Hx   . Rectal cancer Neg Hx   . Ulcerative colitis Neg Hx   . Stomach cancer Neg Hx      GENETIC COUNSELING/TESTING: 02/11/15-BRCA1 mutation,  c.213-11T>G.  Other genetics negative.  APC, ATM, BAP1, BARD1, BMPR1A, BRCA1, BRCA2, BRIP1, CDH1, CDK4, CDKN2A (p14ARF), CDKN2A (p16INK4a), CHEK2, EPCAM, GREM1, MITF, MLH1, MSH2, MSH6, MUTYH, NBN, PALB2, PMS2, POLD1, POLE, PTEN, RAD51C, RAD51D, SMAD4, STK11, and TP53   SOCIAL HISTORY:  VERNEICE CASPERS is married and lives with her husband in [place], [place]. They have 2 daughters, 1 of whom had breast cancer at the age of 5.  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  She has 4 grandchildren; 2 granddaughters & 2 grandsons.  She denies any current or history of tobacco or illicit drug use.  She drinks alcohol occasionally.      PHYSICAL EXAMINATION:  Vital Signs: Vitals:   09/18/15 1007  BP: (!) 140/57  Pulse: 61  Resp: 18  Temp: 98 F (36.7 C)   Filed Weights   09/18/15 1007  Weight: 169 lb 14.4 oz (77.1 kg)   General: Well-nourished, well-appearing female in no acute distress.  She is unaccompanied today.   HEENT: Head is normocephalic.  Pupils equal and reactive to light and accomodation. Conjunctivae clear without exudate.  Sclerae anicteric. Oral mucosa is pink, moist.  Oropharynx is pink without lesions or erythema.  Lymph: No cervical, supraclavicular, or infraclavicular lymphadenopathy noted on palpation.  Cardiovascular: Regular rate and rhythm.Marland Kitchen Respiratory: Clear to auscultation bilaterally. Chest expansion symmetric; breathing non-labored.  GI: Abdomen soft and round; non-tender, non-distended. Bowel sounds normoactive. GU: Deferred.  Neuro: No focal deficits. Steady gait.  Psych: Mood and affect normal and appropriate for situation.  Extremities: No edema. Skin: Warm and dry.  LABORATORY DATA:  None for this visit.  DIAGNOSTIC IMAGING:  None for this visit.      ASSESSMENT AND PLAN:  Ms.. Vicki Hale is a pleasant 64 y.o. female with Stage IA left breast invasive ductal carcinoma, BRCA1 mutation,  ER+/PR+/HER2-, diagnosed in 01/2015; treated with bilateral mastectomies and adjuvant chemotherapy with Taxotere/Cytoxan x 2 cycles, then Gemcitabine/Cytoxan x 2 cycles (Taxotere switched to Gemcitabine for last 2 cycles due to rash), followed by anti-estrogen therapy with Anastrazole beginning in 07/2015.  She presents to the Survivorship Clinic for our initial meeting and routine follow-up post-completion of treatment for breast cancer.    1.  Stage IA left breast cancer; BRCA1 mutation:  Ms. Sinatra is continuing to recover from definitive treatment for breast cancer. She will follow-up with her medical oncologist, Dr. Jana Hakim in 10/2015 with history and physical exam per surveillance protocol.  (her appointment was originally scheduled for mid-September with Dr. Jana Hakim, but I moved this appointment out 1 more month since she just started the anastrazole on 08/19/15).  She will continue her anti-estrogen therapy with anastrazole. Thus far, she is tolerating the medication well, with minimal side effects. She was instructed to make Dr. Jana Hakim or myself aware if she begins to experience any worsening side effects of the medication and I could see her back in clinic to help manage those side effects, as needed. Today, a comprehensive survivorship care plan and treatment summary was reviewed with the patient today detailing her breast cancer diagnosis, treatment course, potential late/long-term effects of treatment, appropriate follow-up care with recommendations for the future, and patient education resources.  A copy of this summary, along with a letter will be sent to the patient's primary care provider via mail/fax/In Basket message after today's visit.    2. Peripheral neuropathy: Her peripheral neuropathy is likely secondary to her taxane chemotherapy.  We discussed the option of starting single dose gabapentin for her to take only at bedtime, but she does not have to take any additional medications right  now, which is reasonable.  I did let her know that if the neuropathy does not improve or worsened, that the gabapentin would remain a good option for her to try if she chooses.  She remains very physically active, which may also help improve her peripheral neuropathy with time as well.    3. Bone health:  Given Ms. Vanderweele's age, history of breast cancer, and her current treatment regimen including anti-estrogen therapy with anastrazole, she is at risk for bone demineralization.  Her last DEXA scan was 06/09/14 per Dr. Virgie Dad note, which showed osteopenia.  She will likely have repeat DEXA imaging sometime in 2018 per Dr. Jana Hakim.  In the meantime, she was encouraged to increase her consumption of foods rich in calcium, as well as increase her weight-bearing activities.  She was given education on specific activities to promote bone health.  4. Cancer screening:  Due to Ms. Silverio's history and her age, she should receive screening for skin cancers and colon cancer.  The information and recommendations are listed on the patient's comprehensive care plan/treatment summary and were reviewed in detail with the patient.    5. Health maintenance and wellness promotion: Ms. Cammack was encouraged to consume 5-7 servings of fruits and vegetables per day. We reviewed the "Nutrition Rainbow" handout, as well as the handout "Recommendations for Nutrition & Physical Activity" from the [place] [facility].  She was also encouraged to engage in moderate to vigorous exercise for 30 minutes per day most days of the week. We discussed the [employer] fitness program, which is designed for cancer survivors to help them become more physically fit after cancer treatments.  She was instructed to limit her alcohol consumption and continue to abstain from tobacco use.   6. Support services/counseling: It is not uncommon for this period of the patient's cancer care trajectory to be one of many emotions and stressors.   We discussed an opportunity for her to participate in the next session of [facility] ("Finding Your New Normal") support group series designed for patients after they have completed treatment.   Ms. Strege was encouraged to take advantage  of our many other support services programs, support groups, and/or counseling in coping with her new life as a cancer survivor after completing anti-cancer treatment.  She was offered support today through active listening and expressive supportive counseling.  She was given information regarding our available services and encouraged to contact me with any questions or for help enrolling in any of our support group/programs.    Dispo:   -Return to cancer center to see Dr. Jana Hakim in 10/2015  -She is welcome to return back to the Survivorship Clinic at any time; no additional follow-up needed at this time.  -Consider referral back to survivorship as a long-term survivor for continued surveillance  A total of 60 minutes of face-to-face time was spent with this patient with greater than 50% of that time in counseling and care-coordination.   Mike Craze, NP Survivorship Program [place] [phone]   Note: PRIMARY CARE PROVIDER Leonides Sake, MD [phone]

## 2015-09-18 NOTE — Telephone Encounter (Signed)
appt made and avs printed °

## 2015-09-30 ENCOUNTER — Ambulatory Visit: Payer: BC Managed Care – PPO | Admitting: Oncology

## 2015-11-01 NOTE — Progress Notes (Signed)
[place]  [phone] [phone]   ID: [name] DOB: [birth year]  MR#: [id number]  DPO#:[id number]  Patient Care Team: Leonides Sake, MD as PCP - General (Family Medicine) Chauncey Cruel, MD as Consulting Physician (Oncology) Rolm Bookbinder, MD as Consulting Physician (General Surgery) Consuella Lose, MD as Consulting Physician (Neurosurgery) Christene Slates, MD as Physician Assistant (Radiology) Rosalin Hawking, MD as Consulting Physician (Neurology) PCP: Leonides Sake, MD GYN: OTHER MD:  CHIEF COMPLAINT: Breast cancer in BRCA carrier  CURRENT TREATMENT: Anastrozole  CANCER HISTORY: From the original intake note:  Vicki Hale was diagnosed with ovarian cancer in April 2003. She underwent total abdominal hysterectomy and bilateral salpingo-oophorectomy with a full staging operation at [facility] that month and was then treated according to GOG 182, with 4 cycles of carboplatin/topotecan and then 4 cycles of carboplatin/paclitaxel. She was then followed by gynecologic oncology through 2011, and there has been no history of recurrent disease.  On 43/15/4008 Vicki Hale underwent screening mammography at [facility] with tomosynthesis. Breast density was category A. New grouped calcifications were noted in the left breast upper outer quadrant and the patient was recalled for diagnostic unilateral left mammography 01/26/2015. This confirmed a group of new linear calcifications in the upper outer quadrant of the left breast extending over an area of 2.0 cm..   On 01/28/2015 the patient underwent biopsy of her left breast, with the pathology ([id number]) showing an invasive ductal carcinoma, grade 2, estrogen receptor 100% positive, progesterone receptor 50% positive, both with strong staining intensity, with an MIB-1 of 10% and no HER-2 amplification, the signals ratio being 1.21 and the number per cell 2.00.  Her subsequent history is as detailed below  INTERVAL  HISTORY: Vicki Hale returns today for follow-up of her estrogen receptor positive breast cancer. She started anastrozole mid-July 2017. Initially she had problems sleeping, hot flashes, and achiness. She continued on the medication however and she is now sleeping much better without any medication. The achiness has essentially resolved. Her hot flashes have become minimal. She obtains a drug at a very good price.  REVIEW OF SYSTEMS: She still has peripheral neuropathy involving the feet. This is troublesome particularly at night. Otherwise a detailed review of systems today was noncontributory   Past Medical History:  Diagnosis Date  . Arthritis   . Breast cancer ([place]) 2017   [facility] in [place]  . Family history of breast cancer   . Family history of pancreatic cancer   . FH: BRCA1 gene positive   . Ovarian cancer ([place])   . Pericarditis, acute    30 YRS AGO   . SAH (subarachnoid hemorrhage) ([place])    04/2014  . Stroke [facility])    no deficits    PAST SURGICAL HISTORY: Past Surgical History:  Procedure Laterality Date  . BREAST RECONSTRUCTION WITH PLACEMENT OF TISSUE EXPANDER AND FLEX HD (ACELLULAR HYDRATED DERMIS) Bilateral 03/18/2015   Procedure: BILATERAL BREAST RECONSTRUCTION WITH PLACEMENT OF TISSUE EXPANDER AND FLEX HD (ACELLULAR HYDRATED DERMIS);  Surgeon: Loel Lofty Dillingham, DO;  Location: [place];  Service: Plastics;  Laterality: Bilateral;  . CESAREAN SECTION     X2  . HERNIA REPAIR    . MASTECTOMY W/ SENTINEL NODE BIOPSY Left 03/18/2015   Procedure: LEFT TOTAL MASTECTOMY WITH LEFT AXILLARY SENTINEL LYMPH NODE BIOPSY;  Surgeon: Excell Seltzer, MD;  Location: [place];  Service: General;  Laterality: Left;  Marland Kitchen MASTECTOMY, PARTIAL Right 03/18/2015   Procedure: RIGHT PROPHYLACTIC MASTECTOMY ;  Surgeon: Excell Seltzer, MD;  [facility];  Service: General;  Laterality: Right;  . REMOVAL OF BILATERAL TISSUE EXPANDERS WITH PLACEMENT OF BILATERAL BREAST IMPLANTS Bilateral 08/12/2015   Procedure:  REMOVAL OF BILATERAL TISSUE EXPANDERS WITH PLACEMENT OF BILATERAL BREAST IMPLANTS;  Surgeon: [name], DO;  Location: [place];  Service: Plastics;  Laterality: Bilateral;  . TONSILLECTOMY    . TOTAL ABDOMINAL HYSTERECTOMY W/ BILATERAL SALPINGOOPHORECTOMY      FAMILY HISTORY Family History  Problem Relation Age of Onset  . Colon polyps Mother   . Breast cancer Daughter 50  . BRCA 1/2 Daughter     BRCA1  . COPD Maternal Aunt   . Esophageal cancer Maternal Uncle   . Lung cancer Paternal Aunt   . Pancreatic cancer Maternal Grandmother     early 71s  . Prostate cancer Maternal Grandfather     11s  . Skin cancer Paternal Grandmother     untreated BCC  . COPD Maternal Aunt   . Lung cancer Maternal Aunt   . Breast cancer Cousin 26    maternal first cousin  . BRCA 1/2 Daughter     BRCA1  . Esophageal cancer Cousin     maternal first cousin  . Bladder Cancer Paternal Aunt   . Colon cancer Neg Hx   . Rectal cancer Neg Hx   . Ulcerative colitis Neg Hx   . Stomach cancer Neg Hx    the patient's 2 daughters have been tested for the BRCA gene and both are positive. One of them had breast cancer at the age of 64. In addition on the maternal side there is pancreatic prostate breast and esophageal cancer. The patient's father died at the age of 76 and a car accident and there is very little information on his side of the family. The patient's mother died at 37 from COPD. She had had a hysterectomy in her 1s. The patient's mother had 5 siblings several from died young. The patient herself had no sisters, 2 brothers.  GYNECOLOGIC HISTORY:  No LMP recorded. Patient has had a hysterectomy.  Menarche age 9, first live birth age 80, the patient is Vicki Hale P2. She underwent TAH/BSO in 2003 for her ovarian cancer. She took hormone replacement for approximately 6 months. She was on oral contraceptives for approximately 4 years remotely, with no complications.  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 Her husband Shanon Brow works for a [employer]. Daughter Roxie Kreeger lives in [place] where she works as a school principal. She is 88 years old. Daughter Artia Singley lives in [place] and is a Pharmacist, hospital. She is 64 years old. These ages are as of January 2017. The patient has 4 grandchildren. She attends a [employer]    ADVANCED DIRECTIVES: Not in place   HEALTH MAINTENANCE: Social History  Substance Use Topics  . Smoking status: Never Smoker  . Smokeless tobacco: Never Used  . Alcohol use 0.0 oz/week     Comment: ocassional; once monthly     Colonoscopy: September 2016/ [facility]  PAP:  Bone density: 2016  Lipid panel:  Allergies  Allergen Reactions  . Taxotere [Docetaxel] Rash  . Penicillins Itching and Rash    Has patient had a PCN reaction causing immediate rash, facial/tongue/throat swelling, SOB or lightheadedness with hypotension: No Has patient had a PCN reaction causing severe rash involving mucus membranes or skin necrosis: No Has patient  had a PCN reaction that required hospitalization No Has patient had a PCN reaction occurring within the last 10 years: No If all of the above answers are "NO", then may proceed with Cephalosporin use.     Current Outpatient Prescriptions  Medication Sig Dispense Refill  . Vitamin D, Cholecalciferol, 1000 units CAPS Take by mouth.    Marland Kitchen anastrozole (ARIMIDEX) 1 MG tablet Take 1 tablet (1 mg total) by mouth daily. 90 tablet 4  . Ascorbic Acid (VITAMIN C) 1000 MG tablet Take 1,000 mg by mouth daily.     . B Complex Vitamins (B COMPLEX PO) Take 2 tablets by mouth daily.    Marland Kitchen ibuprofen (ADVIL,MOTRIN) 200 MG tablet Take 400-800 mg by mouth 2 (two) times daily as needed. Reported on 06/03/2015    . loratadine (CLARITIN) 10 MG tablet Take 10 mg by mouth daily.    . Multiple Vitamin (MULTIVITAMIN WITH MINERALS) TABS tablet Take 2  tablets by mouth daily.     No current facility-administered medications for this visit.     OBJECTIVE: Middle-aged white woman Who appears well Vitals:   11/02/15 1118  BP: (!) 137/48  Pulse: (!) 57  Resp: 18  Temp: 97.6 F (36.4 C)     Body mass index is 26.91 kg/m.    ECOG FS:0 - Asymptomatic  Sclerae unicteric, EOMs intact Oropharynx clear and moist No cervical or supraclavicular adenopathy Lungs no rales or rhonchi Heart regular rate and rhythm Abd soft, nontender, positive bowel sounds MSK no focal spinal tenderness, no upper extremity lymphedema Neuro: nonfocal, well oriented, appropriate affect Breasts: Status post bilateral mastectomies with implants in place. The cosmetic result is good. There is no evidence of local recurrence. Both axillae are benign.     LAB RESULTS:  CMP     Component Value Date/Time   NA 142 07/29/2015 0950   K 4.2 07/29/2015 0950   CL 104 03/18/2015 1653   CO2 25 07/29/2015 0950   GLUCOSE 89 07/29/2015 0950   BUN 18.0 07/29/2015 0950   CREATININE 0.8 07/29/2015 0950   CALCIUM 9.4 07/29/2015 0950   PROT 6.9 07/29/2015 0950   ALBUMIN 3.7 07/29/2015 0950   AST 21 07/29/2015 0950   ALT 18 07/29/2015 0950   ALKPHOS 91 07/29/2015 0950   BILITOT 0.34 07/29/2015 0950   GFRNONAA >60 03/18/2015 1653   GFRAA >60 03/18/2015 1653    INo results found for: SPEP, UPEP  Lab Results  Component Value Date   WBC 4.8 07/29/2015   NEUTROABS 3.3 07/29/2015   HGB 12.5 07/29/2015   HCT 37.7 07/29/2015   MCV 92.2 07/29/2015   PLT 397 07/29/2015      Chemistry      Component Value Date/Time   NA 142 07/29/2015 0950   K 4.2 07/29/2015 0950   CL 104 03/18/2015 1653   CO2 25 07/29/2015 0950   BUN 18.0 07/29/2015 0950   CREATININE 0.8 07/29/2015 0950      Component Value Date/Time   CALCIUM 9.4 07/29/2015 0950   ALKPHOS 91 07/29/2015 0950   AST 21 07/29/2015 0950   ALT 18 07/29/2015 0950   BILITOT 0.34 07/29/2015 0950      No  results found for: LABCA2  No components found for: [id number]  No results for input(s): INR in the last 168 hours.  Urinalysis No results found for: COLORURINE, APPEARANCEUR, LABSPEC, PHURINE, GLUCOSEU, HGBUR, BILIRUBINUR, KETONESUR, PROTEINUR, UROBILINOGEN, NITRITE, LEUKOCYTESUR  STUDIES: No results found.  ASSESSMENT: 65 y.o. BRCA1 positive  Merlyn Albert, [place] woman  (1) ovarian cancer stage IIIB diagnosed April 2003  (a) s/p supracervical hysterectomy and BSO with surgical staging April 2003  (b) treated according to GOG 182: carboplatin/ topotecan x4 followed by carboplatin/taxol x4  (c) last chemotherapy dose October 2003  (2) status post left breast upper outer quadrant biopsy 01/28/2015 for an invasive ductal carcinoma, grade 2, estrogen and progesterone receptor positive, HER-2 not amplified, with an MIB-1 of 10%  (a) tamoxifen started neoadjuvantly due to concerns regarding surgical delay, interrupted with chemotherapy  (3) status post bilateral mastectomies with left axillary lymph node sampling 03/18/2015 showing  (a) on the right, benign disease  (b) on the left, a pT1c pN0, stage IA invasive ductal carcinoma, repeat HER-2 again negative, stage IA    (4) Oncotype DX score of 29, in the high intermediate range, predicts a risk of recurrence with tamoxifen alone of 19%. The addition of chemotherapy in this setting is predicted to decrease distant disease-free recurrence by approximately 6%.   (5) adjuvant chemotherapy with cyclophosphamide and docetaxel started 04/22/2015, repeated every 21 days 4  (a) gemcitabine switched for docetaxel for cycles 3 and 4 due to rash.  (b) chemotherapy completed 06/24/2015  (6) adjuvant anastrozole started 07/29/2015  (a) bone density at [facility] 06/09/2014 shows a T score of -2.4; this is increased from prior  PLAN: Vicki Hale gets extra credit for pushing through the initial discomfort with a rim index and getting to a more stable place. She is now  tolerating it well. I suspect she will have little difficulty completing 5 years on this medication.  We do have to worry about bone density issues. She will be due for repeat bone density May 2018 and I have entered those orders. If there has been a significant loss, we will consider bisphosphonates or denosumab.   Today I encouraged her to start vitamin D 1000 units daily. She is already on an excellent walking program. I also gave her information on [facility].  Finally I made her aware of our intimacy and pelvic health program in case she wishes to participate.  She will be seeing her surgeon some time in 3 months or so. She will see me again in late May or early June of next year, after her next bone density.  She knows to call for any problems that may develop before that visit.  Chauncey Cruel, MD   11/02/2015 11:51 AM

## 2015-11-02 ENCOUNTER — Ambulatory Visit (HOSPITAL_BASED_OUTPATIENT_CLINIC_OR_DEPARTMENT_OTHER): Payer: BC Managed Care – PPO | Admitting: Oncology

## 2015-11-02 VITALS — BP 137/48 | HR 57 | Temp 97.6°F | Resp 18 | Ht 67.0 in | Wt 171.8 lb

## 2015-11-02 DIAGNOSIS — C50412 Malignant neoplasm of upper-outer quadrant of left female breast: Secondary | ICD-10-CM

## 2015-11-02 DIAGNOSIS — Z170 Estrogen receptor positive status [ER+]: Secondary | ICD-10-CM | POA: Diagnosis not present

## 2015-11-02 DIAGNOSIS — G622 Polyneuropathy due to other toxic agents: Secondary | ICD-10-CM | POA: Diagnosis not present

## 2015-11-02 DIAGNOSIS — Z1509 Genetic susceptibility to other malignant neoplasm: Principal | ICD-10-CM

## 2015-11-02 DIAGNOSIS — Z8543 Personal history of malignant neoplasm of ovary: Secondary | ICD-10-CM | POA: Diagnosis not present

## 2015-11-02 DIAGNOSIS — C569 Malignant neoplasm of unspecified ovary: Secondary | ICD-10-CM

## 2015-11-02 DIAGNOSIS — Z1501 Genetic susceptibility to malignant neoplasm of breast: Principal | ICD-10-CM

## 2015-11-25 ENCOUNTER — Encounter (HOSPITAL_BASED_OUTPATIENT_CLINIC_OR_DEPARTMENT_OTHER): Payer: Self-pay | Admitting: *Deleted

## 2015-11-29 ENCOUNTER — Ambulatory Visit: Payer: Self-pay | Admitting: Plastic Surgery

## 2015-11-29 ENCOUNTER — Other Ambulatory Visit: Payer: Self-pay | Admitting: Plastic Surgery

## 2015-11-29 DIAGNOSIS — C50919 Malignant neoplasm of unspecified site of unspecified female breast: Secondary | ICD-10-CM

## 2015-11-29 DIAGNOSIS — N651 Disproportion of reconstructed breast: Secondary | ICD-10-CM

## 2015-11-30 NOTE — H&P (Signed)
Vicki Hale is an 65 y.o. female.   Chief Complaint: breast asymmetry HPI: Vicki Hale a 65 yo female who is seen for pre operative history and physical prior to revision of bilateral breast reconstruction (08/12/15) with plans for bilateral upper breast lipo-filling.  She is well known to the practice because her daughter was treated for BRCA positive disease and bilateral breast reconstruction. The patient went for a routine mammogram and was found to have calcifications of the LEFT upper outer quadrante. Biopsy revealed invasive ductal carcionoma, Grade 2, ER/PR positive and HER2 negative. She is being seen by Dr. Excell Seltzer. She was treated for ovarian cancer by a hysterectomy and salping-ooopherectomy in 2003. She is 5 feet 7 inches tall, weights 168 pounds, Preop bra = 38C. She had mild Grade II ptosis of her breasts. Both daugters are BRCA positive. The sternal notch to the NAC rightt = 30 cm and left = 28 cm, IMF 9 cm.  Past Medical History:  Diagnosis Date  . Arthritis   . Breast cancer ([place]) 2017   [facility] in [place]  . Family history of breast cancer   . Family history of pancreatic cancer   . FH: BRCA1 gene positive   . Ovarian cancer ([place])   . Pericarditis, acute    30 YRS AGO   . SAH (subarachnoid hemorrhage) ([place])    04/2014  . Stroke [facility])    no deficits    Past Surgical History:  Procedure Laterality Date  . BREAST RECONSTRUCTION WITH PLACEMENT OF TISSUE EXPANDER AND FLEX HD (ACELLULAR HYDRATED DERMIS) Bilateral 03/18/2015   Procedure: BILATERAL BREAST RECONSTRUCTION WITH PLACEMENT OF TISSUE EXPANDER AND FLEX HD (ACELLULAR HYDRATED DERMIS);  Surgeon: Loel Lofty Dillingham, DO;  Location: [place];  Service: Plastics;  Laterality: Bilateral;  . CESAREAN SECTION     X2  . HERNIA REPAIR    . MASTECTOMY W/ SENTINEL NODE BIOPSY Left 03/18/2015   Procedure: LEFT TOTAL MASTECTOMY WITH LEFT AXILLARY SENTINEL LYMPH NODE BIOPSY;  Surgeon: Excell Seltzer, MD;  Location: [place];  Service:  General;  Laterality: Left;  Marland Kitchen MASTECTOMY, PARTIAL Right 03/18/2015   Procedure: RIGHT PROPHYLACTIC MASTECTOMY ;  Surgeon: Excell Seltzer, MD;  Location: [place];  Service: General;  Laterality: Right;  . REMOVAL OF BILATERAL TISSUE EXPANDERS WITH PLACEMENT OF BILATERAL BREAST IMPLANTS Bilateral 08/12/2015   Procedure: REMOVAL OF BILATERAL TISSUE EXPANDERS WITH PLACEMENT OF BILATERAL BREAST IMPLANTS;  Surgeon: [name], DO;  Location: [place];  Service: Plastics;  Laterality: Bilateral;  . TONSILLECTOMY    . TOTAL ABDOMINAL HYSTERECTOMY W/ BILATERAL SALPINGOOPHORECTOMY      Family History  Problem Relation Age of Onset  . Colon polyps Mother   . Breast cancer Daughter 72  . BRCA 1/2 Daughter     BRCA1  . COPD Maternal Aunt   . Esophageal cancer Maternal Uncle   . Lung cancer Paternal Aunt   . Pancreatic cancer Maternal Grandmother     early 96s  . Prostate cancer Maternal Grandfather     34s  . Skin cancer Paternal Grandmother     untreated BCC  . COPD Maternal Aunt   . Lung cancer Maternal Aunt   . Breast cancer Cousin 78    maternal first cousin  . BRCA 1/2 Daughter     BRCA1  . Esophageal cancer Cousin     maternal first cousin  . Bladder Cancer Paternal Aunt   . Colon cancer Neg Hx   . Rectal cancer  Neg Hx   . Ulcerative colitis Neg Hx   . Stomach cancer Neg Hx    Social History:  reports that she has never smoked. She has never used smokeless tobacco. She reports that she drinks alcohol. She reports that she does not use drugs.  Allergies:  Allergies  Allergen Reactions  . Taxotere [Docetaxel] Rash  . Penicillins Itching and Rash    Has patient had a PCN reaction causing immediate rash, facial/tongue/throat swelling, SOB or lightheadedness with hypotension: No Has patient had a PCN reaction causing severe rash involving mucus membranes or skin necrosis: No Has patient had a PCN reaction that required hospitalization No Has patient had a  PCN reaction occurring within the last 10 years: No If all of the above answers are "NO", then may proceed with Cephalosporin use.     No prescriptions prior to admission.    No results found for this or any previous visit (from the past 48 hour(s)). No results found.  Review of Systems  Constitutional: Negative.   HENT: Negative.   Eyes: Negative.   Respiratory: Negative.   Cardiovascular: Negative.   Gastrointestinal: Negative.   Genitourinary: Negative.   Musculoskeletal: Negative.   Skin: Negative.   Neurological: Negative.   Psychiatric/Behavioral: Negative.     Height 5' 7" (1.702 m), weight 74.8 kg (165 lb). Physical Exam  Constitutional: She is oriented to person, place, and time. She appears well-developed and well-nourished.  HENT:  Head: Normocephalic and atraumatic.  Eyes: EOM are normal. Pupils are equal, round, and reactive to light.  Cardiovascular: Normal rate.   Respiratory: Effort normal. No respiratory distress.  GI: Soft. She exhibits no distension. There is no tenderness.  Neurological: She is alert and oriented to person, place, and time.  Skin: Skin is warm.  Psychiatric: She has a normal mood and affect. Her behavior is normal. Judgment and thought content normal.     Assessment/Plan Breast asymmetry - plan for lipofilling for correction of breast asymmetry.  [name], DO 11/30/2015, 7:06 AM

## 2015-12-02 ENCOUNTER — Ambulatory Visit (HOSPITAL_BASED_OUTPATIENT_CLINIC_OR_DEPARTMENT_OTHER): Payer: BC Managed Care – PPO | Admitting: Anesthesiology

## 2015-12-02 ENCOUNTER — Encounter (HOSPITAL_BASED_OUTPATIENT_CLINIC_OR_DEPARTMENT_OTHER)
Admission: RE | Disposition: A | Discharge status: Home / Self Care | Payer: Self-pay | Source: Ambulatory Visit | Attending: Plastic Surgery

## 2015-12-02 ENCOUNTER — Encounter (HOSPITAL_BASED_OUTPATIENT_CLINIC_OR_DEPARTMENT_OTHER): Payer: Self-pay | Admitting: Anesthesiology

## 2015-12-02 ENCOUNTER — Ambulatory Visit (HOSPITAL_BASED_OUTPATIENT_CLINIC_OR_DEPARTMENT_OTHER)
Admission: RE | Admit: 2015-12-02 | Discharge: 2015-12-02 | Disposition: A | Discharge status: Home / Self Care | Payer: BC Managed Care – PPO | Source: Ambulatory Visit | Attending: Plastic Surgery | Admitting: Plastic Surgery

## 2015-12-02 DIAGNOSIS — Z90710 Acquired absence of both cervix and uterus: Secondary | ICD-10-CM | POA: Insufficient documentation

## 2015-12-02 DIAGNOSIS — T8544XA Capsular contracture of breast implant, initial encounter: Secondary | ICD-10-CM | POA: Diagnosis not present

## 2015-12-02 DIAGNOSIS — Z9013 Acquired absence of bilateral breasts and nipples: Secondary | ICD-10-CM | POA: Insufficient documentation

## 2015-12-02 DIAGNOSIS — L905 Scar conditions and fibrosis of skin: Secondary | ICD-10-CM | POA: Diagnosis not present

## 2015-12-02 DIAGNOSIS — Z853 Personal history of malignant neoplasm of breast: Secondary | ICD-10-CM | POA: Insufficient documentation

## 2015-12-02 DIAGNOSIS — M1990 Unspecified osteoarthritis, unspecified site: Secondary | ICD-10-CM | POA: Insufficient documentation

## 2015-12-02 DIAGNOSIS — C50919 Malignant neoplasm of unspecified site of unspecified female breast: Secondary | ICD-10-CM

## 2015-12-02 DIAGNOSIS — Z8673 Personal history of transient ischemic attack (TIA), and cerebral infarction without residual deficits: Secondary | ICD-10-CM | POA: Diagnosis not present

## 2015-12-02 DIAGNOSIS — N6489 Other specified disorders of breast: Secondary | ICD-10-CM | POA: Insufficient documentation

## 2015-12-02 DIAGNOSIS — Z880 Allergy status to penicillin: Secondary | ICD-10-CM | POA: Diagnosis not present

## 2015-12-02 DIAGNOSIS — N651 Disproportion of reconstructed breast: Secondary | ICD-10-CM

## 2015-12-02 DIAGNOSIS — Z1501 Genetic susceptibility to malignant neoplasm of breast: Secondary | ICD-10-CM | POA: Diagnosis not present

## 2015-12-02 HISTORY — PX: LIPOSUCTION WITH LIPOFILLING: SHX6436

## 2015-12-02 SURGERY — LIPOSUCTION, WITH FAT TRANSFER
Anesthesia: General | Site: Breast | Laterality: Bilateral

## 2015-12-02 MED ORDER — SODIUM CHLORIDE 0.9 % IJ SOLN
INTRAMUSCULAR | Status: AC
  Filled 2015-12-02: qty 30

## 2015-12-02 MED ORDER — FENTANYL CITRATE (PF) 100 MCG/2ML IJ SOLN
50.0000 ug | INTRAMUSCULAR | Status: DC | PRN
  Administered 2015-12-02: 100 ug via INTRAVENOUS
  Administered 2015-12-02: 50 ug via INTRAVENOUS

## 2015-12-02 MED ORDER — CIPROFLOXACIN IN D5W 400 MG/200ML IV SOLN
400.0000 mg | INTRAVENOUS | Status: AC
  Administered 2015-12-02: 400 mg via INTRAVENOUS

## 2015-12-02 MED ORDER — EPINEPHRINE 30 MG/30ML IJ SOLN
INTRAMUSCULAR | Status: AC
  Filled 2015-12-02: qty 1

## 2015-12-02 MED ORDER — MIDAZOLAM HCL 2 MG/2ML IJ SOLN
1.0000 mg | INTRAMUSCULAR | Status: DC | PRN
  Administered 2015-12-02: 2 mg via INTRAVENOUS

## 2015-12-02 MED ORDER — LIDOCAINE-EPINEPHRINE 1 %-1:100000 IJ SOLN
INTRAMUSCULAR | Status: DC | PRN
  Administered 2015-12-02: 3 mL

## 2015-12-02 MED ORDER — DEXAMETHASONE SODIUM PHOSPHATE 4 MG/ML IJ SOLN
INTRAMUSCULAR | Status: DC | PRN
  Administered 2015-12-02: 10 mg via INTRAVENOUS

## 2015-12-02 MED ORDER — ONDANSETRON HCL 4 MG/2ML IJ SOLN
INTRAMUSCULAR | Status: AC
  Filled 2015-12-02: qty 2

## 2015-12-02 MED ORDER — BUPIVACAINE HCL (PF) 0.25 % IJ SOLN
INTRAMUSCULAR | Status: AC
Start: 2015-12-02 — End: 2015-12-02
  Filled 2015-12-02: qty 30

## 2015-12-02 MED ORDER — PROPOFOL 10 MG/ML IV BOLUS
INTRAVENOUS | Status: DC | PRN
  Administered 2015-12-02: 150 mg via INTRAVENOUS

## 2015-12-02 MED ORDER — OXYCODONE HCL 5 MG PO TABS: 5.0000 mg | ORAL_TABLET | Freq: Once | ORAL | Status: DC | PRN

## 2015-12-02 MED ORDER — LIDOCAINE HCL (PF) 1 % IJ SOLN
INTRAMUSCULAR | Status: AC
  Filled 2015-12-02: qty 60

## 2015-12-02 MED ORDER — MIDAZOLAM HCL 2 MG/2ML IJ SOLN
INTRAMUSCULAR | Status: AC
  Filled 2015-12-02: qty 2

## 2015-12-02 MED ORDER — CHLORHEXIDINE GLUCONATE CLOTH 2 % EX PADS: 6.0000 | MEDICATED_PAD | Freq: Once | CUTANEOUS | Status: DC

## 2015-12-02 MED ORDER — FENTANYL CITRATE (PF) 100 MCG/2ML IJ SOLN
INTRAMUSCULAR | Status: AC
  Filled 2015-12-02: qty 2

## 2015-12-02 MED ORDER — HYDROCODONE-ACETAMINOPHEN 5-325 MG PO TABS: 1.0000 | ORAL_TABLET | Freq: Four times a day (QID) | ORAL | 0 refills | Status: DC | PRN

## 2015-12-02 MED ORDER — OXYCODONE HCL 5 MG/5ML PO SOLN: 5.0000 mg | Freq: Once | ORAL | Status: DC | PRN

## 2015-12-02 MED ORDER — ROCURONIUM BROMIDE 100 MG/10ML IV SOLN
INTRAVENOUS | Status: DC | PRN
  Administered 2015-12-02: 40 mg via INTRAVENOUS

## 2015-12-02 MED ORDER — ROCURONIUM BROMIDE 10 MG/ML (PF) SYRINGE
PREFILLED_SYRINGE | INTRAVENOUS | Status: AC
  Filled 2015-12-02: qty 10

## 2015-12-02 MED ORDER — HYDROMORPHONE HCL 1 MG/ML IJ SOLN: 0.2500 mg | INTRAMUSCULAR | Status: DC | PRN

## 2015-12-02 MED ORDER — ONDANSETRON HCL 4 MG/2ML IJ SOLN
INTRAMUSCULAR | Status: DC | PRN
  Administered 2015-12-02: 4 mg via INTRAVENOUS

## 2015-12-02 MED ORDER — CIPROFLOXACIN IN D5W 400 MG/200ML IV SOLN
INTRAVENOUS | Status: AC
  Filled 2015-12-02: qty 200

## 2015-12-02 MED ORDER — PROPOFOL 500 MG/50ML IV EMUL
INTRAVENOUS | Status: AC
  Filled 2015-12-02: qty 50

## 2015-12-02 MED ORDER — LIDOCAINE-EPINEPHRINE 1 %-1:100000 IJ SOLN
INTRAMUSCULAR | Status: AC
  Filled 2015-12-02: qty 1

## 2015-12-02 MED ORDER — LACTATED RINGERS IV SOLN
INTRAVENOUS | Status: DC
  Administered 2015-12-02 (×2): via INTRAVENOUS

## 2015-12-02 MED ORDER — DEXAMETHASONE SODIUM PHOSPHATE 10 MG/ML IJ SOLN
INTRAMUSCULAR | Status: AC
  Filled 2015-12-02: qty 1

## 2015-12-02 MED ORDER — ONDANSETRON HCL 4 MG/2ML IJ SOLN: 4.0000 mg | Freq: Four times a day (QID) | INTRAMUSCULAR | Status: DC | PRN

## 2015-12-02 MED ORDER — LIDOCAINE HCL 1 % IJ SOLN
INTRAVENOUS | Status: DC | PRN
  Administered 2015-12-02: 800 mL

## 2015-12-02 MED ORDER — SCOPOLAMINE 1 MG/3DAYS TD PT72: 1.0000 | MEDICATED_PATCH | Freq: Once | TRANSDERMAL | Status: DC | PRN

## 2015-12-02 MED ORDER — EPHEDRINE SULFATE 50 MG/ML IJ SOLN
INTRAMUSCULAR | Status: DC | PRN
  Administered 2015-12-02 (×2): 10 mg via INTRAVENOUS

## 2015-12-02 MED ORDER — SUGAMMADEX SODIUM 200 MG/2ML IV SOLN
INTRAVENOUS | Status: DC | PRN
  Administered 2015-12-02: 200 mg via INTRAVENOUS

## 2015-12-02 SURGICAL SUPPLY — 82 items
BAG DECANTER FOR FLEXI CONT (MISCELLANEOUS) IMPLANT
BINDER ABDOMINAL  9 SM 30-45 (SOFTGOODS)
BINDER ABDOMINAL 10 UNV 27-48 (MISCELLANEOUS) ×4 IMPLANT
BINDER ABDOMINAL 12 SM 30-45 (SOFTGOODS) IMPLANT
BINDER ABDOMINAL 9 SM 30-45 (SOFTGOODS) IMPLANT
BINDER BREAST LRG (GAUZE/BANDAGES/DRESSINGS) IMPLANT
BINDER BREAST MEDIUM (GAUZE/BANDAGES/DRESSINGS) IMPLANT
BINDER BREAST XLRG (GAUZE/BANDAGES/DRESSINGS) ×4 IMPLANT
BINDER BREAST XXLRG (GAUZE/BANDAGES/DRESSINGS) IMPLANT
BIOPATCH RED 1 DISK 7.0 (GAUZE/BANDAGES/DRESSINGS) IMPLANT
BIOPATCH RED 1IN DISK 7.0MM (GAUZE/BANDAGES/DRESSINGS)
BLADE HEX COATED 2.75 (ELECTRODE) ×4 IMPLANT
BLADE SURG 15 STRL LF DISP TIS (BLADE) ×2 IMPLANT
BLADE SURG 15 STRL SS (BLADE) ×2
BNDG GAUZE ELAST 4 BULKY (GAUZE/BANDAGES/DRESSINGS) IMPLANT
CANISTER SUCT 1200ML W/VALVE (MISCELLANEOUS) ×4 IMPLANT
CHLORAPREP W/TINT 26ML (MISCELLANEOUS) ×8 IMPLANT
COVER BACK TABLE 60X90IN (DRAPES) ×4 IMPLANT
COVER MAYO STAND STRL (DRAPES) ×4 IMPLANT
DECANTER SPIKE VIAL GLASS SM (MISCELLANEOUS) IMPLANT
DERMABOND ADVANCED (GAUZE/BANDAGES/DRESSINGS) ×2
DERMABOND ADVANCED .7 DNX12 (GAUZE/BANDAGES/DRESSINGS) ×2 IMPLANT
DRAIN CHANNEL 19F RND (DRAIN) IMPLANT
DRAPE LAPAROSCOPIC ABDOMINAL (DRAPES) ×4 IMPLANT
DRSG PAD ABDOMINAL 8X10 ST (GAUZE/BANDAGES/DRESSINGS) ×8 IMPLANT
ELECT BLADE 4.0 EZ CLEAN MEGAD (MISCELLANEOUS)
ELECT BLADE 6.5 .24CM SHAFT (ELECTRODE) IMPLANT
ELECT REM PT RETURN 9FT ADLT (ELECTROSURGICAL) ×4
ELECTRODE BLDE 4.0 EZ CLN MEGD (MISCELLANEOUS) IMPLANT
ELECTRODE REM PT RTRN 9FT ADLT (ELECTROSURGICAL) ×2 IMPLANT
EVACUATOR SILICONE 100CC (DRAIN) IMPLANT
EXTRACTOR CANIST REVOLVE STRL (CANNISTER) ×4 IMPLANT
FILTER LIPOSUCTION (MISCELLANEOUS) ×4 IMPLANT
GLOVE BIO SURGEON STRL SZ 6.5 (GLOVE) ×9 IMPLANT
GLOVE BIO SURGEONS STRL SZ 6.5 (GLOVE) ×3
GLOVE BIOGEL PI IND STRL 7.0 (GLOVE) ×2 IMPLANT
GLOVE BIOGEL PI INDICATOR 7.0 (GLOVE) ×2
GLOVE ECLIPSE 6.5 STRL STRAW (GLOVE) ×8 IMPLANT
GLOVE SURG SS PI 7.0 STRL IVOR (GLOVE) ×4 IMPLANT
GOWN STRL REUS W/ TWL LRG LVL3 (GOWN DISPOSABLE) ×8 IMPLANT
GOWN STRL REUS W/TWL LRG LVL3 (GOWN DISPOSABLE) ×8
IV LACTATED RINGERS 1000ML (IV SOLUTION) ×12 IMPLANT
IV NS 500ML (IV SOLUTION)
IV NS 500ML BAXH (IV SOLUTION) IMPLANT
KIT FILL SYSTEM UNIVERSAL (SET/KITS/TRAYS/PACK) IMPLANT
LINER CANISTER 1000CC FLEX (MISCELLANEOUS) ×12 IMPLANT
NDL SAFETY ECLIPSE 18X1.5 (NEEDLE) ×4 IMPLANT
NEEDLE HYPO 18GX1.5 SHARP (NEEDLE) ×4
NEEDLE HYPO 25X1 1.5 SAFETY (NEEDLE) ×4 IMPLANT
NS IRRIG 1000ML POUR BTL (IV SOLUTION) IMPLANT
PACK BASIN DAY SURGERY FS (CUSTOM PROCEDURE TRAY) ×4 IMPLANT
PAD ALCOHOL SWAB (MISCELLANEOUS) ×4 IMPLANT
PENCIL BUTTON HOLSTER BLD 10FT (ELECTRODE) ×4 IMPLANT
PIN SAFETY STERILE (MISCELLANEOUS) IMPLANT
SLEEVE SCD COMPRESS KNEE MED (MISCELLANEOUS) ×4 IMPLANT
SPONGE GAUZE 4X4 12PLY STER LF (GAUZE/BANDAGES/DRESSINGS) IMPLANT
SPONGE LAP 18X18 X RAY DECT (DISPOSABLE) ×8 IMPLANT
SUT MNCRL AB 4-0 PS2 18 (SUTURE) IMPLANT
SUT MON AB 3-0 SH 27 (SUTURE)
SUT MON AB 3-0 SH27 (SUTURE) IMPLANT
SUT MON AB 5-0 PS2 18 (SUTURE) ×4 IMPLANT
SUT PDS 3-0 CT2 (SUTURE)
SUT PDS AB 2-0 CT2 27 (SUTURE) IMPLANT
SUT PDS II 3-0 CT2 27 ABS (SUTURE) IMPLANT
SUT SILK 3 0 PS 1 (SUTURE) IMPLANT
SUT VIC AB 3-0 SH 27 (SUTURE)
SUT VIC AB 3-0 SH 27X BRD (SUTURE) IMPLANT
SUT VICRYL 4-0 PS2 18IN ABS (SUTURE) IMPLANT
SYR 10ML LL (SYRINGE) ×16 IMPLANT
SYR 20CC LL (SYRINGE) IMPLANT
SYR 3ML 18GX1 1/2 (SYRINGE) IMPLANT
SYR 50ML LL SCALE MARK (SYRINGE) IMPLANT
SYR BULB IRRIGATION 50ML (SYRINGE) ×4 IMPLANT
SYR CONTROL 10ML LL (SYRINGE) ×4 IMPLANT
SYR TOOMEY 50ML (SYRINGE) ×8 IMPLANT
TOWEL OR 17X24 6PK STRL BLUE (TOWEL DISPOSABLE) ×8 IMPLANT
TUBE CONNECTING 20'X1/4 (TUBING) ×1
TUBE CONNECTING 20X1/4 (TUBING) ×3 IMPLANT
TUBING INFILTRATION IT-10001 (TUBING) ×4 IMPLANT
TUBING SET GRADUATE ASPIR 12FT (MISCELLANEOUS) ×4 IMPLANT
UNDERPAD 30X30 (UNDERPADS AND DIAPERS) ×8 IMPLANT
YANKAUER SUCT BULB TIP NO VENT (SUCTIONS) ×4 IMPLANT

## 2015-12-02 NOTE — Discharge Instructions (Signed)
°  Post Anesthesia Home Care Instructions  Activity: Get plenty of rest for the remainder of the day. A responsible adult should stay with you for 24 hours following the procedure.  For the next 24 hours, DO NOT: -Drive a car -Paediatric nurse -Drink alcoholic beverages -Take any medication unless instructed by your physician -Make any legal decisions or sign important papers.  Meals: Start with liquid foods such as gelatin or soup. Progress to regular foods as tolerated. Avoid greasy, spicy, heavy foods. If nausea and/or vomiting occur, drink only clear liquids until the nausea and/or vomiting subsides. Call your physician if vomiting continues.  Special Instructions/Symptoms: Your throat may feel dry or sore from the anesthesia or the breathing tube placed in your throat during surgery. If this causes discomfort, gargle with warm salt water. The discomfort should disappear within 24 hours.  If you had a scopolamine patch placed behind your ear for the management of post- operative nausea and/or vomiting:  1. The medication in the patch is effective for 72 hours, after which it should be removed.  Wrap patch in a tissue and discard in the trash. Wash hands thoroughly with soap and water. 2. You may remove the patch earlier than 72 hours if you experience unpleasant side effects which may include dry mouth, dizziness or visual disturbances. 3. Avoid touching the patch. Wash your hands with soap and water after contact with the patch.       Shower tomorrow Continue binders No heavy lifting

## 2015-12-02 NOTE — Anesthesia Preprocedure Evaluation (Signed)
Anesthesia Evaluation  Patient identified by MRN, date of birth, ID band Patient awake    Reviewed: Allergy & Precautions, H&P , NPO status , Patient's Chart, lab work & pertinent test results  Airway Mallampati: II   Neck ROM: full    Dental   Pulmonary neg pulmonary ROS,    breath sounds clear to auscultation       Cardiovascular negative cardio ROS   Rhythm:regular Rate:Normal     Neuro/Psych CVA    GI/Hepatic   Endo/Other    Renal/GU      Musculoskeletal  (+) Arthritis ,   Abdominal   Peds  Hematology   Anesthesia Other Findings   Reproductive/Obstetrics Breast CA. Ovarian CA.                             Anesthesia Physical Anesthesia Plan  ASA: II  Anesthesia Plan: General   Post-op Pain Management:    Induction: Intravenous  Airway Management Planned: Oral ETT  Additional Equipment:   Intra-op Plan:   Post-operative Plan: Extubation in OR  Informed Consent: I have reviewed the patients History and Physical, chart, labs and discussed the procedure including the risks, benefits and alternatives for the proposed anesthesia with the patient or authorized representative who has indicated his/her understanding and acceptance.     Plan Discussed with: CRNA, Anesthesiologist and Surgeon  Anesthesia Plan Comments:         Anesthesia Quick Evaluation

## 2015-12-02 NOTE — Brief Op Note (Signed)
12/02/2015  [id number] AM  PATIENT:  Vicki Hale  65 y.o. female  PRE-OPERATIVE DIAGNOSIS:  status post breast reconstruction and left breast cancer  POST-OPERATIVE DIAGNOSIS:  status post breast reconstruction and left breast cancer  PROCEDURE:  Procedure(s): LIPOSUCTION WITH LIPOFILLING FROM ABDOMEN TO BILATERAL BREAST (Bilateral)  SURGEON:  Surgeon(s) and Role:    * Loel Lofty Dillingham, DO - Primary  PHYSICIAN ASSISTANT: Shawn Rayburn, PA  ASSISTANTS: none   ANESTHESIA:   general  EBL:  Total I/O In: 1400 [I.V.:1400] Out: -   BLOOD ADMINISTERED:none  DRAINS: none   LOCAL MEDICATIONS USED:  LIDOCAINE   SPECIMEN:  No Specimen  DISPOSITION OF SPECIMEN:  N/A  COUNTS:  YES  TOURNIQUET:  * No tourniquets in log *  DICTATION: .Dragon Dictation  PLAN OF CARE: Discharge to home after PACU  PATIENT DISPOSITION:  PACU - hemodynamically stable.   Delay start of Pharmacological VTE agent (>24hrs) due to surgical blood loss or risk of bleeding: no

## 2015-12-02 NOTE — Interval H&P Note (Signed)
History and Physical Interval Note:  [id number] AM  Vicki Hale  has presented today for surgery, with the diagnosis of status post breast recontruction and left breast cancer  The various methods of treatment have been discussed with the patient and family. After consideration of risks, benefits and other options for treatment, the patient has consented to  Procedure(s): BILATERAL BREAST RECONSTRUCTION REVISION (Bilateral) LIPOSUCTION WITH LIPOFILLING (Bilateral) as a surgical intervention .  The patient's history has been reviewed, patient examined, no change in status, stable for surgery.  I have reviewed the patient's chart and labs.  Questions were answered to the patient's satisfaction.     [name]

## 2015-12-02 NOTE — Op Note (Signed)
Op report   DATE OF OPERATION: 12/02/2015  LOCATION: [place]  SURGICAL DIVISION: Plastic Surgery  PREOPERATIVE DIAGNOSES:  1. Bilateral Breast asymmetry after reconstruction. 2. Release of bilateral scar contracture of breast capsule. 3. History of breast cancer.  4. Acquired absence of bilateral breast.   POSTOPERATIVE DIAGNOSES:  1. Bilateral Breast asymmetry after reconstruction. 2. Release of bilateral scar contracture of breast capsule. 3. History of breast cancer.  4. Acquired absence of bilateral breast.   PROCEDURE:  1. Bilateral release of scar contracture. 2. Lipofilling for breast asymmetry.  SURGEON: Ab Leaming Sanger Kendre Jacinto, DO  ASSISTANT: Shawn Rayburn, PA  ANESTHESIA:  General.   COMPLICATIONS: None.   INDICATIONS FOR PROCEDURE:  The patient, Vicki Hale, is a 65 y.o. female born on 03/31/1950, is here for treatment after bilateral mastectomies.  She had tissue expanders placed at the time of mastectomies. This was followed by implant placement.  She had some capsule contracture medial superior area of both breasts with breast asymmetry after the reconstruction.   MRN: [id number]  CONSENT:  Informed consent was obtained directly from the patient. Risks, benefits and alternatives were fully discussed. Specific risks including but not limited to bleeding, infection, hematoma, seroma, scarring, pain, implant infection, implant extrusion, capsular contracture, asymmetry, wound healing problems, and need for further surgery were all discussed. She is aware that not all the fat will survive.  The patient did have an ample opportunity to have her questions answered to her satisfaction.   DESCRIPTION OF PROCEDURE:  The patient was taken to the operating room. SCDs were placed and IV antibiotics were given. The patient's chest was prepped and draped in a sterile fashion. A time out was performed and the implants to be used were identified.     Tumescent was placed in the abdominal fat.  To give time for the local to take effect, attention was turned to the breast.  One percent Lidocaine with epinephrine was used to infiltrate at the incision site on the breast. A 1 cm incision was made at the previous incision site laterally and medially on both breasts.  The #15 blade was used to make an incision.  The lipo cannula was inserted to break up the scar in the superior medial aspect of each breast.  Additionally the lateral aspect of the right breast at the axilla was released with the cannula as well (100 cm2 area).  Liposuction was then performed on the abdomen and the fat prepared with the Revolve device.  The fat was injected in the right lateral and superior medial aspect for a total of 150 cc.  The left breast superior medial aspect was injected for a total of 80 cc.  The patient had good symmetry.  The lateral inferior most portion of both breasts were liposuctioned for better contour.  The skin incisions were closed with 5-0 Monocryl.  Dermabond was applied to the incision site. A breast binder and ABDs were placed.  The patient was allowed to wake from anesthesia and taken to the recovery room in satisfactory condition.

## 2015-12-02 NOTE — Anesthesia Postprocedure Evaluation (Signed)
Anesthesia Post Note  Patient: LAKEILA GRUNDEN  Procedure(s) Performed: Procedure(s) (LRB): LIPOSUCTION WITH LIPOFILLING FROM ABDOMEN TO BILATERAL BREAST (Bilateral)  Patient location during evaluation: PACU Anesthesia Type: General Level of consciousness: awake and alert and patient cooperative Pain management: pain level controlled Vital Signs Assessment: post-procedure vital signs reviewed and stable Respiratory status: spontaneous breathing and respiratory function stable Cardiovascular status: stable Anesthetic complications: no    Last Vitals:  Vitals:   12/02/15 1100 12/02/15 1115  BP: 129/66 134/66  Pulse: 76 74  Resp: 13 (!) 9  Temp:      Last Pain:  Vitals:   12/02/15 1115  TempSrc:   PainSc: 0-No pain                 Shauntelle Jamerson S

## 2015-12-02 NOTE — Anesthesia Procedure Notes (Signed)
Procedure Name: Intubation Date/Time: 12/02/2015 9:24 AM Performed by: Maryella Shivers Pre-anesthesia Checklist: Patient identified, Emergency Drugs available, Suction available and Patient being monitored Patient Re-evaluated:Patient Re-evaluated prior to inductionOxygen Delivery Method: Circle system utilized Preoxygenation: Pre-oxygenation with 100% oxygen Intubation Type: IV induction Ventilation: Mask ventilation without difficulty Laryngoscope Size: Mac and 3 Grade View: Grade II Tube type: Oral Tube size: 7.0 mm Number of attempts: 1 Airway Equipment and Method: Stylet and Oral airway Placement Confirmation: ETT inserted through vocal cords under direct vision,  positive ETCO2 and breath sounds checked- equal and bilateral Tube secured with: Tape Dental Injury: Teeth and Oropharynx as per pre-operative assessment

## 2015-12-02 NOTE — Transfer of Care (Signed)
Immediate Anesthesia Transfer of Care Note  Patient: Vicki Hale  Procedure(s) Performed: Procedure(s): LIPOSUCTION WITH LIPOFILLING FROM ABDOMEN TO BILATERAL BREAST (Bilateral)  Patient Location: PACU  Anesthesia Type:General  Level of Consciousness: awake, alert  and oriented  Airway & Oxygen Therapy: Patient Spontanous Breathing and Patient connected to face mask oxygen  Post-op Assessment: Report given to RN and Post -op Vital signs reviewed and stable  Post vital signs: Reviewed and stable  Last Vitals:  Vitals:   12/02/15 0722  BP: 124/65  Pulse: 63  Resp: 18  Temp: 36.6 C    Last Pain:  Vitals:   12/02/15 0722  TempSrc: Oral         Complications: No apparent anesthesia complications

## 2015-12-03 ENCOUNTER — Encounter (HOSPITAL_BASED_OUTPATIENT_CLINIC_OR_DEPARTMENT_OTHER): Payer: Self-pay | Admitting: Plastic Surgery

## 2016-03-09 ENCOUNTER — Encounter (HOSPITAL_COMMUNITY): Payer: Self-pay

## 2016-03-16 ENCOUNTER — Encounter (HOSPITAL_BASED_OUTPATIENT_CLINIC_OR_DEPARTMENT_OTHER): Payer: Self-pay | Admitting: *Deleted

## 2016-03-21 ENCOUNTER — Ambulatory Visit: Payer: Self-pay | Admitting: Plastic Surgery

## 2016-03-21 DIAGNOSIS — N651 Disproportion of reconstructed breast: Secondary | ICD-10-CM

## 2016-03-21 NOTE — H&P (Signed)
Vicki Hale is an 66 y.o. female.   Chief Complaint: breast asymmetry HPI:The patient is a 66 yrs old wf here for breast reconstruction surgery.   She has not had any additional redness or swelling in the left breast.  She has mild loss of fat in the right upper breast.  This may need further lipofilling.  She would like better symmetry History: The patient went for a routine mammogram and was found to have calcifications of the LEFT upper outer quadrante. Biopsy revealed invasive ductal carcionoma, Grade 2, ER/PR positive and HER2 negative. She is being seen by Dr. Excell Seltzer. She was treated for ovarian cancer by a hysterectomy and salping-ooopherectomy in 2003. She is 5 feet 7 inches tall, weights 168 pounds, Preop bra = 38C. She had mild Grade II ptosis of her breasts. Both daugters are BRCA positive. The sternal notch to the NAC rightt = 30 cm and left = 28 cm, IMF 9 cm.   Past Medical History:  Diagnosis Date  . Arthritis   . Breast cancer ([place]) 2017   [facility] in [place]  . Family history of breast cancer   . Family history of pancreatic cancer   . FH: BRCA1 gene positive   . Ovarian cancer ([place])   . Pericarditis, acute    30 YRS AGO   . SAH (subarachnoid hemorrhage) ([place])    04/2014  . Stroke [facility])    no deficits    Past Surgical History:  Procedure Laterality Date  . BREAST RECONSTRUCTION WITH PLACEMENT OF TISSUE EXPANDER AND FLEX HD (ACELLULAR HYDRATED DERMIS) Bilateral 03/18/2015   Procedure: BILATERAL BREAST RECONSTRUCTION WITH PLACEMENT OF TISSUE EXPANDER AND FLEX HD (ACELLULAR HYDRATED DERMIS);  Surgeon: Loel Lofty Constance Whittle, DO;  Location: [place];  Service: Plastics;  Laterality: Bilateral;  . CESAREAN SECTION     X2  . HERNIA REPAIR    . LIPOSUCTION WITH LIPOFILLING Bilateral 12/02/2015   Procedure: LIPOSUCTION WITH LIPOFILLING FROM ABDOMEN TO BILATERAL BREAST;  Surgeon: [name], DO;  Location: [place];  Service: Plastics;  Laterality: Bilateral;   . MASTECTOMY W/ SENTINEL NODE BIOPSY Left 03/18/2015   Procedure: LEFT TOTAL MASTECTOMY WITH LEFT AXILLARY SENTINEL LYMPH NODE BIOPSY;  Surgeon: Excell Seltzer, MD;  Location: [place];  Service: General;  Laterality: Left;  Marland Kitchen MASTECTOMY, PARTIAL Right 03/18/2015   Procedure: RIGHT PROPHYLACTIC MASTECTOMY ;  Surgeon: Excell Seltzer, MD;  Location: [place];  Service: General;  Laterality: Right;  . REMOVAL OF BILATERAL TISSUE EXPANDERS WITH PLACEMENT OF BILATERAL BREAST IMPLANTS Bilateral 08/12/2015   Procedure: REMOVAL OF BILATERAL TISSUE EXPANDERS WITH PLACEMENT OF BILATERAL BREAST IMPLANTS;  Surgeon: [name], DO;  Location: [place];  Service: Plastics;  Laterality: Bilateral;  . TONSILLECTOMY    . TOTAL ABDOMINAL HYSTERECTOMY W/ BILATERAL SALPINGOOPHORECTOMY      Family History  Problem Relation Age of Onset  . Colon polyps Mother   . Breast cancer Daughter 75  . BRCA 1/2 Daughter     BRCA1  . COPD Maternal Aunt   . Esophageal cancer Maternal Uncle   . Lung cancer Paternal Aunt   . Pancreatic cancer Maternal Grandmother     early 48s  . Prostate cancer Maternal Grandfather     27s  . Skin cancer Paternal Grandmother     untreated BCC  . COPD Maternal Aunt   . Lung cancer Maternal Aunt   . Breast cancer Cousin 24    maternal first cousin  .  BRCA 1/2 Daughter     BRCA1  . Esophageal cancer Cousin     maternal first cousin  . Bladder Cancer Paternal Aunt   . Colon cancer Neg Hx   . Rectal cancer Neg Hx   . Ulcerative colitis Neg Hx   . Stomach cancer Neg Hx    Social History:  reports that she has never smoked. She has never used smokeless tobacco. She reports that she drinks alcohol. She reports that she does not use drugs.  Allergies:  Allergies  Allergen Reactions  . Taxotere [Docetaxel] Rash  . Penicillins Itching and Rash    Has patient had a PCN reaction causing immediate rash, facial/tongue/throat swelling, SOB or lightheadedness with  hypotension: No Has patient had a PCN reaction causing severe rash involving mucus membranes or skin necrosis: No Has patient had a PCN reaction that required hospitalization No Has patient had a PCN reaction occurring within the last 10 years: No If all of the above answers are "NO", then may proceed with Cephalosporin use.      (Not in a hospital admission)  No results found for this or any previous visit (from the past 48 hour(s)). No results found.  Review of Systems  Constitutional: Negative.   HENT: Negative.   Eyes: Negative.   Respiratory: Negative.   Cardiovascular: Negative.   Gastrointestinal: Negative.   Genitourinary: Negative.   Musculoskeletal: Negative.   Skin: Negative.   Neurological: Negative.   Psychiatric/Behavioral: Negative.     There were no vitals taken for this visit. Physical Exam  Constitutional: She is oriented to person, place, and time. She appears well-developed and well-nourished.  HENT:  Head: Normocephalic and atraumatic.  Eyes: EOM are normal. Pupils are equal, round, and reactive to light.  Cardiovascular: Normal rate.   Respiratory: Effort normal. No respiratory distress.  GI: Soft. She exhibits no distension. There is no tenderness.  Musculoskeletal: She exhibits no edema.  Neurological: She is alert and oriented to person, place, and time.  Skin: Skin is warm. No rash noted. No erythema.  Psychiatric: She has a normal mood and affect. Her behavior is normal. Judgment and thought content normal.     Assessment/Plan  Bilateral lipofilling of breasts:  The risks that can be encountered with and after liposuction with fat grafting to the breasts were discussed and include the following but no limited to these:  Asymmetry, fluid accumulation, firmness of the area, fat necrosis with death of fat tissue, bleeding, infection, delayed healing, anesthesia risks, skin sensation changes, injury to structures including nerves, blood vessels,  and muscles which may be temporary or permanent, allergies to tape, suture materials and glues, blood products, topical preparations or injected agents, skin and contour irregularities, skin discoloration and swelling, deep vein thrombosis, cardiac and pulmonary complications, pain, which may persist, persistent pain, recurrence of the lesion, poor healing of the incision, possible need for revisional surgery or staged procedures. There can also be persistent swelling, poor wound healing, rippling or loose skin, worsening of cellulite,and swelling. Any change in weight fluctuations can alter the outcome.  [name], DO 03/21/2016, 11:08 AM

## 2016-03-23 ENCOUNTER — Encounter (HOSPITAL_BASED_OUTPATIENT_CLINIC_OR_DEPARTMENT_OTHER)
Admission: RE | Disposition: A | Discharge status: Home / Self Care | Payer: Self-pay | Source: Ambulatory Visit | Attending: Plastic Surgery

## 2016-03-23 ENCOUNTER — Ambulatory Visit (HOSPITAL_BASED_OUTPATIENT_CLINIC_OR_DEPARTMENT_OTHER)
Admission: RE | Admit: 2016-03-23 | Discharge: 2016-03-23 | Disposition: A | Discharge status: Home / Self Care | Payer: BC Managed Care – PPO | Source: Ambulatory Visit | Attending: Plastic Surgery | Admitting: Plastic Surgery

## 2016-03-23 ENCOUNTER — Encounter (HOSPITAL_BASED_OUTPATIENT_CLINIC_OR_DEPARTMENT_OTHER): Payer: Self-pay | Admitting: Anesthesiology

## 2016-03-23 ENCOUNTER — Ambulatory Visit (HOSPITAL_BASED_OUTPATIENT_CLINIC_OR_DEPARTMENT_OTHER): Payer: BC Managed Care – PPO | Admitting: Anesthesiology

## 2016-03-23 DIAGNOSIS — Z853 Personal history of malignant neoplasm of breast: Secondary | ICD-10-CM | POA: Insufficient documentation

## 2016-03-23 DIAGNOSIS — Z8543 Personal history of malignant neoplasm of ovary: Secondary | ICD-10-CM | POA: Insufficient documentation

## 2016-03-23 DIAGNOSIS — Z9012 Acquired absence of left breast and nipple: Secondary | ICD-10-CM | POA: Diagnosis not present

## 2016-03-23 DIAGNOSIS — Z8673 Personal history of transient ischemic attack (TIA), and cerebral infarction without residual deficits: Secondary | ICD-10-CM | POA: Diagnosis not present

## 2016-03-23 DIAGNOSIS — Z803 Family history of malignant neoplasm of breast: Secondary | ICD-10-CM | POA: Diagnosis not present

## 2016-03-23 DIAGNOSIS — N651 Disproportion of reconstructed breast: Secondary | ICD-10-CM | POA: Diagnosis not present

## 2016-03-23 DIAGNOSIS — K219 Gastro-esophageal reflux disease without esophagitis: Secondary | ICD-10-CM | POA: Insufficient documentation

## 2016-03-23 DIAGNOSIS — Z880 Allergy status to penicillin: Secondary | ICD-10-CM | POA: Diagnosis not present

## 2016-03-23 HISTORY — PX: LIPOSUCTION WITH LIPOFILLING: SHX6436

## 2016-03-23 SURGERY — LIPOSUCTION, WITH FAT TRANSFER
Anesthesia: General | Site: Breast | Laterality: Bilateral

## 2016-03-23 MED ORDER — LIDOCAINE-EPINEPHRINE (PF) 1 %-1:200000 IJ SOLN
INTRAMUSCULAR | Status: AC
  Filled 2016-03-23: qty 30

## 2016-03-23 MED ORDER — CLOBETASOL PROPIONATE 0.05 % EX OINT: 1.0000 "application " | TOPICAL_OINTMENT | Freq: Two times a day (BID) | CUTANEOUS | 0 refills | Status: DC

## 2016-03-23 MED ORDER — CLOBETASOL PROPIONATE 0.05 % EX OINT
TOPICAL_OINTMENT | CUTANEOUS | Status: DC | PRN
  Administered 2016-03-23: 1 via TOPICAL

## 2016-03-23 MED ORDER — ONDANSETRON HCL 4 MG/2ML IJ SOLN
INTRAMUSCULAR | Status: AC
  Filled 2016-03-23: qty 2

## 2016-03-23 MED ORDER — SODIUM CHLORIDE 0.9% FLUSH: 3.0000 mL | INTRAVENOUS | Status: DC | PRN

## 2016-03-23 MED ORDER — LIDOCAINE 2% (20 MG/ML) 5 ML SYRINGE: INTRAMUSCULAR | Status: DC | PRN

## 2016-03-23 MED ORDER — OXYCODONE HCL 5 MG PO TABS: 5.0000 mg | ORAL_TABLET | ORAL | Status: DC | PRN

## 2016-03-23 MED ORDER — PROPOFOL 10 MG/ML IV BOLUS
INTRAVENOUS | Status: AC
  Filled 2016-03-23: qty 40

## 2016-03-23 MED ORDER — SODIUM BICARBONATE 4 % IV SOLN
INTRAVENOUS | Status: AC
  Filled 2016-03-23: qty 5

## 2016-03-23 MED ORDER — LACTATED RINGERS IV SOLN
INTRAVENOUS | Status: DC | PRN
  Administered 2016-03-23: 1000 mL via INTRAVENOUS

## 2016-03-23 MED ORDER — FENTANYL CITRATE (PF) 100 MCG/2ML IJ SOLN
INTRAMUSCULAR | Status: AC
  Filled 2016-03-23: qty 4

## 2016-03-23 MED ORDER — SCOPOLAMINE 1 MG/3DAYS TD PT72: 1.0000 | MEDICATED_PATCH | Freq: Once | TRANSDERMAL | Status: DC | PRN

## 2016-03-23 MED ORDER — LACTATED RINGERS IV SOLN
INTRAVENOUS | Status: DC
  Administered 2016-03-23 (×3): via INTRAVENOUS

## 2016-03-23 MED ORDER — PHENYLEPHRINE HCL 10 MG/ML IJ SOLN
INTRAMUSCULAR | Status: DC | PRN
  Administered 2016-03-23: 80 ug via INTRAVENOUS
  Administered 2016-03-23: 120 ug via INTRAVENOUS
  Administered 2016-03-23 (×2): 80 ug via INTRAVENOUS

## 2016-03-23 MED ORDER — SODIUM CHLORIDE 0.9 % IV SOLN: 250.0000 mL | INTRAVENOUS | Status: DC | PRN

## 2016-03-23 MED ORDER — BUPIVACAINE HCL (PF) 0.25 % IJ SOLN
INTRAMUSCULAR | Status: AC
  Filled 2016-03-23: qty 30

## 2016-03-23 MED ORDER — DEXAMETHASONE SODIUM PHOSPHATE 4 MG/ML IJ SOLN
INTRAMUSCULAR | Status: DC | PRN
  Administered 2016-03-23: 10 mg via INTRAVENOUS

## 2016-03-23 MED ORDER — ACETAMINOPHEN 650 MG RE SUPP: 650.0000 mg | RECTAL | Status: DC | PRN

## 2016-03-23 MED ORDER — CIPROFLOXACIN IN D5W 400 MG/200ML IV SOLN
INTRAVENOUS | Status: AC
  Filled 2016-03-23: qty 200

## 2016-03-23 MED ORDER — FENTANYL CITRATE (PF) 100 MCG/2ML IJ SOLN
50.0000 ug | INTRAMUSCULAR | Status: AC | PRN
  Administered 2016-03-23: 50 ug via INTRAVENOUS
  Administered 2016-03-23 (×2): 25 ug via INTRAVENOUS

## 2016-03-23 MED ORDER — SUGAMMADEX SODIUM 200 MG/2ML IV SOLN
INTRAVENOUS | Status: AC
  Filled 2016-03-23: qty 2

## 2016-03-23 MED ORDER — CIPROFLOXACIN IN D5W 400 MG/200ML IV SOLN
400.0000 mg | INTRAVENOUS | Status: AC
  Administered 2016-03-23 (×2): 400 mg via INTRAVENOUS

## 2016-03-23 MED ORDER — FENTANYL CITRATE (PF) 100 MCG/2ML IJ SOLN: 25.0000 ug | INTRAMUSCULAR | Status: DC | PRN

## 2016-03-23 MED ORDER — LIDOCAINE HCL 1 % IJ SOLN
INTRAMUSCULAR | Status: DC | PRN
  Administered 2016-03-23: 50 mL

## 2016-03-23 MED ORDER — KETOROLAC TROMETHAMINE 30 MG/ML IJ SOLN
INTRAMUSCULAR | Status: AC
  Filled 2016-03-23: qty 1

## 2016-03-23 MED ORDER — MIDAZOLAM HCL 2 MG/2ML IJ SOLN
1.0000 mg | INTRAMUSCULAR | Status: DC | PRN
  Administered 2016-03-23: 2 mg via INTRAVENOUS

## 2016-03-23 MED ORDER — DEXAMETHASONE SODIUM PHOSPHATE 10 MG/ML IJ SOLN
INTRAMUSCULAR | Status: AC
  Filled 2016-03-23: qty 1

## 2016-03-23 MED ORDER — EPINEPHRINE PF 1 MG/ML IJ SOLN
INTRAMUSCULAR | Status: DC | PRN
Start: 2016-03-23 — End: 2016-03-23
  Administered 2016-03-23: 1 mg

## 2016-03-23 MED ORDER — EPINEPHRINE 30 MG/30ML IJ SOLN
INTRAMUSCULAR | Status: AC
  Filled 2016-03-23: qty 1

## 2016-03-23 MED ORDER — MEPERIDINE HCL 25 MG/ML IJ SOLN: 6.2500 mg | INTRAMUSCULAR | Status: DC | PRN

## 2016-03-23 MED ORDER — KETOROLAC TROMETHAMINE 30 MG/ML IJ SOLN
INTRAMUSCULAR | Status: DC | PRN
  Administered 2016-03-23: 30 mg via INTRAVENOUS

## 2016-03-23 MED ORDER — MIDAZOLAM HCL 2 MG/2ML IJ SOLN: 0.5000 mg | Freq: Once | INTRAMUSCULAR | Status: DC | PRN

## 2016-03-23 MED ORDER — PROPOFOL 10 MG/ML IV BOLUS
INTRAVENOUS | Status: DC | PRN
  Administered 2016-03-23: 200 mg via INTRAVENOUS

## 2016-03-23 MED ORDER — LIDOCAINE HCL (PF) 1 % IJ SOLN
INTRAMUSCULAR | Status: AC
  Filled 2016-03-23: qty 60

## 2016-03-23 MED ORDER — ONDANSETRON HCL 4 MG/2ML IJ SOLN
INTRAMUSCULAR | Status: DC | PRN
  Administered 2016-03-23: 4 mg via INTRAVENOUS

## 2016-03-23 MED ORDER — PROMETHAZINE HCL 25 MG/ML IJ SOLN: 6.2500 mg | INTRAMUSCULAR | Status: DC | PRN

## 2016-03-23 MED ORDER — ACETAMINOPHEN 325 MG PO TABS: 650.0000 mg | ORAL_TABLET | ORAL | Status: DC | PRN

## 2016-03-23 MED ORDER — MIDAZOLAM HCL 2 MG/2ML IJ SOLN
INTRAMUSCULAR | Status: AC
  Filled 2016-03-23: qty 2

## 2016-03-23 MED ORDER — SODIUM CHLORIDE 0.9% FLUSH: 3.0000 mL | Freq: Two times a day (BID) | INTRAVENOUS | Status: DC

## 2016-03-23 MED ORDER — LIDOCAINE-EPINEPHRINE (PF) 1 %-1:200000 IJ SOLN
INTRAMUSCULAR | Status: DC | PRN
  Administered 2016-03-23: 5 mL

## 2016-03-23 SURGICAL SUPPLY — 49 items
BINDER ABDOMINAL  9 SM 30-45 (SOFTGOODS)
BINDER ABDOMINAL 10 UNV 27-48 (MISCELLANEOUS) ×3 IMPLANT
BINDER ABDOMINAL 12 SM 30-45 (SOFTGOODS) IMPLANT
BINDER ABDOMINAL 9 SM 30-45 (SOFTGOODS) IMPLANT
BINDER BREAST LRG (GAUZE/BANDAGES/DRESSINGS) IMPLANT
BINDER BREAST MEDIUM (GAUZE/BANDAGES/DRESSINGS) IMPLANT
BINDER BREAST XLRG (GAUZE/BANDAGES/DRESSINGS) ×3 IMPLANT
BINDER BREAST XXLRG (GAUZE/BANDAGES/DRESSINGS) IMPLANT
BLADE HEX COATED 2.75 (ELECTRODE) IMPLANT
BLADE SURG 15 STRL LF DISP TIS (BLADE) ×1 IMPLANT
BLADE SURG 15 STRL SS (BLADE) ×2
BNDG GAUZE ELAST 4 BULKY (GAUZE/BANDAGES/DRESSINGS) ×6 IMPLANT
CHLORAPREP W/TINT 26ML (MISCELLANEOUS) ×3 IMPLANT
COVER BACK TABLE 60X90IN (DRAPES) ×3 IMPLANT
COVER MAYO STAND STRL (DRAPES) ×3 IMPLANT
DECANTER SPIKE VIAL GLASS SM (MISCELLANEOUS) IMPLANT
DERMABOND ADVANCED (GAUZE/BANDAGES/DRESSINGS) ×2
DERMABOND ADVANCED .7 DNX12 (GAUZE/BANDAGES/DRESSINGS) ×1 IMPLANT
DRAPE LAPAROSCOPIC ABDOMINAL (DRAPES) ×3 IMPLANT
DRSG PAD ABDOMINAL 8X10 ST (GAUZE/BANDAGES/DRESSINGS) ×6 IMPLANT
ELECT REM PT RETURN 9FT ADLT (ELECTROSURGICAL) ×3
ELECTRODE REM PT RTRN 9FT ADLT (ELECTROSURGICAL) ×1 IMPLANT
EXTRACTOR CANIST REVOLVE STRL (CANNISTER) ×3 IMPLANT
FILTER LIPOSUCTION (MISCELLANEOUS) ×3 IMPLANT
GLOVE BIO SURGEON STRL SZ 6.5 (GLOVE) ×8 IMPLANT
GLOVE BIO SURGEONS STRL SZ 6.5 (GLOVE) ×4
GOWN STRL REUS W/ TWL LRG LVL3 (GOWN DISPOSABLE) ×2 IMPLANT
GOWN STRL REUS W/TWL LRG LVL3 (GOWN DISPOSABLE) ×4
IV LACTATED RINGERS 1000ML (IV SOLUTION) ×9 IMPLANT
LINER CANISTER 1000CC FLEX (MISCELLANEOUS) ×3 IMPLANT
NDL SAFETY ECLIPSE 18X1.5 (NEEDLE) ×1 IMPLANT
NEEDLE HYPO 18GX1.5 SHARP (NEEDLE) ×2
NEEDLE HYPO 25X1 1.5 SAFETY (NEEDLE) IMPLANT
PACK BASIN DAY SURGERY FS (CUSTOM PROCEDURE TRAY) ×3 IMPLANT
PAD ALCOHOL SWAB (MISCELLANEOUS) ×3 IMPLANT
PENCIL BUTTON HOLSTER BLD 10FT (ELECTRODE) IMPLANT
SLEEVE SCD COMPRESS KNEE MED (MISCELLANEOUS) ×3 IMPLANT
SPONGE LAP 18X18 X RAY DECT (DISPOSABLE) ×3 IMPLANT
SUT MNCRL AB 4-0 PS2 18 (SUTURE) IMPLANT
SUT MON AB 5-0 PS2 18 (SUTURE) ×6 IMPLANT
SYR 10ML LL (SYRINGE) ×12 IMPLANT
SYR 3ML 18GX1 1/2 (SYRINGE) IMPLANT
SYR 50ML LL SCALE MARK (SYRINGE) ×6 IMPLANT
SYR CONTROL 10ML LL (SYRINGE) ×3 IMPLANT
SYR TOOMEY 50ML (SYRINGE) ×6 IMPLANT
TOWEL OR 17X24 6PK STRL BLUE (TOWEL DISPOSABLE) ×6 IMPLANT
TUBING INFILTRATION IT-10001 (TUBING) IMPLANT
TUBING SET GRADUATE ASPIR 12FT (MISCELLANEOUS) ×3 IMPLANT
UNDERPAD 30X30 (UNDERPADS AND DIAPERS) ×6 IMPLANT

## 2016-03-23 NOTE — Interval H&P Note (Signed)
History and Physical Interval Note:  09/24/2818 6:01 AM  Vicki Hale  has presented today for surgery, with the diagnosis of HISTORY OF BREAST CANCER  The various methods of treatment have been discussed with the patient and family. After consideration of risks, benefits and other options for treatment, the patient has consented to  Procedure(s): BILATERAL FAT GRAFTING (Bilateral) as a surgical intervention .  The patient's history has been reviewed, patient examined, no change in status, stable for surgery.  I have reviewed the patient's chart and labs.  Questions were answered to the patient's satisfaction.     [name]

## 2016-03-23 NOTE — Discharge Instructions (Signed)
May shower tomorrow Continue spanx or binders No heavy lifting      Post Anesthesia Home Care Instructions  Activity: Get plenty of rest for the remainder of the day. A responsible adult should stay with you for 24 hours following the procedure.  For the next 24 hours, DO NOT: -Drive a car -Paediatric nurse -Drink alcoholic beverages -Take any medication unless instructed by your physician -Make any legal decisions or sign important papers.  Meals: Start with liquid foods such as gelatin or soup. Progress to regular foods as tolerated. Avoid greasy, spicy, heavy foods. If nausea and/or vomiting occur, drink only clear liquids until the nausea and/or vomiting subsides. Call your physician if vomiting continues.  Special Instructions/Symptoms: Your throat may feel dry or sore from the anesthesia or the breathing tube placed in your throat during surgery. If this causes discomfort, gargle with warm salt water. The discomfort should disappear within 24 hours.  If you had a scopolamine patch placed behind your ear for the management of post- operative nausea and/or vomiting:  1. The medication in the patch is effective for 72 hours, after which it should be removed.  Wrap patch in a tissue and discard in the trash. Wash hands thoroughly with soap and water. 2. You may remove the patch earlier than 72 hours if you experience unpleasant side effects which may include dry mouth, dizziness or visual disturbances. 3. Avoid touching the patch. Wash your hands with soap and water after contact with the patch.     Call your surgeon if you experience:   1.  Fever over 101.0. 2.  Inability to urinate. 3.  Nausea and/or vomiting. 4.  Extreme swelling or bruising at the surgical site. 5.  Continued bleeding from the incision. 6.  Increased pain, redness or drainage from the incision. 7.  Problems related to your pain medication. 8.  Any problems and/or concerns

## 2016-03-23 NOTE — H&P (View-Only) (Signed)
Vicki Hale is an 66 y.o. female.   Chief Complaint: breast asymmetry HPI:The patient is a 66 yrs old wf here for breast reconstruction surgery.   She has not had any additional redness or swelling in the left breast.  She has mild loss of fat in the right upper breast.  This may need further lipofilling.  She would like better symmetry History: The patient went for a routine mammogram and was found to have calcifications of the LEFT upper outer quadrante. Biopsy revealed invasive ductal carcionoma, Grade 2, ER/PR positive and HER2 negative. She is being seen by Dr. Excell Seltzer. She was treated for ovarian cancer by a hysterectomy and salping-ooopherectomy in 2003. She is 5 feet 7 inches tall, weights 168 pounds, Preop bra = 38C. She had mild Grade II ptosis of her breasts. Both daugters are BRCA positive. The sternal notch to the NAC rightt = 30 cm and left = 28 cm, IMF 9 cm.   Past Medical History:  Diagnosis Date  . Arthritis   . Breast cancer ([place]) 2017   [facility] in [place]  . Family history of breast cancer   . Family history of pancreatic cancer   . FH: BRCA1 gene positive   . Ovarian cancer ([place])   . Pericarditis, acute    30 YRS AGO   . SAH (subarachnoid hemorrhage) ([place])    04/2014  . Stroke [facility])    no deficits    Past Surgical History:  Procedure Laterality Date  . BREAST RECONSTRUCTION WITH PLACEMENT OF TISSUE EXPANDER AND FLEX HD (ACELLULAR HYDRATED DERMIS) Bilateral 03/18/2015   Procedure: BILATERAL BREAST RECONSTRUCTION WITH PLACEMENT OF TISSUE EXPANDER AND FLEX HD (ACELLULAR HYDRATED DERMIS);  Surgeon: Loel Lofty Dillingham, DO;  Location: [place];  Service: Plastics;  Laterality: Bilateral;  . CESAREAN SECTION     X2  . HERNIA REPAIR    . LIPOSUCTION WITH LIPOFILLING Bilateral 12/02/2015   Procedure: LIPOSUCTION WITH LIPOFILLING FROM ABDOMEN TO BILATERAL BREAST;  Surgeon: [name], DO;  Location: [place];  Service: Plastics;  Laterality: Bilateral;   . MASTECTOMY W/ SENTINEL NODE BIOPSY Left 03/18/2015   Procedure: LEFT TOTAL MASTECTOMY WITH LEFT AXILLARY SENTINEL LYMPH NODE BIOPSY;  Surgeon: Excell Seltzer, MD;  Location: [place];  Service: General;  Laterality: Left;  Marland Kitchen MASTECTOMY, PARTIAL Right 03/18/2015   Procedure: RIGHT PROPHYLACTIC MASTECTOMY ;  Surgeon: Excell Seltzer, MD;  Location: [place];  Service: General;  Laterality: Right;  . REMOVAL OF BILATERAL TISSUE EXPANDERS WITH PLACEMENT OF BILATERAL BREAST IMPLANTS Bilateral 08/12/2015   Procedure: REMOVAL OF BILATERAL TISSUE EXPANDERS WITH PLACEMENT OF BILATERAL BREAST IMPLANTS;  Surgeon: [name], DO;  Location: [place];  Service: Plastics;  Laterality: Bilateral;  . TONSILLECTOMY    . TOTAL ABDOMINAL HYSTERECTOMY W/ BILATERAL SALPINGOOPHORECTOMY      Family History  Problem Relation Age of Onset  . Colon polyps Mother   . Breast cancer Daughter 75  . BRCA 1/2 Daughter     BRCA1  . COPD Maternal Aunt   . Esophageal cancer Maternal Uncle   . Lung cancer Paternal Aunt   . Pancreatic cancer Maternal Grandmother     early 48s  . Prostate cancer Maternal Grandfather     27s  . Skin cancer Paternal Grandmother     untreated BCC  . COPD Maternal Aunt   . Lung cancer Maternal Aunt   . Breast cancer Cousin 24    maternal first cousin  .  BRCA 1/2 Daughter     BRCA1  . Esophageal cancer Cousin     maternal first cousin  . Bladder Cancer Paternal Aunt   . Colon cancer Neg Hx   . Rectal cancer Neg Hx   . Ulcerative colitis Neg Hx   . Stomach cancer Neg Hx    Social History:  reports that she has never smoked. She has never used smokeless tobacco. She reports that she drinks alcohol. She reports that she does not use drugs.  Allergies:  Allergies  Allergen Reactions  . Taxotere [Docetaxel] Rash  . Penicillins Itching and Rash    Has patient had a PCN reaction causing immediate rash, facial/tongue/throat swelling, SOB or lightheadedness with  hypotension: No Has patient had a PCN reaction causing severe rash involving mucus membranes or skin necrosis: No Has patient had a PCN reaction that required hospitalization No Has patient had a PCN reaction occurring within the last 10 years: No If all of the above answers are "NO", then may proceed with Cephalosporin use.      (Not in a hospital admission)  No results found for this or any previous visit (from the past 48 hour(s)). No results found.  Review of Systems  Constitutional: Negative.   HENT: Negative.   Eyes: Negative.   Respiratory: Negative.   Cardiovascular: Negative.   Gastrointestinal: Negative.   Genitourinary: Negative.   Musculoskeletal: Negative.   Skin: Negative.   Neurological: Negative.   Psychiatric/Behavioral: Negative.     There were no vitals taken for this visit. Physical Exam  Constitutional: She is oriented to person, place, and time. She appears well-developed and well-nourished.  HENT:  Head: Normocephalic and atraumatic.  Eyes: EOM are normal. Pupils are equal, round, and reactive to light.  Cardiovascular: Normal rate.   Respiratory: Effort normal. No respiratory distress.  GI: Soft. She exhibits no distension. There is no tenderness.  Musculoskeletal: She exhibits no edema.  Neurological: She is alert and oriented to person, place, and time.  Skin: Skin is warm. No rash noted. No erythema.  Psychiatric: She has a normal mood and affect. Her behavior is normal. Judgment and thought content normal.     Assessment/Plan  Bilateral lipofilling of breasts:  The risks that can be encountered with and after liposuction with fat grafting to the breasts were discussed and include the following but no limited to these:  Asymmetry, fluid accumulation, firmness of the area, fat necrosis with death of fat tissue, bleeding, infection, delayed healing, anesthesia risks, skin sensation changes, injury to structures including nerves, blood vessels,  and muscles which may be temporary or permanent, allergies to tape, suture materials and glues, blood products, topical preparations or injected agents, skin and contour irregularities, skin discoloration and swelling, deep vein thrombosis, cardiac and pulmonary complications, pain, which may persist, persistent pain, recurrence of the lesion, poor healing of the incision, possible need for revisional surgery or staged procedures. There can also be persistent swelling, poor wound healing, rippling or loose skin, worsening of cellulite,and swelling. Any change in weight fluctuations can alter the outcome.  [name], DO 03/21/2016, 11:08 AM

## 2016-03-23 NOTE — Anesthesia Postprocedure Evaluation (Signed)
Anesthesia Post Note  Patient: Vicki Hale  Procedure(s) Performed: Procedure(s) (LRB): BILATERAL FAT GRAFTING (Bilateral)  Patient location during evaluation: PACU Anesthesia Type: General Level of consciousness: awake and alert, oriented and patient cooperative Pain management: pain level controlled Vital Signs Assessment: post-procedure vital signs reviewed and stable Respiratory status: spontaneous breathing, nonlabored ventilation and respiratory function stable Cardiovascular status: blood pressure returned to baseline and stable Postop Assessment: no signs of nausea or vomiting Anesthetic complications: no       Last Vitals:  Vitals:   03/23/16 1015 03/23/16 1051  BP: (!) 142/81 (!) 147/81  Pulse: 76 77  Resp: 12 18  Temp:  36.4 C    Last Pain:  Vitals:   03/23/16 1051  TempSrc: Oral  PainSc:                  Kylena Mole,E. Raaga Maeder

## 2016-03-23 NOTE — Anesthesia Preprocedure Evaluation (Addendum)
Anesthesia Evaluation  Patient identified by MRN, date of birth, ID band Patient awake    Reviewed: Allergy & Precautions, NPO status , Patient's Chart, lab work & pertinent test results  History of Anesthesia Complications Negative for: history of anesthetic complications  Airway Mallampati: II  TM Distance: >3 FB Neck ROM: Full  Mouth opening: Limited Mouth Opening  Dental  (+) Dental Advisory Given, Teeth Intact   Pulmonary neg pulmonary ROS,    breath sounds clear to auscultation       Cardiovascular negative cardio ROS   Rhythm:Regular Rate:Normal     Neuro/Psych CVA (h/o SAH), No Residual Symptoms    GI/Hepatic Neg liver ROS, GERD  Controlled,  Endo/Other  negative endocrine ROS  Renal/GU negative Renal ROS     Musculoskeletal  (+) Arthritis ,   Abdominal   Peds  Hematology negative hematology ROS (+)   Anesthesia Other Findings Breast cancer Ovarian cancer  Reproductive/Obstetrics                            Anesthesia Physical Anesthesia Plan  ASA: III  Anesthesia Plan: General   Post-op Pain Management:    Induction: Intravenous  Airway Management Planned: LMA  Additional Equipment:   Intra-op Plan:   Post-operative Plan:   Informed Consent: I have reviewed the patients History and Physical, chart, labs and discussed the procedure including the risks, benefits and alternatives for the proposed anesthesia with the patient or authorized representative who has indicated his/her understanding and acceptance.   Dental advisory given  Plan Discussed with: CRNA and Surgeon  Anesthesia Plan Comments: (Plan routine monitors, GA- LMA OK)        Anesthesia Quick Evaluation

## 2016-03-23 NOTE — Op Note (Signed)
DATE OF OPERATION: 03/23/2016  LOCATION: [facility]  PREOPERATIVE DIAGNOSIS: Bilateral Breast Asymmetry after reconstruction  POSTOPERATIVE DIAGNOSIS: Same  PROCEDURE: fat grafting to bilateral breasts for symmetry after reconstruction  SURGEON: Claire Sanger Dillingham, DO  ASSISTANT: Shawn Rayburn, PA  EBL: 10 cc  CONDITION: Stable  COMPLICATIONS: None  INDICATION: The patient, Vicki Hale, is a 66 y.o. female born on 02/10/1950, is here for treatment of breast asymmetry after reconstruction for breast cancer.  She underwent mastectomies with expander placement followed by implants.  She has some upper poll loss of volume and asymmetry.  PROCEDURE DETAILS:  The patient was seen prior to surgery and marked.  The IV antibiotics were given. The patient was taken to the operating room and given a general anesthetic. A standard time out was performed and all information was confirmed by those in the room. SCDs were placed.   The breast and abdomen were prepped and draped in the usual sterile fashion.  The local was used to inject in the abdominal incision area at the umbilicus and at the lateral and medial aspect of each breast.  The tumescent was infused into the abdominal fat.  After waiting for the local to take effect the liposuction was performed.  The fat was prepared with the Revolve device.  The fat was then injected into the upper and lateral poll of each breast with 80 cc on the right and 56 cc on the left.  The incisions were closed with 5-0 Monocryl.  The patient was allowed to wake up and taken to recovery room in stable condition at the end of the case. A breast and abdominal binder were applied.  The family was notified at the end of the case.

## 2016-03-23 NOTE — Anesthesia Procedure Notes (Addendum)
Procedure Name: LMA Insertion Date/Time: 03/23/2016 8:41 AM Performed by: Wanita Chamberlain Pre-anesthesia Checklist: Patient identified, Emergency Drugs available, Suction available, Patient being monitored and Timeout performed Patient Re-evaluated:Patient Re-evaluated prior to inductionOxygen Delivery Method: Circle system utilized Preoxygenation: Pre-oxygenation with 100% oxygen Intubation Type: IV induction Ventilation: Mask ventilation without difficulty LMA: LMA inserted LMA Size: 4.0 Placement Confirmation: positive ETCO2 and breath sounds checked- equal and bilateral Tube secured with: Tape Dental Injury: Teeth and Oropharynx as per pre-operative assessment

## 2016-03-23 NOTE — Transfer of Care (Signed)
Immediate Anesthesia Transfer of Care Note  Patient: Vicki Hale  Procedure(s) Performed: Procedure(s): BILATERAL FAT GRAFTING (Bilateral)  Patient Location: PACU  Anesthesia Type:General  Level of Consciousness: awake, alert , oriented and patient cooperative  Airway & Oxygen Therapy: Patient Spontanous Breathing and Patient connected to face mask oxygen  Post-op Assessment: Report given to RN and Post -op Vital signs reviewed and stable  Post vital signs: Reviewed and stable  Last Vitals:  Vitals:   03/23/16 0744 03/23/16 0955  BP: (!) 121/59 (P) 126/65  Pulse: 66   Resp: 18   Temp: 36.7 C (P) 36.4 C    Last Pain:  Vitals:   03/23/16 0744  TempSrc: Oral         Complications: No apparent anesthesia complications

## 2016-03-27 ENCOUNTER — Encounter (HOSPITAL_BASED_OUTPATIENT_CLINIC_OR_DEPARTMENT_OTHER): Payer: Self-pay | Admitting: Plastic Surgery

## 2016-05-26 IMAGING — MR MR HEAD WO/W CM
11 of 12 series · 44 of 48 positions shown · IV contrast (multihance)
Comparison: None.

CLINICAL DATA: 63-year-old female with headaches associated with
visual changes, tremors, syncope, nausea and vomiting. By report, no
recent fall or injury. Initial encounter. Personal history of
ovarian cancer, in remission and no longer followed by oncology.

BUN and creatinine were obtained on site at [HOSPITAL] at
[HOSPITAL].
Results:  BUN 22 mg/dL,  Creatinine 0.9 mg/dL.
EXAM:
MRI HEAD WITHOUT AND WITH CONTRAST
TECHNIQUE: Multiplanar, multiecho pulse sequences of the brain and surrounding
structures were obtained without and with intravenous contrast.
CONTRAST:  15mL MULTIHANCE GADOBENATE DIMEGLUMINE 529 MG/ML IV SOLN

[Series 2: T1 · sagittal · 5.0mm · 0.45mm/px · 2 of 21 slices shown]
[im 1/21]
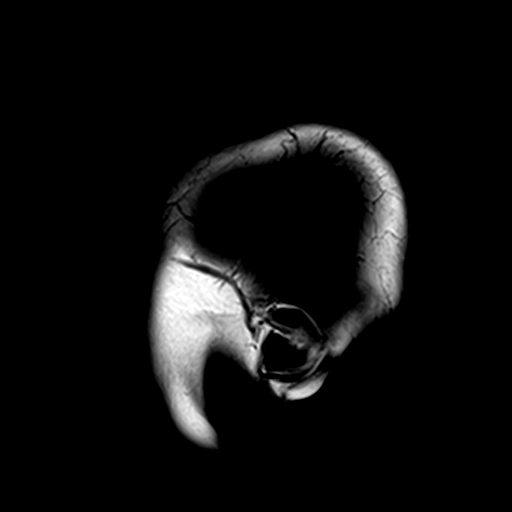
[im 21/21]
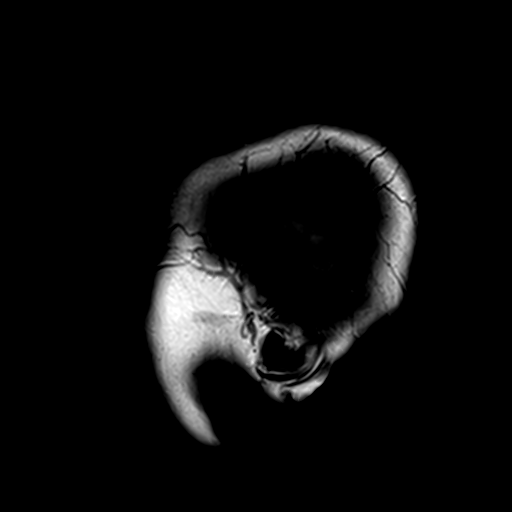

[Series 3: DWI · axial · 3.0mm · 1.80mm/px · z∈[-78,+69]mm · 8 of 100 slices shown (1 of 4)]
[im 1/100]
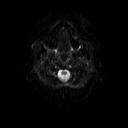
[im 15/100]
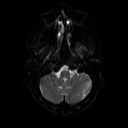
[im 29/100]
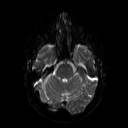
[im 43/100]
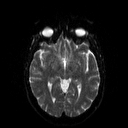
[im 57/100]
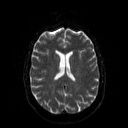
[im 71/100]
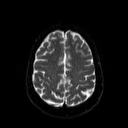
[im 85/100]
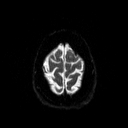
[im 100/100]
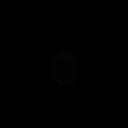

[Series 4: DWI · axial · 3.0mm · 1.80mm/px · z∈[-78,+69]mm · 4 of 48 slices shown (2 of 4)]
[im 1/48]
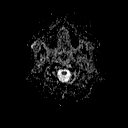
[im 16/48]
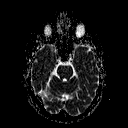
[im 32/48]
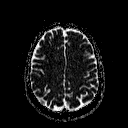
[im 48/48]
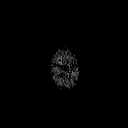

[Series 6: swi_images · axial · 2.0mm · 0.90mm/px · z∈[-83,+75]mm · 6 of 80 slices shown]
[im 1/80]
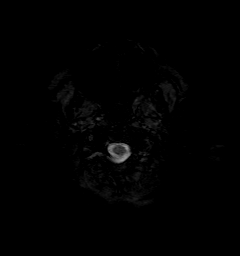
[im 16/80]
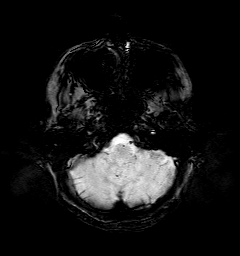
[im 32/80]
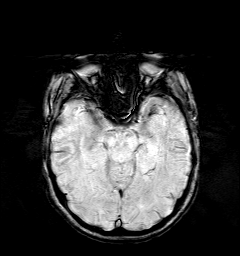
[im 48/80]
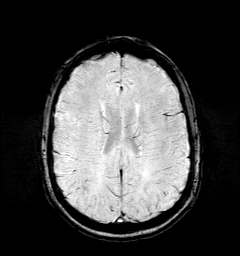
[im 64/80]
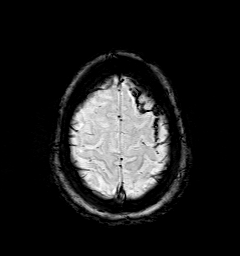
[im 80/80]
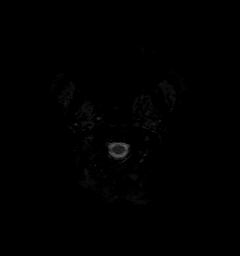

[Series 7: DWI · coronal · 5.0mm · 1.80mm/px · 5 of 68 slices shown (3 of 4)]
[im 1/68]
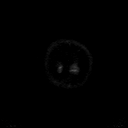
[im 17/68]
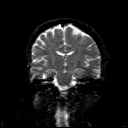
[im 34/68]
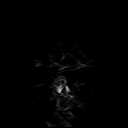
[im 51/68]
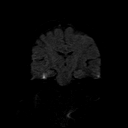
[im 68/68]
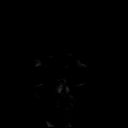

[Series 8: DWI · coronal · 5.0mm · 1.80mm/px · 3 of 34 slices shown (4 of 4)]
[im 1/34]
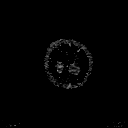
[im 17/34]
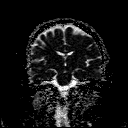
[im 34/34]
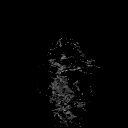

[Series 9: T2 · axial · 5.0mm · 0.51mm/px · z∈[-74,+67]mm · 2 of 22 slices shown (1 of 2)]
[im 1/22]
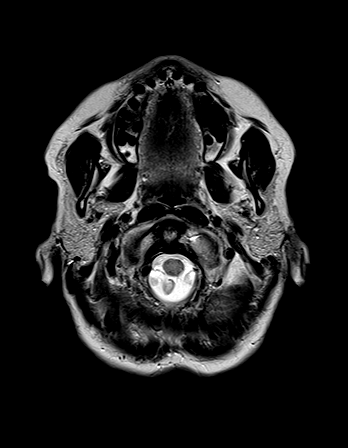
[im 22/22]
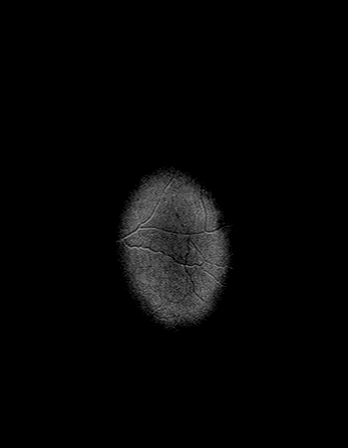

[Series 10: FLAIR · axial · 5.0mm · 0.45mm/px · z∈[-75,+67]mm · 2 of 22 slices shown]
[im 1/22]
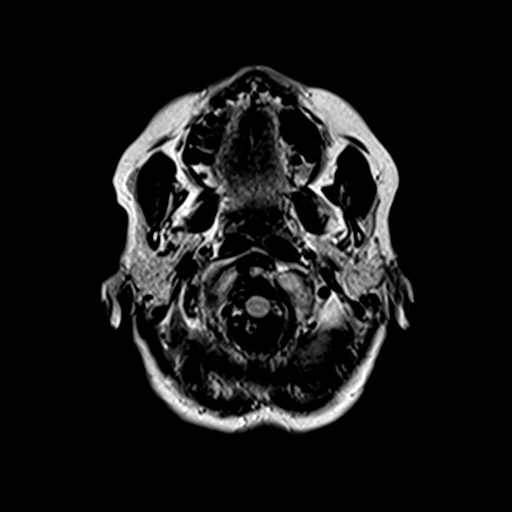
[im 22/22]
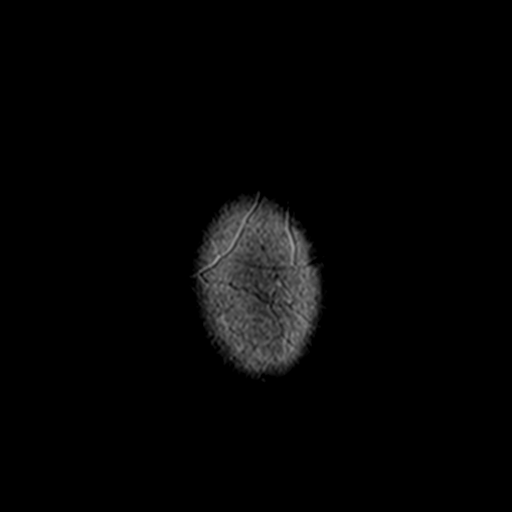

[Series 11: t1_mpr_tra · axial · 2.0mm · 0.45mm/px · z∈[-82,+76]mm · 6 of 80 slices shown (1 of 2)]
[im 1/80]
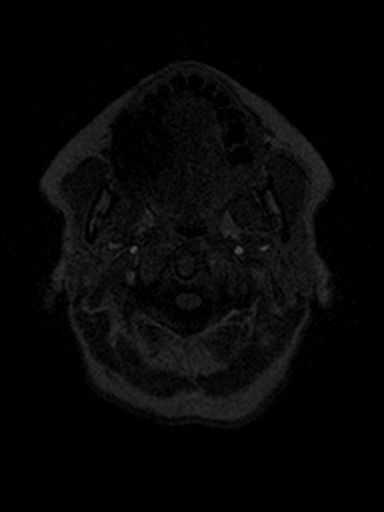
[im 16/80]
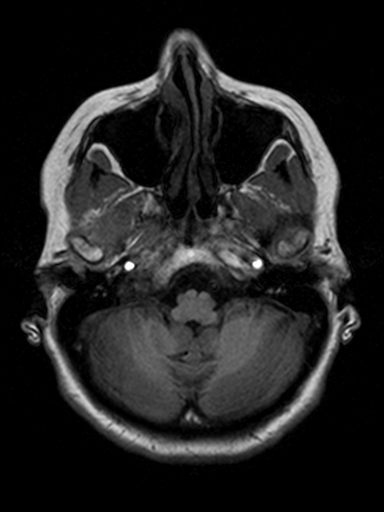
[im 32/80]
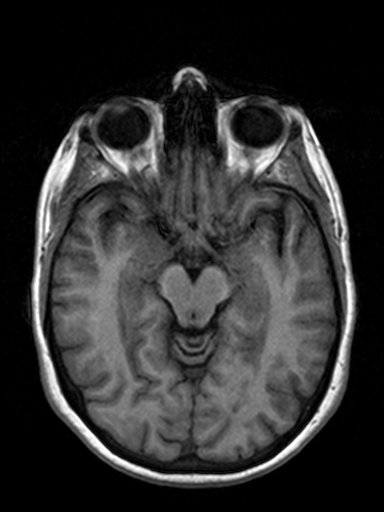
[im 48/80]
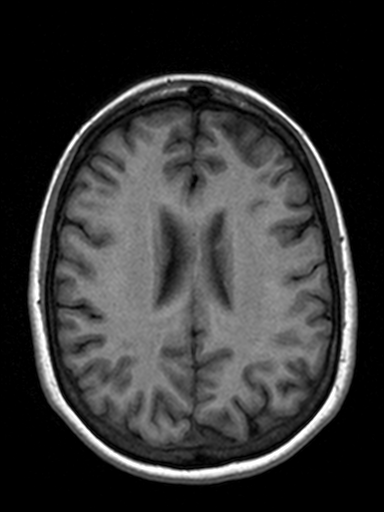
[im 64/80]
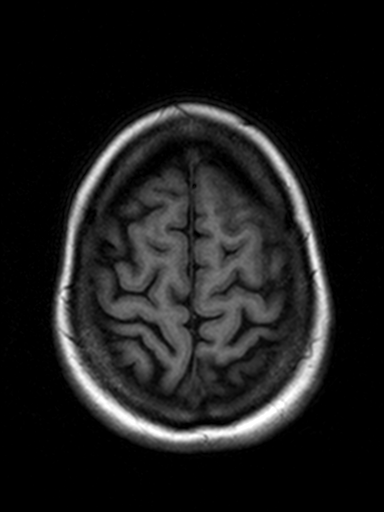
[im 80/80]
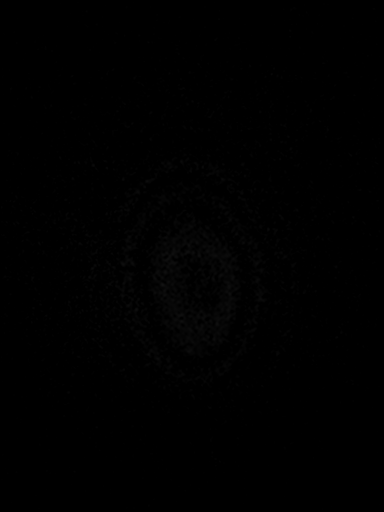

[Series 13: t1_mpr_tra · axial · 2.0mm · 0.45mm/px · z∈[-82,+12]mm · 4 of 80 slices shown (2 of 2)]
[im 1/80]
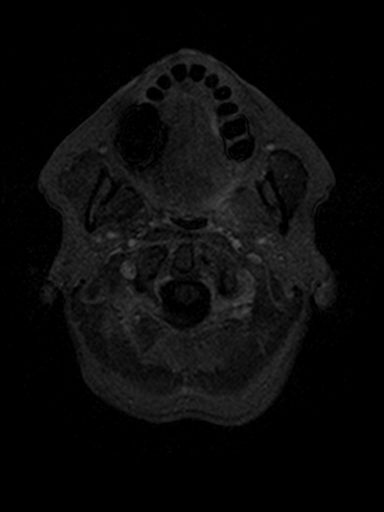
[im 16/80]
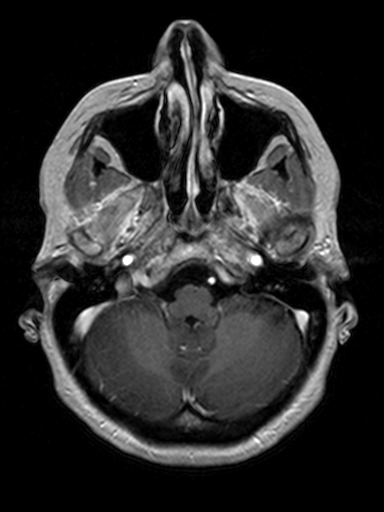
[im 32/80]
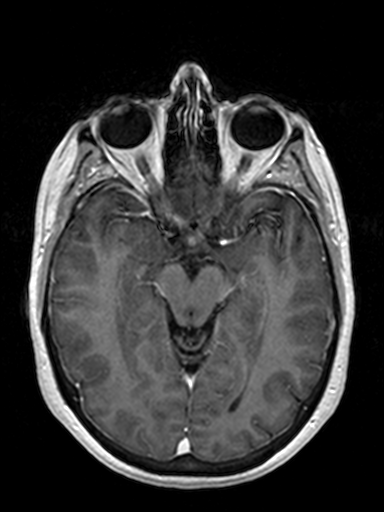
[im 48/80]
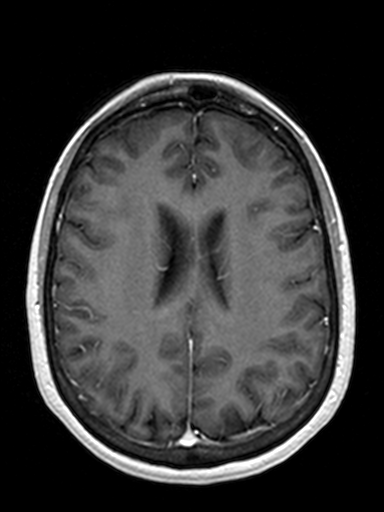

[Series 14: T2 · coronal · 5.0mm · 0.45mm/px · 2 of 25 slices shown (2 of 2)]
[im 1/25]
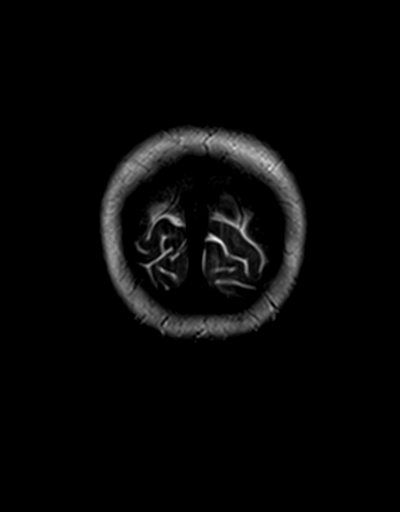
[im 25/25]
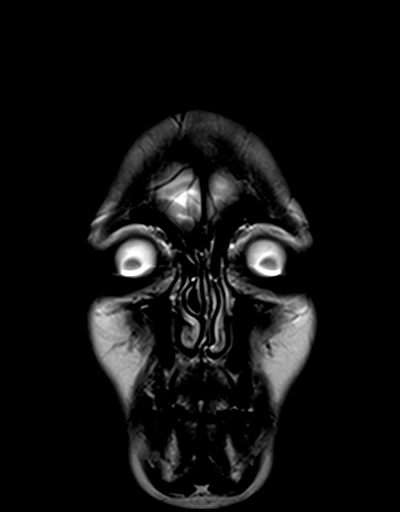

[44 of 48 positions shown; findings below may reference images not displayed]

FINDINGS: Abnormal subarachnoid signal along the left superior frontal gyrus
and anterior convexity consistent with small volume subarachnoid
hemorrhage (series 10, image 18, series 6, image 62). Mild
associated diffusion artifact in that region. No superimposed
parenchymal restricted diffusion to indicate infarct. No
subarachnoid hemorrhage identified in the basilar cisterns. No
intraventricular hemorrhage. No associated abnormal enhancement. No
overlying dural thickening or hyper enhancement identified.

No other intracranial blood products identified. No
ventriculomegaly. No midline shift, mass effect, or evidence of
intracranial mass lesion. Negative pituitary, cervicomedullary
junction and visualized cervical spine. Major intracranial vascular
flow voids are within normal limits, dominant appearing distal right
vertebral artery. Gray and white matter signal is within normal
limits for age throughout the brain.

Visible internal auditory structures appear normal. Trace mastoid
fluid. Negative visualized nasopharynx. Trace paranasal sinus
mucosal thickening. Visualized orbit soft tissues are within normal
limits. Visualized scalp soft tissues are within normal limits.
Normal bone marrow signal.
IMPRESSION: 1. Small volume of subarachnoid hemorrhage over the left superior
frontal convexity.
Etiology is unknown at this time, with no secondary findings of
infarct, vascular malformation, venous thrombosis, tumor, etc.
2. Study discussed by telephone with Dr. JHAN CARLA NERY on 05/09/2014
at the time of this dictation. We discussed Neurosurgery
consultation for cryptogenic subarachnoid hemorrhage.

## 2016-06-14 ENCOUNTER — Other Ambulatory Visit: Payer: BC Managed Care – PPO

## 2016-06-20 ENCOUNTER — Ambulatory Visit: Payer: BC Managed Care – PPO | Admitting: Oncology

## 2016-06-20 NOTE — Progress Notes (Deleted)
[place]  [phone] [phone]   ID: ANISTYN GRADDY DOB: [birth year]  MR#: [id number]  MIW#:[id number]  Patient Care Team: Leonides Sake, MD as PCP - General (Family Medicine) Earlee Herald, Virgie Dad, MD as Consulting Physician (Oncology) Rolm Bookbinder, MD as Consulting Physician (General Surgery) Consuella Lose, MD as Consulting Physician (Neurosurgery) Christene Slates, MD as Physician Assistant (Radiology) Rosalin Hawking, MD as Consulting Physician (Neurology) PCP: Leonides Sake, MD GYN: OTHER MD:  CHIEF COMPLAINT: Breast cancer in BRCA carrier  CURRENT TREATMENT: Anastrozole  CANCER HISTORY: From the original intake note:  Andilynn was diagnosed with ovarian cancer in April 2003. She underwent total abdominal hysterectomy and bilateral salpingo-oophorectomy with a full staging operation at [facility] that month and was then treated according to GOG 182, with 4 cycles of carboplatin/topotecan and then 4 cycles of carboplatin/paclitaxel. She was then followed by gynecologic oncology through 2011, and there has been no history of recurrent disease.  On 25/00/3704 Ethelmae underwent screening mammography at [facility] with tomosynthesis. Breast density was category A. New grouped calcifications were noted in the left breast upper outer quadrant and the patient was recalled for diagnostic unilateral left mammography 01/26/2015. This confirmed a group of new linear calcifications in the upper outer quadrant of the left breast extending over an area of 2.0 cm..   On 01/28/2015 the patient underwent biopsy of her left breast, with the pathology ([id number]) showing an invasive ductal carcinoma, grade 2, estrogen receptor 100% positive, progesterone receptor 50% positive, both with strong staining intensity, with an MIB-1 of 10% and no HER-2 amplification, the signals ratio being 1.21 and the number per cell 2.00.  Her subsequent history is as detailed  below  INTERVAL HISTORY: Deionna returns today for follow-up of her estrogen receptor positive breast cancer. She started anastrozole mid-July 2017. Initially she had problems sleeping, hot flashes, and achiness. She continued on the medication however and she is now sleeping much better without any medication. The achiness has essentially resolved. Her hot flashes have become minimal. She obtains a drug at a very good price.  REVIEW OF SYSTEMS: She still has peripheral neuropathy involving the feet. This is troublesome particularly at night. Otherwise a detailed review of systems today was noncontributory   Past Medical History:  Diagnosis Date  . Arthritis   . Breast cancer ([place]) 2017   [facility] in [place]  . Family history of breast cancer   . Family history of pancreatic cancer   . FH: BRCA1 gene positive   . Ovarian cancer ([place])   . Pericarditis, acute    30 YRS AGO   . SAH (subarachnoid hemorrhage) ([place])    04/2014  . Stroke [facility])    no deficits    PAST SURGICAL HISTORY: Past Surgical History:  Procedure Laterality Date  . BREAST RECONSTRUCTION WITH PLACEMENT OF TISSUE EXPANDER AND FLEX HD (ACELLULAR HYDRATED DERMIS) Bilateral 03/18/2015   Procedure: BILATERAL BREAST RECONSTRUCTION WITH PLACEMENT OF TISSUE EXPANDER AND FLEX HD (ACELLULAR HYDRATED DERMIS);  Surgeon: Loel Lofty Dillingham, DO;  Location: [place];  Service: Plastics;  Laterality: Bilateral;  . CESAREAN SECTION     X2  . HERNIA REPAIR    . LIPOSUCTION WITH LIPOFILLING Bilateral 12/02/2015   Procedure: LIPOSUCTION WITH LIPOFILLING FROM ABDOMEN TO BILATERAL BREAST;  Surgeon: [name], DO;  Location: [place];  Service: Plastics;  Laterality: Bilateral;  . LIPOSUCTION WITH LIPOFILLING Bilateral 03/23/2016   Procedure: BILATERAL FAT GRAFTING;  Surgeon: Loel Lofty Dillingham,  DO;  Location: [place];  Service: Plastics;  Laterality: Bilateral;  . MASTECTOMY W/ SENTINEL NODE BIOPSY Left  03/18/2015   Procedure: LEFT TOTAL MASTECTOMY WITH LEFT AXILLARY SENTINEL LYMPH NODE BIOPSY;  Surgeon: Excell Seltzer, MD;  Location: [place];  Service: General;  Laterality: Left;  Marland Kitchen MASTECTOMY, PARTIAL Right 03/18/2015   Procedure: RIGHT PROPHYLACTIC MASTECTOMY ;  Surgeon: Excell Seltzer, MD;  Location: [place];  Service: General;  Laterality: Right;  . REMOVAL OF BILATERAL TISSUE EXPANDERS WITH PLACEMENT OF BILATERAL BREAST IMPLANTS Bilateral 08/12/2015   Procedure: REMOVAL OF BILATERAL TISSUE EXPANDERS WITH PLACEMENT OF BILATERAL BREAST IMPLANTS;  Surgeon: [name], DO;  Location: [place];  Service: Plastics;  Laterality: Bilateral;  . TONSILLECTOMY    . TOTAL ABDOMINAL HYSTERECTOMY W/ BILATERAL SALPINGOOPHORECTOMY      FAMILY HISTORY Family History  Problem Relation Age of Onset  . Colon polyps Mother   . Breast cancer Daughter 12  . BRCA 1/2 Daughter        BRCA1  . COPD Maternal Aunt   . Esophageal cancer Maternal Uncle   . Lung cancer Paternal Aunt   . Pancreatic cancer Maternal Grandmother        early 37s  . Prostate cancer Maternal Grandfather        26s  . Skin cancer Paternal Grandmother        untreated BCC  . COPD Maternal Aunt   . Lung cancer Maternal Aunt   . Breast cancer Cousin 63       maternal first cousin  . BRCA 1/2 Daughter        BRCA1  . Esophageal cancer Cousin        maternal first cousin  . Bladder Cancer Paternal Aunt   . Colon cancer Neg Hx   . Rectal cancer Neg Hx   . Ulcerative colitis Neg Hx   . Stomach cancer Neg Hx    the patient's 2 daughters have been tested for the BRCA gene and both are positive. One of them had breast cancer at the age of 4. In addition on the maternal side there is pancreatic prostate breast and esophageal cancer. The patient's father died at the age of 30 and a car accident and there is very little information on his side of the family. The patient's mother died at 23 from COPD. She had had a  hysterectomy in her 15s. The patient's mother had 5 siblings several from died young. The patient herself had no sisters, 2 brothers.  GYNECOLOGIC HISTORY:  No LMP recorded. Patient has had a hysterectomy.  Menarche age 32, first live birth age 9, the patient is [place] P2. She underwent TAH/BSO in 2003 for her ovarian cancer. She took hormone replacement for approximately 6 months. She was on oral contraceptives for approximately 4 years remotely, with no complications.  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 Her husband Shanon Brow works for a [employer]. Daughter Cythia Bachtel lives in [place] where she works as a school principal. She is 46 years old. Daughter Marci Polito lives in [place] and is a Pharmacist, hospital. She is 65 years old. These ages are as of January 2017. The patient has 4 grandchildren. She attends a [employer]    ADVANCED DIRECTIVES: Not in place   HEALTH MAINTENANCE: Social History  Substance Use Topics  . Smoking status: Never Smoker  .  Smokeless tobacco: Never Used  . Alcohol use 0.0 oz/week     Comment: ocassional; once monthly     Colonoscopy: September 2016/ [facility]  PAP:  Bone density: 2016  Lipid panel:  Allergies  Allergen Reactions  . Taxotere [Docetaxel] Rash  . Penicillins Itching and Rash    Has patient had a PCN reaction causing immediate rash, facial/tongue/throat swelling, SOB or lightheadedness with hypotension: No Has patient had a PCN reaction causing severe rash involving mucus membranes or skin necrosis: No Has patient had a PCN reaction that required hospitalization No Has patient had a PCN reaction occurring within the last 10 years: No If all of the above answers are "NO", then may proceed with Cephalosporin use.     Current Outpatient Prescriptions  Medication Sig Dispense Refill  . anastrozole (ARIMIDEX) 1 MG tablet Take 1 tablet (1 mg total) by mouth  daily. 90 tablet 4  . Ascorbic Acid (VITAMIN C) 1000 MG tablet Take 1,000 mg by mouth daily.     . B Complex Vitamins (B COMPLEX PO) Take 2 tablets by mouth daily.    . clobetasol ointment (TEMOVATE) 3.79 % Apply 1 application topically 2 (two) times daily. Apply to skin rash twice daily 30 g 0  . loratadine (CLARITIN) 10 MG tablet Take 10 mg by mouth daily.    . Multiple Vitamin (MULTIVITAMIN WITH MINERALS) TABS tablet Take 2 tablets by mouth daily.    . Vitamin D, Cholecalciferol, 1000 units CAPS Take by mouth.     No current facility-administered medications for this visit.     OBJECTIVE: Middle-aged white woman Who appears well There were no vitals filed for this visit.   There is no height or weight on file to calculate BMI.    ECOG FS:0 - Asymptomatic  Sclerae unicteric, EOMs intact Oropharynx clear and moist No cervical or supraclavicular adenopathy Lungs no rales or rhonchi Heart regular rate and rhythm Abd soft, nontender, positive bowel sounds MSK no focal spinal tenderness, no upper extremity lymphedema Neuro: nonfocal, well oriented, appropriate affect Breasts: Status post bilateral mastectomies with implants in place. The cosmetic result is good. There is no evidence of local recurrence. Both axillae are benign.     LAB RESULTS:  CMP     Component Value Date/Time   NA 142 07/29/2015 0950   K 4.2 07/29/2015 0950   CL 104 03/18/2015 1653   CO2 25 07/29/2015 0950   GLUCOSE 89 07/29/2015 0950   BUN 18.0 07/29/2015 0950   CREATININE 0.8 07/29/2015 0950   CALCIUM 9.4 07/29/2015 0950   PROT 6.9 07/29/2015 0950   ALBUMIN 3.7 07/29/2015 0950   AST 21 07/29/2015 0950   ALT 18 07/29/2015 0950   ALKPHOS 91 07/29/2015 0950   BILITOT 0.34 07/29/2015 0950   GFRNONAA >60 03/18/2015 1653   GFRAA >60 03/18/2015 1653    INo results found for: SPEP, UPEP  Lab Results  Component Value Date   WBC 4.8 07/29/2015   NEUTROABS 3.3 07/29/2015   HGB 12.5 07/29/2015   HCT  37.7 07/29/2015   MCV 92.2 07/29/2015   PLT 397 07/29/2015      Chemistry      Component Value Date/Time   NA 142 07/29/2015 0950   K 4.2 07/29/2015 0950   CL 104 03/18/2015 1653   CO2 25 07/29/2015 0950   BUN 18.0 07/29/2015 0950   CREATININE 0.8 07/29/2015 0950      Component Value Date/Time   CALCIUM 9.4 07/29/2015 0950  ALKPHOS 91 07/29/2015 0950   AST 21 07/29/2015 0950   ALT 18 07/29/2015 0950   BILITOT 0.34 07/29/2015 0950      No results found for: LABCA2  No components found for: CVUDT143  No results for input(s): INR in the last 168 hours.  Urinalysis No results found for: COLORURINE, APPEARANCEUR, LABSPEC, PHURINE, GLUCOSEU, HGBUR, BILIRUBINUR, KETONESUR, PROTEINUR, UROBILINOGEN, NITRITE, LEUKOCYTESUR  STUDIES: No results found.  ASSESSMENT: 66 y.o. BRCA1 positive [place], [place] woman  (1) ovarian cancer stage IIIB diagnosed April 2003  (a) s/p supracervical hysterectomy and BSO with surgical staging April 2003  (b) treated according to GOG 182: carboplatin/ topotecan x4 followed by carboplatin/taxol x4  (c) last chemotherapy dose October 2003  (2) status post left breast upper outer quadrant biopsy 01/28/2015 for an invasive ductal carcinoma, grade 2, estrogen and progesterone receptor positive, HER-2 not amplified, with an MIB-1 of 10%  (a) tamoxifen started neoadjuvantly due to concerns regarding surgical delay, interrupted with chemotherapy  (3) status post bilateral mastectomies with left axillary lymph node sampling 03/18/2015 showing  (a) on the right, benign disease  (b) on the left, a pT1c pN0, stage IA invasive ductal carcinoma, repeat HER-2 again negative, stage IA    (4) Oncotype DX score of 29, in the high intermediate range, predicts a risk of recurrence with tamoxifen alone of 19%. The addition of chemotherapy in this setting is predicted to decrease distant disease-free recurrence by approximately 6%.   (5) adjuvant chemotherapy with  cyclophosphamide and docetaxel started 04/22/2015, repeated every 21 days 4  (a) gemcitabine switched for docetaxel for cycles 3 and 4 due to rash.  (b) chemotherapy completed 06/24/2015  (6) adjuvant anastrozole started 07/29/2015  (a) bone density at [facility] 06/09/2014 shows a T score of -2.4; this is increased from prior  PLAN: Sharlisa gets extra credit for pushing through the initial discomfort with a rim index and getting to a more stable place. She is now tolerating it well. I suspect she will have little difficulty completing 5 years on this medication.  We do have to worry about bone density issues. She will be due for repeat bone density May 2018 and I have entered those orders. If there has been a significant loss, we will consider bisphosphonates or denosumab.   Today I encouraged her to start vitamin D 1000 units daily. She is already on an excellent walking program. I also gave her information on [facility].  Finally I made her aware of our intimacy and pelvic health program in case she wishes to participate.  She will be seeing her surgeon some time in 3 months or so. She will see me again in late May or early June of next year, after her next bone density.  She knows to call for any problems that may develop before that visit.  Chauncey Cruel, MD   06/20/2016 7:32 AM

## 2016-06-28 ENCOUNTER — Telehealth: Payer: Self-pay

## 2016-06-28 NOTE — Telephone Encounter (Signed)
Spoke with pt by phone regarding need for bone density prior to seeing Dr Jana Hakim in July.  Pt states she would prefer to call and schedule appt herself.  Order has been faxed to [facility]

## 2016-07-15 ENCOUNTER — Other Ambulatory Visit (HOSPITAL_BASED_OUTPATIENT_CLINIC_OR_DEPARTMENT_OTHER): Payer: BC Managed Care – PPO

## 2016-07-15 DIAGNOSIS — Z1509 Genetic susceptibility to other malignant neoplasm: Secondary | ICD-10-CM

## 2016-07-15 DIAGNOSIS — Z1501 Genetic susceptibility to malignant neoplasm of breast: Secondary | ICD-10-CM

## 2016-07-15 DIAGNOSIS — C569 Malignant neoplasm of unspecified ovary: Secondary | ICD-10-CM

## 2016-07-15 DIAGNOSIS — C50412 Malignant neoplasm of upper-outer quadrant of left female breast: Secondary | ICD-10-CM | POA: Diagnosis not present

## 2016-07-15 DIAGNOSIS — Z170 Estrogen receptor positive status [ER+]: Secondary | ICD-10-CM

## 2016-07-15 LAB — COMPREHENSIVE METABOLIC PANEL
ALT: 19 U/L (ref 0–55)
AST: 22 U/L (ref 5–34)
Albumin: 3.8 g/dL (ref 3.5–5.0)
Alkaline Phosphatase: 103 U/L (ref 40–150)
Anion Gap: 9 mEq/L (ref 3–11)
BUN: 14.8 mg/dL (ref 7.0–26.0)
CO2: 25 mEq/L (ref 22–29)
Calcium: 9.9 mg/dL (ref 8.4–10.4)
Chloride: 108 mEq/L (ref 98–109)
Creatinine: 0.8 mg/dL (ref 0.6–1.1)
EGFR: 76 mL/min/{1.73_m2} — ABNORMAL LOW (ref >90)
Glucose: 94 mg/dl (ref 70–140)
Potassium: 4.2 mEq/L (ref 3.5–5.1)
Sodium: 142 mEq/L (ref 136–145)
Total Bilirubin: 0.39 mg/dL (ref 0.20–1.20)
Total Protein: 6.6 g/dL (ref 6.4–8.3)

## 2016-07-15 LAB — CBC WITH DIFFERENTIAL/PLATELET
BASO%: 1.7 % (ref 0.0–2.0)
Basophils Absolute: 0.1 10*3/uL (ref 0.0–0.1)
EOS%: 10.9 % — ABNORMAL HIGH (ref 0.0–7.0)
Eosinophils Absolute: 0.5 10*3/uL (ref 0.0–0.5)
HCT: 41.8 % (ref 34.8–46.6)
HGB: 14 g/dL (ref 11.6–15.9)
LYMPH%: 24 % (ref 14.0–49.7)
MCH: 29.6 pg (ref 25.1–34.0)
MCHC: 33.5 g/dL (ref 31.5–36.0)
MCV: 88.2 fL (ref 79.5–101.0)
MONO#: 0.5 10*3/uL (ref 0.1–0.9)
MONO%: 11.7 % (ref 0.0–14.0)
NEUT#: 2.4 10*3/uL (ref 1.5–6.5)
NEUT%: 51.7 % (ref 38.4–76.8)
Platelets: 288 10*3/uL (ref 145–400)
RBC: 4.74 10*6/uL (ref 3.70–5.45)
RDW: 13.2 % (ref 11.2–14.5)
WBC: 4.7 10*3/uL (ref 3.9–10.3)
lymph#: 1.1 10*3/uL (ref 0.9–3.3)

## 2016-07-16 LAB — VITAMIN D 25 HYDROXY (VIT D DEFICIENCY, FRACTURES): Vitamin D, 25-Hydroxy: 36.9 ng/mL (ref 30.0–100.0)

## 2016-07-27 ENCOUNTER — Ambulatory Visit (HOSPITAL_BASED_OUTPATIENT_CLINIC_OR_DEPARTMENT_OTHER): Payer: BC Managed Care – PPO | Admitting: Oncology

## 2016-07-27 VITALS — BP 152/44 | HR 65 | Temp 97.8°F | Resp 17 | Ht 67.0 in | Wt 179.1 lb

## 2016-07-27 DIAGNOSIS — G620 Drug-induced polyneuropathy: Secondary | ICD-10-CM

## 2016-07-27 DIAGNOSIS — C50412 Malignant neoplasm of upper-outer quadrant of left female breast: Secondary | ICD-10-CM | POA: Diagnosis not present

## 2016-07-27 DIAGNOSIS — C569 Malignant neoplasm of unspecified ovary: Secondary | ICD-10-CM

## 2016-07-27 DIAGNOSIS — Z8543 Personal history of malignant neoplasm of ovary: Secondary | ICD-10-CM | POA: Diagnosis not present

## 2016-07-27 DIAGNOSIS — Z421 Encounter for breast reconstruction following mastectomy: Secondary | ICD-10-CM | POA: Diagnosis not present

## 2016-07-27 DIAGNOSIS — Z170 Estrogen receptor positive status [ER+]: Secondary | ICD-10-CM | POA: Diagnosis not present

## 2016-07-27 DIAGNOSIS — Z1501 Genetic susceptibility to malignant neoplasm of breast: Secondary | ICD-10-CM

## 2016-07-27 DIAGNOSIS — Z1509 Genetic susceptibility to other malignant neoplasm: Secondary | ICD-10-CM

## 2016-07-27 MED ORDER — GABAPENTIN 300 MG PO CAPS: 300.0000 mg | ORAL_CAPSULE | Freq: Every day | ORAL | 4 refills | Status: DC

## 2016-07-27 MED ORDER — ANASTROZOLE 1 MG PO TABS: 1.0000 mg | ORAL_TABLET | Freq: Every day | ORAL | 4 refills | Status: DC

## 2016-07-27 NOTE — Progress Notes (Signed)
[place]  [phone] [phone]   ID: Vicki Hale DOB: [birth year]  MR#: [id number]  JKK#:[id number]  Patient Care Team: Leonides Sake, MD as PCP - General (Family Medicine) Vicki Hale, Virgie Dad, MD as Consulting Physician (Oncology) Rolm Bookbinder, MD as Consulting Physician (General Surgery) Consuella Lose, MD as Consulting Physician (Neurosurgery) Christene Slates, MD as Physician Assistant (Radiology) Rosalin Hawking, MD as Consulting Physician (Neurology) PCP: Leonides Sake, MD GYN: OTHER MD:  CHIEF COMPLAINT: Breast cancer in BRCA carrier  CURRENT TREATMENT: Anastrozole  CANCER HISTORY: From the original intake note:  Vicki Hale was diagnosed with ovarian cancer in April 2003. She underwent total abdominal hysterectomy and bilateral salpingo-oophorectomy with a full staging operation at [facility] that month and was then treated according to GOG 182, with 4 cycles of carboplatin/topotecan and then 4 cycles of carboplatin/paclitaxel. She was then followed by gynecologic oncology through 2011, and there has been no history of recurrent disease.  On 71/69/6789 Milinda underwent screening mammography at [facility] with tomosynthesis. Breast density was category A. New grouped calcifications were noted in the left breast upper outer quadrant and the patient was recalled for diagnostic unilateral left mammography 01/26/2015. This confirmed a group of new linear calcifications in the upper outer quadrant of the left breast extending over an area of 2.0 cm..   On 01/28/2015 the patient underwent biopsy of her left breast, with the pathology ([id number]) showing an invasive ductal carcinoma, grade 2, estrogen receptor 100% positive, progesterone receptor 50% positive, both with strong staining intensity, with an MIB-1 of 10% and no HER-2 amplification, the signals ratio being 1.21 and the number per cell 2.00.  Her subsequent history is as detailed  below  INTERVAL HISTORY: Vicki Hale returns today for follow-up and treatment of her estrogen receptor positive breast cancer. She continues on anastrozole, with good tolerance.   Hot flashes and vaginal dryness are not a major issue. She never developed the arthralgias or myalgias that many patients can experience on this medication. She obtains it at a good price.  REVIEW OF SYSTEMS: Karlena is very concerned because there have been some changes in her left reconstructed breast, medially. She has undergone several procedures there. She has had some of the fat grafting die off. She thinks there have been changes they are not explained by that however. In addition she still has significant neuropathy at night. This is actually worse. It keeps her from going to sleep sometimes. Aside from this issue a detailed review of systems was otherwise stable   Past Medical History:  Diagnosis Date  . Arthritis   . Breast cancer ([place]) 2017   [facility] in [place]  . Family history of breast cancer   . Family history of pancreatic cancer   . FH: BRCA1 gene positive   . Ovarian cancer ([place])   . Pericarditis, acute    30 YRS AGO   . SAH (subarachnoid hemorrhage) ([place])    04/2014  . Stroke [facility])    no deficits    PAST SURGICAL HISTORY: Past Surgical History:  Procedure Laterality Date  . BREAST RECONSTRUCTION WITH PLACEMENT OF TISSUE EXPANDER AND FLEX HD (ACELLULAR HYDRATED DERMIS) Bilateral 03/18/2015   Procedure: BILATERAL BREAST RECONSTRUCTION WITH PLACEMENT OF TISSUE EXPANDER AND FLEX HD (ACELLULAR HYDRATED DERMIS);  Surgeon: Loel Lofty Dillingham, DO;  Location: [place];  Service: Plastics;  Laterality: Bilateral;  . CESAREAN SECTION     X2  . HERNIA REPAIR    . LIPOSUCTION WITH LIPOFILLING Bilateral  12/02/2015   Procedure: LIPOSUCTION WITH LIPOFILLING FROM ABDOMEN TO BILATERAL BREAST;  Surgeon: [name], DO;  Location: [place];  Service: Plastics;  Laterality: Bilateral;  . LIPOSUCTION  WITH LIPOFILLING Bilateral 03/23/2016   Procedure: BILATERAL FAT GRAFTING;  Surgeon: [name], DO;  Location: [place];  Service: Plastics;  Laterality: Bilateral;  . MASTECTOMY W/ SENTINEL NODE BIOPSY Left 03/18/2015   Procedure: LEFT TOTAL MASTECTOMY WITH LEFT AXILLARY SENTINEL LYMPH NODE BIOPSY;  Surgeon: Excell Seltzer, MD;  Location: [place];  Service: General;  Laterality: Left;  Marland Kitchen MASTECTOMY, PARTIAL Right 03/18/2015   Procedure: RIGHT PROPHYLACTIC MASTECTOMY ;  Surgeon: Excell Seltzer, MD;  Location: [place];  Service: General;  Laterality: Right;  . REMOVAL OF BILATERAL TISSUE EXPANDERS WITH PLACEMENT OF BILATERAL BREAST IMPLANTS Bilateral 08/12/2015   Procedure: REMOVAL OF BILATERAL TISSUE EXPANDERS WITH PLACEMENT OF BILATERAL BREAST IMPLANTS;  Surgeon: [name], DO;  Location: [place];  Service: Plastics;  Laterality: Bilateral;  . TONSILLECTOMY    . TOTAL ABDOMINAL HYSTERECTOMY W/ BILATERAL SALPINGOOPHORECTOMY      FAMILY HISTORY Family History  Problem Relation Age of Onset  . Colon polyps Mother   . Breast cancer Daughter 51  . BRCA 1/2 Daughter        BRCA1  . COPD Maternal Aunt   . Esophageal cancer Maternal Uncle   . Lung cancer Paternal Aunt   . Pancreatic cancer Maternal Grandmother        early 25s  . Prostate cancer Maternal Grandfather        8s  . Skin cancer Paternal Grandmother        untreated BCC  . COPD Maternal Aunt   . Lung cancer Maternal Aunt   . Breast cancer Cousin 63       maternal first cousin  . BRCA 1/2 Daughter        BRCA1  . Esophageal cancer Cousin        maternal first cousin  . Bladder Cancer Paternal Aunt   . Colon cancer Neg Hx   . Rectal cancer Neg Hx   . Ulcerative colitis Neg Hx   . Stomach cancer Neg Hx    the patient's 2 daughters have been tested for the BRCA gene and both are positive. One of them had breast cancer at the age of 67. In addition on the maternal side there  is pancreatic prostate breast and esophageal cancer. The patient's father died at the age of 39 and a car accident and there is very little information on his side of the family. The patient's mother died at 9 from COPD. She had had a hysterectomy in her 88s. The patient's mother had 5 siblings several from died young. The patient herself had no sisters, 2 brothers.  GYNECOLOGIC HISTORY:  No LMP recorded. Patient has had a hysterectomy.  Menarche age 58, first live birth age 79, the patient is [place] P2. She underwent TAH/BSO in 2003 for her ovarian cancer. She took hormone replacement for approximately 6 months. She was on oral contraceptives for approximately 4 years remotely, with no complications.  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 Her husband Shanon Brow works for a [employer]. Daughter Sieanna Vanstone lives in [place] where she works as a school principal. She is 69 years old. Daughter Darielle Hancher lives in [place] and is a Pharmacist, hospital. She  is 65 years old. These ages are as of January 2017. The patient has 4 grandchildren. She attends a [employer]    ADVANCED DIRECTIVES: Not in place   HEALTH MAINTENANCE: Social History  Substance Use Topics  . Smoking status: Never Smoker  . Smokeless tobacco: Never Used  . Alcohol use 0.0 oz/week     Comment: ocassional; once monthly     Colonoscopy: September 2016/ [facility]  PAP:  Bone density: 2016  Lipid panel:  Allergies  Allergen Reactions  . Taxotere [Docetaxel] Rash  . Penicillins Itching and Rash    Has patient had a PCN reaction causing immediate rash, facial/tongue/throat swelling, SOB or lightheadedness with hypotension: No Has patient had a PCN reaction causing severe rash involving mucus membranes or skin necrosis: No Has patient had a PCN reaction that required hospitalization No Has patient had a PCN reaction occurring within the last 10 years:  No If all of the above answers are "NO", then may proceed with Cephalosporin use.     Current Outpatient Prescriptions  Medication Sig Dispense Refill  . anastrozole (ARIMIDEX) 1 MG tablet Take 1 tablet (1 mg total) by mouth daily. 90 tablet 4  . Ascorbic Acid (VITAMIN C) 1000 MG tablet Take 1,000 mg by mouth daily.     . B Complex Vitamins (B COMPLEX PO) Take 2 tablets by mouth daily.    Marland Kitchen gabapentin (NEURONTIN) 300 MG capsule Take 1 capsule (300 mg total) by mouth at bedtime. 90 capsule 4  . loratadine (CLARITIN) 10 MG tablet Take 10 mg by mouth daily.    . Multiple Vitamin (MULTIVITAMIN WITH MINERALS) TABS tablet Take 2 tablets by mouth daily.    . Vitamin D, Cholecalciferol, 1000 units CAPS Take by mouth.     No current facility-administered medications for this visit.     OBJECTIVE: Middle-aged white woman In no acute distress  Vitals:   07/27/16 1101  BP: (!) 152/44  Pulse: 65  Resp: 17  Temp: 97.8 F (36.6 C)     Body mass index is 28.05 kg/m.    ECOG FS:1 - Symptomatic but completely ambulatory  Sclerae unicteric, pupils round and equal Oropharynx clear and moist No cervical or supraclavicular adenopathy Lungs no rales or rhonchi Heart regular rate and rhythm Abd soft, nontender, positive bowel sounds MSK no focal spinal tenderness, no upper extremity lymphedema Neuro: nonfocal, well oriented, appropriate affect Breasts: Status post bilateral mastectomies, with bilateral reconstruction. In the medial superior aspect of the left breast in particular other is some irregularities, which are not tender, not associated with overlying skin erythema. Both axillae are benign.   LAB RESULTS:  CMP     Component Value Date/Time   NA 142 07/15/2016 0828   K 4.2 07/15/2016 0828   CL 104 03/18/2015 1653   CO2 25 07/15/2016 0828   GLUCOSE 94 07/15/2016 0828   BUN 14.8 07/15/2016 0828   CREATININE 0.8 07/15/2016 0828   CALCIUM 9.9 07/15/2016 0828   PROT 6.6 07/15/2016  0828   ALBUMIN 3.8 07/15/2016 0828   AST 22 07/15/2016 0828   ALT 19 07/15/2016 0828   ALKPHOS 103 07/15/2016 0828   BILITOT 0.39 07/15/2016 0828   GFRNONAA >60 03/18/2015 1653   GFRAA >60 03/18/2015 1653    INo results found for: SPEP, UPEP  Lab Results  Component Value Date   WBC 4.7 07/15/2016   NEUTROABS 2.4 07/15/2016   HGB 14.0 07/15/2016   HCT 41.8 07/15/2016   MCV 88.2  07/15/2016   PLT 288 07/15/2016      Chemistry      Component Value Date/Time   NA 142 07/15/2016 0828   K 4.2 07/15/2016 0828   CL 104 03/18/2015 1653   CO2 25 07/15/2016 0828   BUN 14.8 07/15/2016 0828   CREATININE 0.8 07/15/2016 0828      Component Value Date/Time   CALCIUM 9.9 07/15/2016 0828   ALKPHOS 103 07/15/2016 0828   AST 22 07/15/2016 0828   ALT 19 07/15/2016 0828   BILITOT 0.39 07/15/2016 0828      No results found for: LABCA2  No components found for: LABCA125  No results for input(s): INR in the last 168 hours.  Urinalysis No results found for: COLORURINE, APPEARANCEUR, LABSPEC, PHURINE, GLUCOSEU, HGBUR, BILIRUBINUR, KETONESUR, PROTEINUR, UROBILINOGEN, NITRITE, LEUKOCYTESUR  STUDIES: Bone density obtained at [facility] 07/08/2016 shows a T score of -2.3, it was -2.42 years ago.  ASSESSMENT: 66 y.o. BRCA1 positive [place], [place] woman  (1) ovarian cancer stage IIIB diagnosed April 2003  (a) s/p supracervical hysterectomy and BSO with surgical staging April 2003  (b) treated according to GOG 182: carboplatin/ topotecan x4 followed by carboplatin/taxol x4  (c) last chemotherapy dose October 2003  (2) status post left breast upper outer quadrant biopsy 01/28/2015 for an invasive ductal carcinoma, grade 2, estrogen and progesterone receptor positive, HER-2 not amplified, with an MIB-1 of 10%  (a) tamoxifen started neoadjuvantly due to concerns regarding surgical delay, interrupted with chemotherapy  (3) status post bilateral mastectomies with left axillary lymph node sampling  03/18/2015 showing  (a) on the right, benign disease  (b) on the left, a pT1c pN0, stage IA invasive ductal carcinoma, repeat HER-2 again negative, stage IA    (4) Oncotype DX score of 29, in the high intermediate range, predicts a risk of recurrence with tamoxifen alone of 19%. The addition of chemotherapy in this setting is predicted to decrease distant disease-free recurrence by approximately 6%.   (5) adjuvant chemotherapy with cyclophosphamide and docetaxel started 04/22/2015, repeated every 21 days 4  (a) gemcitabine switched for docetaxel for cycles 3 and 4 due to rash.  (b) chemotherapy completed 06/24/2015  (6) adjuvant anastrozole started 07/29/2015  (a) bone density at [facility] 06/09/2014 shows a T score of -2.4; this is increased from prior  (b) bone density at [facility] 07/08/2016 shows a T score of -2.3, which is stable  PLAN: Vicki Hale Is now a little over a year out from definitive surgery for her breast cancer with no evidence of disease recurrence. This is favorable.  She is tolerating the anastrozole well, and the plan will be to continue that to a total of 5 years.  Her bone density does show near osteoporosis. However it is stable. The plan is to continue vitamin D supplementation and weightbearing exercise.  I am concerned about some of the changes in her left medial breast. I am setting her up for a breast MRI. That should also help her plastic surgeon as they plan further interventions.  I do not know why she would have worsening peripheral neuropathy at this point. I do think she may benefit from gabapentin at bedtime and I wrote her that prescription. She has a good understanding of the possible toxicities side effects and complications of that agent.  Otherwise Vicki Hale will see me again a year from now. She knows to call for any problems that may develop before that visit.  Chauncey Cruel, MD   07/27/2016 12:56 PM

## 2016-08-22 ENCOUNTER — Other Ambulatory Visit: Payer: BC Managed Care – PPO

## 2017-07-12 ENCOUNTER — Ambulatory Visit (INDEPENDENT_AMBULATORY_CARE_PROVIDER_SITE_OTHER): Payer: Medicare Other | Admitting: Orthopaedic Surgery

## 2017-07-12 ENCOUNTER — Ambulatory Visit (INDEPENDENT_AMBULATORY_CARE_PROVIDER_SITE_OTHER): Payer: Self-pay

## 2017-07-12 ENCOUNTER — Encounter (INDEPENDENT_AMBULATORY_CARE_PROVIDER_SITE_OTHER): Payer: Self-pay | Admitting: Orthopaedic Surgery

## 2017-07-12 DIAGNOSIS — M25562 Pain in left knee: Secondary | ICD-10-CM

## 2017-07-12 NOTE — Progress Notes (Signed)
Office Visit Note   Patient: Vicki Hale           Date of Birth: 1950/04/28           MRN: [id number] Visit Date: 07/12/2017              Requested by: Leonides Sake, [place], [place] [address] PCP: Patient, No Pcp Per   Assessment & Plan: Visit Diagnoses:  1. Acute pain of left knee     Plan: Impression is left knee pain and effusion worrisome for internal derangement.  45 cc of blood were aspirated from the knee today.  Compression wrap was applied.  We ordered an MRI of the left knee to rule out structural abnormalities.  Follow-up after the MRI.  Follow-Up Instructions: Return in about 10 days (around 07/22/2017).   Orders:  Orders Placed This Encounter  Procedures  . XR KNEE 3 VIEW LEFT  . MR Knee Left w/o contrast   No orders of the defined types were placed in this encounter.     Procedures: Large Joint Inj: L knee on 07/12/2017 6:42 PM Details: 22 G needle Aspirate: 45 mL bloody Outcome: tolerated well, no immediate complications Patient was prepped and draped in the usual sterile fashion.       Clinical Data: No additional findings.   Subjective: Chief Complaint  Patient presents with  . Left Knee - Pain    Patient is a 67 year old female comes in with left knee pain that recently has gotten worse since Sunday.  She stepped wrong while walking and later developed pain and swelling.  Has gotten slightly better since Sunday but she continues to have significant pain and swelling especially with flexion of the knee.  Denies any numbness or tingling.   Review of Systems  Constitutional: Negative.   HENT: Negative.   Eyes: Negative.   Respiratory: Negative.   Cardiovascular: Negative.   Endocrine: Negative.   Musculoskeletal: Negative.   Neurological: Negative.   Hematological: Negative.   Psychiatric/Behavioral: Negative.   All other systems reviewed and are negative.    Objective: Vital Signs: There were no vitals  taken for this visit.  Physical Exam  Constitutional: She is oriented to person, place, and time. She appears well-developed and well-nourished.  HENT:  Head: Normocephalic and atraumatic.  Eyes: EOM are normal.  Neck: Neck supple.  Pulmonary/Chest: Effort normal.  Abdominal: Soft.  Neurological: She is alert and oriented to person, place, and time.  Skin: Skin is warm. Capillary refill takes less than 2 seconds.  Psychiatric: She has a normal mood and affect. Her behavior is normal. Judgment and thought content normal.  Nursing note and vitals reviewed.   Ortho Exam Left knee exam shows large joint effusion.  Significant medial joint line tenderness.  Collaterals and cruciates are stable. Specialty Comments:  No specialty comments available.  Imaging: Xr Knee 3 View Left  Result Date: 07/12/2017 Advanced patellofemoral compartment disease with moderate medial and lateral compartment degenerative joint disease.    PMFS History: Patient Active Problem List   Diagnosis Date Noted  . Hypersensitivity reaction 05/15/2015  . BRCA1 positive 05/11/2015  . Genetic testing 02/19/2015  . Ovarian cancer, BRCA1 positive ([place]) 02/13/2015  . Malignant neoplasm of upper-outer quadrant of left breast in female, estrogen receptor positive ([place])   . Family history of breast cancer   . FH: BRCA1 gene positive   . Family history of pancreatic cancer   . SAH (subarachnoid hemorrhage) (  [place]) 05/12/2014   Past Medical History:  Diagnosis Date  . Arthritis   . Breast cancer ([place]) 2017   [facility] in [place]  . Family history of breast cancer   . Family history of pancreatic cancer   . FH: BRCA1 gene positive   . Ovarian cancer ([place])   . Pericarditis, acute    30 YRS AGO   . SAH (subarachnoid hemorrhage) ([place])    04/2014  . Stroke [facility])    no deficits    Family History  Problem Relation Age of Onset  . Colon polyps Mother   . Breast cancer Daughter 41  . BRCA 1/2 Daughter        BRCA1  .  COPD Maternal Aunt   . Esophageal cancer Maternal Uncle   . Lung cancer Paternal Aunt   . Pancreatic cancer Maternal Grandmother        early 59s  . Prostate cancer Maternal Grandfather        76s  . Skin cancer Paternal Grandmother        untreated BCC  . COPD Maternal Aunt   . Lung cancer Maternal Aunt   . Breast cancer Cousin 2       maternal first cousin  . BRCA 1/2 Daughter        BRCA1  . Esophageal cancer Cousin        maternal first cousin  . Bladder Cancer Paternal Aunt   . Colon cancer Neg Hx   . Rectal cancer Neg Hx   . Ulcerative colitis Neg Hx   . Stomach cancer Neg Hx     Past Surgical History:  Procedure Laterality Date  . BREAST RECONSTRUCTION WITH PLACEMENT OF TISSUE EXPANDER AND FLEX HD (ACELLULAR HYDRATED DERMIS) Bilateral 03/18/2015   Procedure: BILATERAL BREAST RECONSTRUCTION WITH PLACEMENT OF TISSUE EXPANDER AND FLEX HD (ACELLULAR HYDRATED DERMIS);  Surgeon: Loel Lofty Dillingham, DO;  Location: [place];  Service: Plastics;  Laterality: Bilateral;  . CESAREAN SECTION     X2  . HERNIA REPAIR    . LIPOSUCTION WITH LIPOFILLING Bilateral 12/02/2015   Procedure: LIPOSUCTION WITH LIPOFILLING FROM ABDOMEN TO BILATERAL BREAST;  Surgeon: [name], DO;  Location: [place];  Service: Plastics;  Laterality: Bilateral;  . LIPOSUCTION WITH LIPOFILLING Bilateral 03/23/2016   Procedure: BILATERAL FAT GRAFTING;  Surgeon: [name], DO;  Location: [place];  Service: Plastics;  Laterality: Bilateral;  . MASTECTOMY W/ SENTINEL NODE BIOPSY Left 03/18/2015   Procedure: LEFT TOTAL MASTECTOMY WITH LEFT AXILLARY SENTINEL LYMPH NODE BIOPSY;  Surgeon: Excell Seltzer, MD;  Location: [place];  Service: General;  Laterality: Left;  Marland Kitchen MASTECTOMY, PARTIAL Right 03/18/2015   Procedure: RIGHT PROPHYLACTIC MASTECTOMY ;  Surgeon: Excell Seltzer, MD;  Location: [place];  Service: General;  Laterality: Right;  . REMOVAL OF BILATERAL TISSUE  EXPANDERS WITH PLACEMENT OF BILATERAL BREAST IMPLANTS Bilateral 08/12/2015   Procedure: REMOVAL OF BILATERAL TISSUE EXPANDERS WITH PLACEMENT OF BILATERAL BREAST IMPLANTS;  Surgeon: [name], DO;  Location: [place];  Service: Plastics;  Laterality: Bilateral;  . TONSILLECTOMY    . TOTAL ABDOMINAL HYSTERECTOMY W/ BILATERAL SALPINGOOPHORECTOMY     Social History   Occupational History  . Not on file  Tobacco Use  . Smoking status: Never Smoker  . Smokeless tobacco: Never Used  Substance and Sexual Activity  . Alcohol use: Yes    Alcohol/week: 0.0 oz    Comment: ocassional; once monthly  . Drug  use: No  . Sexual activity: Not on file

## 2017-07-17 ENCOUNTER — Telehealth (INDEPENDENT_AMBULATORY_CARE_PROVIDER_SITE_OTHER): Payer: Self-pay | Admitting: Orthopaedic Surgery

## 2017-07-17 NOTE — Telephone Encounter (Signed)
Pt husband called has questions of about MRI waiting to be be sched

## 2017-07-17 NOTE — Telephone Encounter (Signed)
See message.

## 2017-07-18 NOTE — Telephone Encounter (Signed)
appt scheduled 07/19/17 at 3p

## 2017-07-19 ENCOUNTER — Ambulatory Visit
Admission: RE | Admit: 2017-07-19 | Discharge: 2017-07-19 | Disposition: A | Discharge status: Home / Self Care | Payer: Medicare Other | Source: Ambulatory Visit | Attending: Orthopaedic Surgery | Admitting: Orthopaedic Surgery

## 2017-07-19 DIAGNOSIS — M25562 Pain in left knee: Secondary | ICD-10-CM

## 2017-07-19 DIAGNOSIS — M23322 Other meniscus derangements, posterior horn of medial meniscus, left knee: Secondary | ICD-10-CM | POA: Diagnosis not present

## 2017-07-19 DIAGNOSIS — R600 Localized edema: Secondary | ICD-10-CM | POA: Diagnosis not present

## 2017-07-26 ENCOUNTER — Encounter (INDEPENDENT_AMBULATORY_CARE_PROVIDER_SITE_OTHER): Payer: Self-pay | Admitting: Ophthalmology

## 2017-07-26 ENCOUNTER — Encounter (INDEPENDENT_AMBULATORY_CARE_PROVIDER_SITE_OTHER): Payer: Self-pay | Admitting: Orthopaedic Surgery

## 2017-07-26 ENCOUNTER — Ambulatory Visit (INDEPENDENT_AMBULATORY_CARE_PROVIDER_SITE_OTHER): Payer: Medicare Other | Admitting: Ophthalmology

## 2017-07-26 ENCOUNTER — Ambulatory Visit (INDEPENDENT_AMBULATORY_CARE_PROVIDER_SITE_OTHER): Payer: Medicare Other | Admitting: Orthopaedic Surgery

## 2017-07-26 DIAGNOSIS — M25562 Pain in left knee: Secondary | ICD-10-CM

## 2017-07-26 DIAGNOSIS — H25813 Combined forms of age-related cataract, bilateral: Secondary | ICD-10-CM | POA: Diagnosis not present

## 2017-07-26 DIAGNOSIS — H33321 Round hole, right eye: Secondary | ICD-10-CM

## 2017-07-26 DIAGNOSIS — H3581 Retinal edema: Secondary | ICD-10-CM | POA: Diagnosis not present

## 2017-07-26 DIAGNOSIS — H21541 Posterior synechiae (iris), right eye: Secondary | ICD-10-CM | POA: Diagnosis not present

## 2017-07-26 DIAGNOSIS — H2513 Age-related nuclear cataract, bilateral: Secondary | ICD-10-CM | POA: Diagnosis not present

## 2017-07-26 DIAGNOSIS — H209 Unspecified iridocyclitis: Secondary | ICD-10-CM

## 2017-07-26 DIAGNOSIS — H2000 Unspecified acute and subacute iridocyclitis: Secondary | ICD-10-CM

## 2017-07-26 DIAGNOSIS — H20011 Primary iridocyclitis, right eye: Secondary | ICD-10-CM | POA: Diagnosis not present
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
## 2017-07-26 NOTE — Progress Notes (Signed)
Office Visit Note   Patient: Vicki Hale           Date of Birth: 03-29-1950           MRN: [id number] Visit Date: 07/26/2017              Requested by: No referring provider defined for this encounter. PCP: Patient, No Pcp Per   Assessment & Plan: Visit Diagnoses:  1. Acute pain of left knee     Plan: Findings are consistent with advanced degenerative joint disease of the patellofemoral compartment with mild disease of the lateral compartment.  She has a small area of signal in the medial meniscus which does not appear to be real tear.  Clinically speaking she is doing very well.  I did offer her injection but since she is feeling pretty good she declined.  I think this is very reasonable.  I do recommend not doing any walking exercises for 4 to 6 weeks until she is able to fully feel better.  I would like to recheck her in about 4 to 6 weeks and if she is doing well release her to full activity at that time.  Follow-Up Instructions: Return for 4-6 weeks.   Orders:  No orders of the defined types were placed in this encounter.  No orders of the defined types were placed in this encounter.     Procedures: No procedures performed   Clinical Data: No additional findings.   Subjective: Chief Complaint  Patient presents with  . Left Knee - Pain, Follow-up    Vicki Hale follows up today for MRI she denies any significant pain.  She states that she has had significant improvement in pain.  She is ambulating well.  She  does not feel that she needs the knee brace.   Review of Systems   Objective: Vital Signs: There were no vitals taken for this visit.  Physical Exam  Ortho Exam Left knee exam shows trace joint effusion.  Otherwise unremarkable. Specialty Comments:  No specialty comments available.  Imaging: No results found.   PMFS History: Patient Active Problem List   Diagnosis Date Noted  . Hypersensitivity reaction 05/15/2015  . BRCA1 positive  05/11/2015  . Genetic testing 02/19/2015  . Ovarian cancer, BRCA1 positive ([place]) 02/13/2015  . Malignant neoplasm of upper-outer quadrant of left breast in female, estrogen receptor positive ([place])   . Family history of breast cancer   . FH: BRCA1 gene positive   . Family history of pancreatic cancer   . SAH (subarachnoid hemorrhage) ([place]) 05/12/2014   Past Medical History:  Diagnosis Date  . Arthritis   . Breast cancer ([place]) 2017   [facility] in [place]  . Family history of breast cancer   . Family history of pancreatic cancer   . FH: BRCA1 gene positive   . Ovarian cancer ([place])   . Pericarditis, acute    30 YRS AGO   . SAH (subarachnoid hemorrhage) ([place])    04/2014  . Stroke [facility])    no deficits    Family History  Problem Relation Age of Onset  . Colon polyps Mother   . Breast cancer Daughter 75  . BRCA 1/2 Daughter        BRCA1  . COPD Maternal Aunt   . Esophageal cancer Maternal Uncle   . Lung cancer Paternal Aunt   . Pancreatic cancer Maternal Grandmother        early 6s  . Prostate cancer Maternal Grandfather  53s  . Skin cancer Paternal Grandmother        untreated BCC  . COPD Maternal Aunt   . Lung cancer Maternal Aunt   . Breast cancer Cousin 50       maternal first cousin  . BRCA 1/2 Daughter        BRCA1  . Esophageal cancer Cousin        maternal first cousin  . Bladder Cancer Paternal Aunt   . Colon cancer Neg Hx   . Rectal cancer Neg Hx   . Ulcerative colitis Neg Hx   . Stomach cancer Neg Hx     Past Surgical History:  Procedure Laterality Date  . BREAST RECONSTRUCTION WITH PLACEMENT OF TISSUE EXPANDER AND FLEX HD (ACELLULAR HYDRATED DERMIS) Bilateral 03/18/2015   Procedure: BILATERAL BREAST RECONSTRUCTION WITH PLACEMENT OF TISSUE EXPANDER AND FLEX HD (ACELLULAR HYDRATED DERMIS);  Surgeon: Loel Lofty Dillingham, DO;  Location: [place];  Service: Plastics;  Laterality: Bilateral;  . CESAREAN SECTION     X2  . HERNIA REPAIR    . LIPOSUCTION WITH  LIPOFILLING Bilateral 12/02/2015   Procedure: LIPOSUCTION WITH LIPOFILLING FROM ABDOMEN TO BILATERAL BREAST;  Surgeon: [name], DO;  Location: [place];  Service: Plastics;  Laterality: Bilateral;  . LIPOSUCTION WITH LIPOFILLING Bilateral 03/23/2016   Procedure: BILATERAL FAT GRAFTING;  Surgeon: [name], DO;  Location: [employer];  Service: Plastics;  Laterality: Bilateral;  . MASTECTOMY W/ SENTINEL NODE BIOPSY Left 03/18/2015   Procedure: LEFT TOTAL MASTECTOMY WITH LEFT AXILLARY SENTINEL LYMPH NODE BIOPSY;  Surgeon: Excell Seltzer, MD;  Location: [place];  Service: General;  Laterality: Left;  Marland Kitchen MASTECTOMY, PARTIAL Right 03/18/2015   Procedure: RIGHT PROPHYLACTIC MASTECTOMY ;  Surgeon: Excell Seltzer, MD;  Location: [place];  Service: General;  Laterality: Right;  . REMOVAL OF BILATERAL TISSUE EXPANDERS WITH PLACEMENT OF BILATERAL BREAST IMPLANTS Bilateral 08/12/2015   Procedure: REMOVAL OF BILATERAL TISSUE EXPANDERS WITH PLACEMENT OF BILATERAL BREAST IMPLANTS;  Surgeon: [name], DO;  Location: [place];  Service: Plastics;  Laterality: Bilateral;  . TONSILLECTOMY    . TOTAL ABDOMINAL HYSTERECTOMY W/ BILATERAL SALPINGOOPHORECTOMY     Social History   Occupational History  . Not on file  Tobacco Use  . Smoking status: Never Smoker  . Smokeless tobacco: Never Used  Substance and Sexual Activity  . Alcohol use: Yes    Alcohol/week: 0.0 oz    Comment: ocassional; once monthly  . Drug use: No  . Sexual activity: Not on file

## 2017-07-26 NOTE — Progress Notes (Signed)
[place]  [phone] [phone]   ID: Vicki Hale DOB: [birth year]  MR#: [id number]  BVA#:[id number]  Patient Care Team: Leonides Sake, MD as PCP - General (Family Medicine) [name], Virgie Dad, MD as Consulting Physician (Oncology) Rolm Bookbinder, MD as Consulting Physician (General Surgery) Consuella Lose, MD as Consulting Physician (Neurosurgery) Christene Slates, MD as Physician Assistant (Radiology) Rosalin Hawking, MD as Consulting Physician (Neurology) Bernarda Caffey, MD as Consulting Physician (Ophthalmology) Clent Jacks, MD as Consulting Physician (Ophthalmology) PCP: Leonides Sake, MD GYN: OTHER MD:  CHIEF COMPLAINT: Breast cancer in BRCA carrier  CURRENT TREATMENT: Anastrozole  CANCER HISTORY: From the original intake note:  Vicki Hale was diagnosed with ovarian cancer in April 2003. She underwent total abdominal hysterectomy and bilateral salpingo-oophorectomy with a full staging operation at [facility] that month and was then treated according to GOG 182, with 4 cycles of carboplatin/topotecan and then 4 cycles of carboplatin/paclitaxel. She was then followed by gynecologic oncology through 2011, and there has been no history of recurrent disease.  On 66/43/8887 P[place]an underwent screening mammography at [facility] with tomosynthesis. Breast density was category A. New grouped calcifications were noted in the left breast upper outer quadrant and the patient was recalled for diagnostic unilateral left mammography 01/26/2015. This confirmed a group of new linear calcifications in the upper outer quadrant of the left breast extending over an area of 2.0 cm..   On 01/28/2015 the patient underwent biopsy of her left breast, with the pathology ([id number]) showing an invasive ductal carcinoma, grade 2, estrogen receptor 100% positive, progesterone receptor 50% positive, both with strong staining intensity, with an MIB-1 of 10% and no HER-2  amplification, the signals ratio being 1.21 and the number per cell 2.00.  Her subsequent history is as detailed below  INTERVAL HISTORY: Vicki Hale returns today for follow-up and treatment of her estrogen receptor positive breast cancer.  She continues on anastrozole, with good tolerance. She denies hot flashes. She notes increased aches that she exercises to aid in relieving this.    REVIEW OF SYSTEMS: Donella reports that she notes that she hasn't been able to exercise due to arthritis to her bilateral knees. She has had left knee pain following going to [employer] and noticed the pain 1.5 weeks after. She notes that she stepped wrong and had immediate left knee swelling with an aspiration of over 150 cc's and she is being evaluated by Dr. Erlinda Hong for this. She has a torn retina to her right eye and she found out about this on this week. She had pain, swelling, redness, and a gritty sensation, she flushed her eyes and used OTC Visine with relief. She notes that on the onset of her symptoms yesterday, she was unable to see and that is what prompted her to be evaluated by her eye specialist, Dr. Roland Rack. She was seen by Retinal Specialist, Dr. Coralyn Pear and had atropine drops placed yesterday and prednisone drops every hour and atropine drops BID until she will have surgery Monday, 7/15. She notes that her vision improved on yesterday following use of the drops. She notes that she had increased dizziness in the past that she treated with dramamine. She denies unusual headaches, visual changes, nausea, vomiting, or dizziness. There has been no unusual cough, phlegm production, or pleurisy. This been no change in bowel or bladder habits. She denies unexplained fatigue or unexplained weight loss, bleeding, rash, or fever. A detailed review of systems was otherwise stable.  Past Medical History:  Diagnosis Date  . Arthritis   . Breast cancer ([place]) 2017   [facility] in [place]  . Family history of breast cancer     . Family history of pancreatic cancer   . FH: BRCA1 gene positive   . Ovarian cancer ([place])   . Pericarditis, acute    30 YRS AGO   . SAH (subarachnoid hemorrhage) ([place])    04/2014  . Stroke [facility])    no deficits    PAST SURGICAL HISTORY: Past Surgical History:  Procedure Laterality Date  . BREAST RECONSTRUCTION WITH PLACEMENT OF TISSUE EXPANDER AND FLEX HD (ACELLULAR HYDRATED DERMIS) Bilateral 03/18/2015   Procedure: BILATERAL BREAST RECONSTRUCTION WITH PLACEMENT OF TISSUE EXPANDER AND FLEX HD (ACELLULAR HYDRATED DERMIS);  Surgeon: Loel Lofty Dillingham, DO;  Location: [place];  Service: Plastics;  Laterality: Bilateral;  . CESAREAN SECTION     X2  . HERNIA REPAIR    . LIPOSUCTION WITH LIPOFILLING Bilateral 12/02/2015   Procedure: LIPOSUCTION WITH LIPOFILLING FROM ABDOMEN TO BILATERAL BREAST;  Surgeon: [name], DO;  Location: [place];  Service: Plastics;  Laterality: Bilateral;  . LIPOSUCTION WITH LIPOFILLING Bilateral 03/23/2016   Procedure: BILATERAL FAT GRAFTING;  Surgeon: [name], DO;  Location: [place];  Service: Plastics;  Laterality: Bilateral;  . MASTECTOMY W/ SENTINEL NODE BIOPSY Left 03/18/2015   Procedure: LEFT TOTAL MASTECTOMY WITH LEFT AXILLARY SENTINEL LYMPH NODE BIOPSY;  Surgeon: Excell Seltzer, MD;  Location: [place];  Service: General;  Laterality: Left;  Marland Kitchen MASTECTOMY, PARTIAL Right 03/18/2015   Procedure: RIGHT PROPHYLACTIC MASTECTOMY ;  Surgeon: Excell Seltzer, MD;  Location: [place];  Service: General;  Laterality: Right;  . REMOVAL OF BILATERAL TISSUE EXPANDERS WITH PLACEMENT OF BILATERAL BREAST IMPLANTS Bilateral 08/12/2015   Procedure: REMOVAL OF BILATERAL TISSUE EXPANDERS WITH PLACEMENT OF BILATERAL BREAST IMPLANTS;  Surgeon: [name], DO;  Location: [place];  Service: Plastics;  Laterality: Bilateral;  . TONSILLECTOMY    . TOTAL ABDOMINAL HYSTERECTOMY W/ BILATERAL SALPINGOOPHORECTOMY       FAMILY HISTORY Family History  Problem Relation Age of Onset  . Colon polyps Mother   . Breast cancer Daughter 45  . BRCA 1/2 Daughter        BRCA1  . COPD Maternal Aunt   . Esophageal cancer Maternal Uncle   . Lung cancer Paternal Aunt   . Pancreatic cancer Maternal Grandmother        early 20s  . Prostate cancer Maternal Grandfather        84s  . Skin cancer Paternal Grandmother        untreated BCC  . COPD Maternal Aunt   . Lung cancer Maternal Aunt   . Breast cancer Cousin 81       maternal first cousin  . BRCA 1/2 Daughter        BRCA1  . Esophageal cancer Cousin        maternal first cousin  . Bladder Cancer Paternal Aunt   . Colon cancer Neg Hx   . Rectal cancer Neg Hx   . Ulcerative colitis Neg Hx   . Stomach cancer Neg Hx    the patient's 2 daughters have been tested for the BRCA gene and both are positive. One of them had breast cancer at the age of 67. In addition on the maternal side there is pancreatic prostate breast and esophageal cancer. The patient's father died at the age of 71 and  a car accident and there is very little information on his side of the family. The patient's mother died at 46 from COPD. She had had a hysterectomy in her 80s. The patient's mother had 5 siblings several from died young. The patient herself had no sisters, 2 brothers.  GYNECOLOGIC HISTORY:  No LMP recorded. Patient has had a hysterectomy.  Menarche age 71, first live birth age 31, the patient is [place] P2. She underwent TAH/BSO in 2003 for her ovarian cancer. She took hormone replacement for approximately 6 months. She was on oral contraceptives for approximately 4 years remotely, with no complications.  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 Her husband Shanon Brow works for a [employer]. Daughter Vanellope Passmore lives in [place] where she works as a school principal. She is 46 years old. Daughter Teniqua Marron lives in [place] and is a Pharmacist, hospital. She is 66 years old. These ages are as of January 2017. The patient has 4 grandchildren. She attends a [employer]    ADVANCED DIRECTIVES: Not in place   HEALTH MAINTENANCE: Social History   Tobacco Use  . Smoking status: Never Smoker  . Smokeless tobacco: Never Used  Substance Use Topics  . Alcohol use: Yes    Alcohol/week: 0.0 oz    Comment: ocassional; once monthly  . Drug use: No     Colonoscopy: September 2016/ [facility]  PAP:  Bone density: 2016  Lipid panel:  Allergies  Allergen Reactions  . Taxotere [Docetaxel] Rash  . Penicillins Itching and Rash    Has patient had a PCN reaction causing immediate rash, facial/tongue/throat swelling, SOB or lightheadedness with hypotension: No Has patient had a PCN reaction causing severe rash involving mucus membranes or skin necrosis: No Has patient had a PCN reaction that required hospitalization No Has patient had a PCN reaction occurring within the last 10 years: No If all of the above answers are "NO", then may proceed with Cephalosporin use.     Current Outpatient Medications  Medication Sig Dispense Refill  . anastrozole (ARIMIDEX) 1 MG tablet Take 1 tablet (1 mg total) by mouth daily. 90 tablet 4  . Ascorbic Acid (VITAMIN C) 1000 MG tablet Take 1,000 mg by mouth daily.     . B Complex Vitamins (B COMPLEX PO) Take 2 tablets by mouth daily.    Marland Kitchen gabapentin (NEURONTIN) 300 MG capsule Take 1 capsule (300 mg total) by mouth at bedtime. (Patient not taking: Reported on 07/26/2017) 90 capsule 4  . loratadine (CLARITIN) 10 MG tablet Take 10 mg by mouth daily.    . Multiple Vitamin (MULTIVITAMIN WITH MINERALS) TABS tablet Take 2 tablets by mouth daily.    . Vitamin D, Cholecalciferol, 1000 units CAPS Take by mouth.     No current facility-administered medications for this visit.     OBJECTIVE: Middle-aged white woman who appears well  Vitals:   07/27/17 1448  BP: (!) 123/51    Pulse: 61  Resp: 18  Temp: 98.3 F (36.8 C)  SpO2: 100%     Body mass index is 27.47 kg/m.    ECOG FS:1 - Symptomatic but completely ambulatory  Sclera anicteric, right pupil larger than left, EOMs intact No cervical or supraclavicular adenopathy Lungs no rales or rhonchi Heart regular rate and rhythm Abd soft, nontender, positive bowel sounds MSK no focal spinal tenderness, no upper extremity lymphedema Neuro: nonfocal, well oriented, appropriate affect  Breasts: That is post bilateral mastectomies with implant in place.  There is no evidence of chest wall recurrence.  Both axillae are benign.  LAB RESULTS:  CMP     Component Value Date/Time   NA 140 07/27/2017 1428   NA 142 07/15/2016 0828   K 4.5 07/27/2017 1428   K 4.2 07/15/2016 0828   CL 105 07/27/2017 1428   CO2 27 07/27/2017 1428   CO2 25 07/15/2016 0828   GLUCOSE 91 07/27/2017 1428   GLUCOSE 94 07/15/2016 0828   BUN 18 07/27/2017 1428   BUN 14.8 07/15/2016 0828   CREATININE 0.85 07/27/2017 1428   CREATININE 0.8 07/15/2016 0828   CALCIUM 10.3 07/27/2017 1428   CALCIUM 9.9 07/15/2016 0828   PROT 7.3 07/27/2017 1428   PROT 6.6 07/15/2016 0828   ALBUMIN 4.2 07/27/2017 1428   ALBUMIN 3.8 07/15/2016 0828   AST 21 07/27/2017 1428   AST 22 07/15/2016 0828   ALT 18 07/27/2017 1428   ALT 19 07/15/2016 0828   ALKPHOS 109 07/27/2017 1428   ALKPHOS 103 07/15/2016 0828   BILITOT 0.4 07/27/2017 1428   BILITOT 0.39 07/15/2016 0828   GFRNONAA >60 07/27/2017 1428   GFRAA >60 07/27/2017 1428    INo results found for: SPEP, UPEP  Lab Results  Component Value Date   WBC 7.1 07/27/2017   NEUTROABS 4.8 07/27/2017   HGB 13.9 07/27/2017   HCT 41.8 07/27/2017   MCV 89.3 07/27/2017   PLT 315 07/27/2017      Chemistry      Component Value Date/Time   NA 140 07/27/2017 1428   NA 142 07/15/2016 0828   K 4.5 07/27/2017 1428   K 4.2 07/15/2016 0828   CL 105 07/27/2017 1428   CO2 27 07/27/2017 1428   CO2 25  07/15/2016 0828   BUN 18 07/27/2017 1428   BUN 14.8 07/15/2016 0828   CREATININE 0.85 07/27/2017 1428   CREATININE 0.8 07/15/2016 0828      Component Value Date/Time   CALCIUM 10.3 07/27/2017 1428   CALCIUM 9.9 07/15/2016 0828   ALKPHOS 109 07/27/2017 1428   ALKPHOS 103 07/15/2016 0828   AST 21 07/27/2017 1428   AST 22 07/15/2016 0828   ALT 18 07/27/2017 1428   ALT 19 07/15/2016 0828   BILITOT 0.4 07/27/2017 1428   BILITOT 0.39 07/15/2016 0828      No results found for: LABCA2  No components found for: LABCA125  No results for input(s): INR in the last 168 hours.  Urinalysis No results found for: COLORURINE, APPEARANCEUR, LABSPEC, PHURINE, GLUCOSEU, HGBUR, BILIRUBINUR, KETONESUR, PROTEINUR, UROBILINOGEN, NITRITE, LEUKOCYTESUR  STUDIES: Bone density obtained at [facility] 07/08/2016 shows a T score of -2.3, it was -2.42 years ago.  ASSESSMENT: 67 y.o. BRCA1 positive [place], [place] woman  (1) ovarian cancer stage IIIB diagnosed April 2003  (a) s/p supracervical hysterectomy and BSO with surgical staging April 2003  (b) treated according to GOG 182: carboplatin/ topotecan x4 followed by carboplatin/taxol x4  (c) last chemotherapy dose October 2003  (2) status post left breast upper outer quadrant biopsy 01/28/2015 for an invasive ductal carcinoma, grade 2, estrogen and progesterone receptor positive, HER-2 not amplified, with an MIB-1 of 10%  (a) tamoxifen started neoadjuvantly due to concerns regarding surgical delay, interrupted with chemotherapy  (3) status post bilateral mastectomies with left axillary lymph node sampling 03/18/2015 showing  (a) on the right, benign disease  (b) on the left, a pT1c pN0, stage IA invasive ductal carcinoma, repeat  HER-2 again negative, stage IA    (4) Oncotype DX score of 29, in the high intermediate range, predicts a risk of recurrence with tamoxifen alone of 19%. The addition of chemotherapy in this setting is predicted to decrease distant  disease-free recurrence by approximately 6%.   (5) adjuvant chemotherapy with cyclophosphamide and docetaxel started 04/22/2015, repeated every 21 days 4  (a) gemcitabine switched for docetaxel for cycles 3 and 4 due to rash.  (b) chemotherapy completed 06/24/2015  (6) adjuvant anastrozole started 07/29/2015  (a) bone density at [facility] 06/09/2014 shows a T score of -2.4; this is increased from prior  (b) bone density at [facility] 07/08/2016 shows a T score of -2.3, which is stable  PLAN: Amelia is now a little over 2 years out from definitive surgery for her breast cancer with no evidence of disease recurrence.  This is very favorable.  She is tolerating anastrozole well and the plan is to continue that for a total of 5 years.  I reassured her that anastrozole would not interfere with her eye surgery if she needs eye surgery or with her left knee surgery if she ends up needing knee surgery which at this point does not appear to be the case.  Specifically when anastrozole as compared to placebo there is no increase in clotting complications  She will be due for repeat bone density next year  She is very concerned about her daughter and from her report it may be that she might qualify for the SWOG trial.  I gave her the relevant information  Otherwise Sowles will see me again in a year.  She knows to call for any problems that may develop before that visit. [name], Virgie Dad, MD  07/27/17 3:21 PM Medical Oncology and Hematology [facility] [address] [place], [place] [address] Tel. [phone]    Fax. [phone]    I, Soijett Blue am acting as scribe for Dr. Sarajane Jews C. [name].  I, Lurline Del MD, have reviewed the above documentation for accuracy and completeness, and I agree with the above.

## 2017-07-27 ENCOUNTER — Encounter (INDEPENDENT_AMBULATORY_CARE_PROVIDER_SITE_OTHER): Payer: Self-pay | Admitting: Ophthalmology

## 2017-07-27 ENCOUNTER — Inpatient Hospital Stay: Payer: Medicare Other

## 2017-07-27 ENCOUNTER — Telehealth: Payer: Self-pay | Admitting: Oncology

## 2017-07-27 ENCOUNTER — Inpatient Hospital Stay: Payer: Medicare Other | Attending: Oncology | Admitting: Oncology

## 2017-07-27 VITALS — BP 123/51 | HR 61 | Temp 98.3°F | Resp 18 | Ht 67.0 in | Wt 175.4 lb

## 2017-07-27 DIAGNOSIS — Z170 Estrogen receptor positive status [ER+]: Secondary | ICD-10-CM | POA: Diagnosis not present

## 2017-07-27 DIAGNOSIS — Z79899 Other long term (current) drug therapy: Secondary | ICD-10-CM | POA: Insufficient documentation

## 2017-07-27 DIAGNOSIS — Z90710 Acquired absence of both cervix and uterus: Secondary | ICD-10-CM

## 2017-07-27 DIAGNOSIS — Z8673 Personal history of transient ischemic attack (TIA), and cerebral infarction without residual deficits: Secondary | ICD-10-CM

## 2017-07-27 DIAGNOSIS — Z8542 Personal history of malignant neoplasm of other parts of uterus: Secondary | ICD-10-CM | POA: Diagnosis not present

## 2017-07-27 DIAGNOSIS — Z803 Family history of malignant neoplasm of breast: Secondary | ICD-10-CM

## 2017-07-27 DIAGNOSIS — Z1509 Genetic susceptibility to other malignant neoplasm: Secondary | ICD-10-CM

## 2017-07-27 DIAGNOSIS — Z1502 Genetic susceptibility to malignant neoplasm of ovary: Secondary | ICD-10-CM

## 2017-07-27 DIAGNOSIS — Z8543 Personal history of malignant neoplasm of ovary: Secondary | ICD-10-CM | POA: Insufficient documentation

## 2017-07-27 DIAGNOSIS — Z79811 Long term (current) use of aromatase inhibitors: Secondary | ICD-10-CM | POA: Diagnosis not present

## 2017-07-27 DIAGNOSIS — Z1501 Genetic susceptibility to malignant neoplasm of breast: Secondary | ICD-10-CM | POA: Diagnosis not present

## 2017-07-27 DIAGNOSIS — Z90722 Acquired absence of ovaries, bilateral: Secondary | ICD-10-CM | POA: Diagnosis not present

## 2017-07-27 DIAGNOSIS — Z9221 Personal history of antineoplastic chemotherapy: Secondary | ICD-10-CM | POA: Diagnosis not present

## 2017-07-27 DIAGNOSIS — Z800 Family history of malignant neoplasm of digestive organs: Secondary | ICD-10-CM | POA: Diagnosis not present

## 2017-07-27 DIAGNOSIS — C569 Malignant neoplasm of unspecified ovary: Secondary | ICD-10-CM

## 2017-07-27 DIAGNOSIS — Z9013 Acquired absence of bilateral breasts and nipples: Secondary | ICD-10-CM | POA: Insufficient documentation

## 2017-07-27 DIAGNOSIS — M170 Bilateral primary osteoarthritis of knee: Secondary | ICD-10-CM | POA: Diagnosis not present

## 2017-07-27 DIAGNOSIS — C50412 Malignant neoplasm of upper-outer quadrant of left female breast: Secondary | ICD-10-CM

## 2017-07-27 LAB — CBC WITH DIFFERENTIAL/PLATELET
Basophils Absolute: 0 10*3/uL (ref 0.0–0.1)
Basophils Relative: 0 %
Eosinophils Absolute: 0.3 10*3/uL (ref 0.0–0.5)
Eosinophils Relative: 4 %
HCT: 41.8 % (ref 34.8–46.6)
Hemoglobin: 13.9 g/dL (ref 11.6–15.9)
Lymphocytes Relative: 19 %
Lymphs Abs: 1.4 10*3/uL (ref 0.9–3.3)
MCH: 29.7 pg (ref 25.1–34.0)
MCHC: 33.3 g/dL (ref 31.5–36.0)
MCV: 89.3 fL (ref 79.5–101.0)
Monocytes Absolute: 0.6 10*3/uL (ref 0.1–0.9)
Monocytes Relative: 8 %
Neutro Abs: 4.8 10*3/uL (ref 1.5–6.5)
Neutrophils Relative %: 69 %
Platelets: 315 10*3/uL (ref 145–400)
RBC: 4.68 MIL/uL (ref 3.70–5.45)
RDW: 12.9 % (ref 11.2–14.5)
WBC: 7.1 10*3/uL (ref 3.9–10.3)

## 2017-07-27 LAB — COMPREHENSIVE METABOLIC PANEL
ALT: 18 U/L (ref 0–44)
AST: 21 U/L (ref 15–41)
Albumin: 4.2 g/dL (ref 3.5–5.0)
Alkaline Phosphatase: 109 U/L (ref 38–126)
Anion gap: 8 (ref 5–15)
BUN: 18 mg/dL (ref 8–23)
CO2: 27 mmol/L (ref 22–32)
Calcium: 10.3 mg/dL (ref 8.9–10.3)
Chloride: 105 mmol/L (ref 98–111)
Creatinine, Ser: 0.85 mg/dL (ref 0.44–1.00)
GFR calc Af Amer: >60 mL/min (ref >60)
GFR calc non Af Amer: >60 mL/min (ref >60)
Glucose, Bld: 91 mg/dL (ref 70–99)
Potassium: 4.5 mmol/L (ref 3.5–5.1)
Sodium: 140 mmol/L (ref 135–145)
Total Bilirubin: 0.4 mg/dL (ref 0.3–1.2)
Total Protein: 7.3 g/dL (ref 6.5–8.1)

## 2017-07-27 NOTE — Telephone Encounter (Signed)
Gave patient AVS and calendar.  °

## 2017-07-28 ENCOUNTER — Telehealth: Payer: Self-pay | Admitting: Oncology
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
## 2017-07-28 NOTE — Telephone Encounter (Signed)
Per 7/11 los already done

## 2017-07-31 ENCOUNTER — Encounter (INDEPENDENT_AMBULATORY_CARE_PROVIDER_SITE_OTHER): Payer: Self-pay | Admitting: Ophthalmology

## 2017-07-31 ENCOUNTER — Ambulatory Visit (INDEPENDENT_AMBULATORY_CARE_PROVIDER_SITE_OTHER): Payer: Medicare Other | Admitting: Ophthalmology

## 2017-07-31 DIAGNOSIS — H3581 Retinal edema: Secondary | ICD-10-CM

## 2017-07-31 DIAGNOSIS — H33321 Round hole, right eye: Secondary | ICD-10-CM | POA: Diagnosis not present

## 2017-07-31 DIAGNOSIS — H209 Unspecified iridocyclitis: Secondary | ICD-10-CM

## 2017-07-31 DIAGNOSIS — H2000 Unspecified acute and subacute iridocyclitis: Secondary | ICD-10-CM

## 2017-07-31 DIAGNOSIS — H25813 Combined forms of age-related cataract, bilateral: Secondary | ICD-10-CM

## 2017-08-01 DIAGNOSIS — Z170 Estrogen receptor positive status [ER+]: Secondary | ICD-10-CM | POA: Diagnosis not present

## 2017-08-01 DIAGNOSIS — Z1509 Genetic susceptibility to other malignant neoplasm: Secondary | ICD-10-CM | POA: Diagnosis not present

## 2017-08-01 DIAGNOSIS — C50412 Malignant neoplasm of upper-outer quadrant of left female breast: Secondary | ICD-10-CM | POA: Diagnosis not present

## 2017-08-01 DIAGNOSIS — Z1501 Genetic susceptibility to malignant neoplasm of breast: Secondary | ICD-10-CM | POA: Diagnosis not present

## 2017-08-01 DIAGNOSIS — Z9013 Acquired absence of bilateral breasts and nipples: Secondary | ICD-10-CM | POA: Diagnosis not present
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
## 2017-08-07 ENCOUNTER — Ambulatory Visit (INDEPENDENT_AMBULATORY_CARE_PROVIDER_SITE_OTHER): Payer: Medicare Other | Admitting: Ophthalmology

## 2017-08-07 ENCOUNTER — Encounter (INDEPENDENT_AMBULATORY_CARE_PROVIDER_SITE_OTHER): Payer: Self-pay | Admitting: Ophthalmology

## 2017-08-07 DIAGNOSIS — H209 Unspecified iridocyclitis: Secondary | ICD-10-CM

## 2017-08-07 DIAGNOSIS — H25813 Combined forms of age-related cataract, bilateral: Secondary | ICD-10-CM

## 2017-08-07 DIAGNOSIS — H2000 Unspecified acute and subacute iridocyclitis: Secondary | ICD-10-CM

## 2017-08-07 DIAGNOSIS — H3581 Retinal edema: Secondary | ICD-10-CM | POA: Diagnosis not present

## 2017-08-07 DIAGNOSIS — H33321 Round hole, right eye: Secondary | ICD-10-CM

## 2017-08-17 ENCOUNTER — Other Ambulatory Visit: Payer: Self-pay | Admitting: Oncology
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
## 2017-08-21 ENCOUNTER — Ambulatory Visit (INDEPENDENT_AMBULATORY_CARE_PROVIDER_SITE_OTHER): Payer: Medicare Other | Admitting: Ophthalmology

## 2017-08-21 ENCOUNTER — Encounter (INDEPENDENT_AMBULATORY_CARE_PROVIDER_SITE_OTHER): Payer: Self-pay | Admitting: Ophthalmology

## 2017-08-21 DIAGNOSIS — H209 Unspecified iridocyclitis: Secondary | ICD-10-CM

## 2017-08-21 DIAGNOSIS — H3581 Retinal edema: Secondary | ICD-10-CM | POA: Diagnosis not present

## 2017-08-21 DIAGNOSIS — H2000 Unspecified acute and subacute iridocyclitis: Secondary | ICD-10-CM

## 2017-08-21 DIAGNOSIS — H33321 Round hole, right eye: Secondary | ICD-10-CM

## 2017-08-21 DIAGNOSIS — H25813 Combined forms of age-related cataract, bilateral: Secondary | ICD-10-CM | POA: Diagnosis not present
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
## 2017-09-04 ENCOUNTER — Ambulatory Visit (INDEPENDENT_AMBULATORY_CARE_PROVIDER_SITE_OTHER): Payer: Medicare Other | Admitting: Ophthalmology

## 2017-09-04 ENCOUNTER — Encounter (INDEPENDENT_AMBULATORY_CARE_PROVIDER_SITE_OTHER): Payer: Self-pay | Admitting: Ophthalmology

## 2017-09-04 DIAGNOSIS — H3581 Retinal edema: Secondary | ICD-10-CM | POA: Diagnosis not present

## 2017-09-04 DIAGNOSIS — H25813 Combined forms of age-related cataract, bilateral: Secondary | ICD-10-CM

## 2017-09-04 DIAGNOSIS — H33321 Round hole, right eye: Secondary | ICD-10-CM

## 2017-09-04 DIAGNOSIS — H2000 Unspecified acute and subacute iridocyclitis: Secondary | ICD-10-CM

## 2017-09-04 DIAGNOSIS — H209 Unspecified iridocyclitis: Secondary | ICD-10-CM

## 2017-09-07 ENCOUNTER — Ambulatory Visit (INDEPENDENT_AMBULATORY_CARE_PROVIDER_SITE_OTHER): Payer: Medicare Other | Admitting: Orthopaedic Surgery

## 2017-09-07 ENCOUNTER — Encounter (INDEPENDENT_AMBULATORY_CARE_PROVIDER_SITE_OTHER): Payer: Self-pay | Admitting: Orthopaedic Surgery

## 2017-09-07 DIAGNOSIS — M1712 Unilateral primary osteoarthritis, left knee: Secondary | ICD-10-CM | POA: Diagnosis not present

## 2017-09-07 NOTE — Progress Notes (Signed)
Office Visit Note   Patient: Vicki Hale           Date of Birth: Jun 11, 1950           MRN: [id number] Visit Date: 09/07/2017              Requested by: No referring provider defined for this encounter. PCP: Leonides Sake, MD   Assessment & Plan: Visit Diagnoses:  1. Unilateral primary osteoarthritis, left knee     Plan: Overall she has done very well from her arthritis exacerbation.  At this point she can continue to increase activity as tolerated.  She is very compliant and will use good judgment and what she does.  Questions encouraged and answered.  I will see her back as needed.  Follow-Up Instructions: Return if symptoms worsen or fail to improve.   Orders:  No orders of the defined types were placed in this encounter.  No orders of the defined types were placed in this encounter.     Procedures: No procedures performed   Clinical Data: No additional findings.   Subjective: Chief Complaint  Patient presents with  . Left Knee - Pain    Vicki Hale presents today for follow-up of her left knee pain.  She is overall doing better not quite 100% but she is marked.  She has been increasing activity as tolerated.  Denies any worsening of symptoms or any relapses.   Review of Systems   Objective: Vital Signs: There were no vitals taken for this visit.  Physical Exam  Ortho Exam Left knee exam shows no joint effusion.  No joint line tenderness. Specialty Comments:  No specialty comments available.  Imaging: No results found.   PMFS History: Patient Active Problem List   Diagnosis Date Noted  . Hypersensitivity reaction 05/15/2015  . BRCA1 positive 05/11/2015  . Genetic testing 02/19/2015  . Ovarian cancer, BRCA1 positive ([place]) 02/13/2015  . Malignant neoplasm of upper-outer quadrant of left breast in female, estrogen receptor positive ([place])   . Family history of breast cancer   . FH: BRCA1 gene positive   . Family history of pancreatic cancer   .  SAH (subarachnoid hemorrhage) ([place]) 05/12/2014   Past Medical History:  Diagnosis Date  . Arthritis   . Breast cancer ([place]) 2017   [facility] in [place]  . Family history of breast cancer   . Family history of pancreatic cancer   . FH: BRCA1 gene positive   . Ovarian cancer ([place])   . Pericarditis, acute    30 YRS AGO   . SAH (subarachnoid hemorrhage) ([place])    04/2014  . Stroke [facility])    no deficits    Family History  Problem Relation Age of Onset  . Colon polyps Mother   . Breast cancer Daughter 66  . BRCA 1/2 Daughter        BRCA1  . COPD Maternal Aunt   . Esophageal cancer Maternal Uncle   . Lung cancer Paternal Aunt   . Pancreatic cancer Maternal Grandmother        early 76s  . Prostate cancer Maternal Grandfather        16s  . Skin cancer Paternal Grandmother        untreated BCC  . COPD Maternal Aunt   . Lung cancer Maternal Aunt   . Breast cancer Cousin 83       maternal first cousin  . BRCA 1/2 Daughter        BRCA1  .  Esophageal cancer Cousin        maternal first cousin  . Bladder Cancer Paternal Aunt   . Colon cancer Neg Hx   . Rectal cancer Neg Hx   . Ulcerative colitis Neg Hx   . Stomach cancer Neg Hx     Past Surgical History:  Procedure Laterality Date  . BREAST RECONSTRUCTION WITH PLACEMENT OF TISSUE EXPANDER AND FLEX HD (ACELLULAR HYDRATED DERMIS) Bilateral 03/18/2015   Procedure: BILATERAL BREAST RECONSTRUCTION WITH PLACEMENT OF TISSUE EXPANDER AND FLEX HD (ACELLULAR HYDRATED DERMIS);  Surgeon: Loel Lofty Dillingham, DO;  Location: [facility];  Service: Plastics;  Laterality: Bilateral;  . CESAREAN SECTION     X2  . HERNIA REPAIR    . LIPOSUCTION WITH LIPOFILLING Bilateral 12/02/2015   Procedure: LIPOSUCTION WITH LIPOFILLING FROM ABDOMEN TO BILATERAL BREAST;  Surgeon: [name], DO;  Location: [facility];  Service: Plastics;  Laterality: Bilateral;  . LIPOSUCTION WITH LIPOFILLING Bilateral 03/23/2016   Procedure: BILATERAL FAT GRAFTING;   Surgeon: [name], DO;  Location: [facility];  Service: Plastics;  Laterality: Bilateral;  . MASTECTOMY W/ SENTINEL NODE BIOPSY Left 03/18/2015   Procedure: LEFT TOTAL MASTECTOMY WITH LEFT AXILLARY SENTINEL LYMPH NODE BIOPSY;  Surgeon: Excell Seltzer, MD;  Location: [facility];  Service: General;  Laterality: Left;  Marland Kitchen MASTECTOMY, PARTIAL Right 03/18/2015   Procedure: RIGHT PROPHYLACTIC MASTECTOMY ;  Surgeon: Excell Seltzer, MD;  Location: [facility];  Service: General;  Laterality: Right;  . REMOVAL OF BILATERAL TISSUE EXPANDERS WITH PLACEMENT OF BILATERAL BREAST IMPLANTS Bilateral 08/12/2015   Procedure: REMOVAL OF BILATERAL TISSUE EXPANDERS WITH PLACEMENT OF BILATERAL BREAST IMPLANTS;  Surgeon: [name], DO;  Location: [facility];  Service: Plastics;  Laterality: Bilateral;  . TONSILLECTOMY    . TOTAL ABDOMINAL HYSTERECTOMY W/ BILATERAL SALPINGOOPHORECTOMY     Social History   Occupational History  . Not on file  Tobacco Use  . Smoking status: Never Smoker  . Smokeless tobacco: Never Used  Substance and Sexual Activity  . Alcohol use: Yes    Alcohol/week: 0.0 standard drinks    Comment: ocassional; once monthly  . Drug use: No  . Sexual activity: Not on file

## 2017-10-03 DIAGNOSIS — H20011 Primary iridocyclitis, right eye: Secondary | ICD-10-CM | POA: Diagnosis not present

## 2017-10-03 DIAGNOSIS — H2513 Age-related nuclear cataract, bilateral: Secondary | ICD-10-CM | POA: Diagnosis not present

## 2017-10-03 DIAGNOSIS — H33321 Round hole, right eye: Secondary | ICD-10-CM | POA: Diagnosis not present
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
## 2017-12-05 ENCOUNTER — Ambulatory Visit (INDEPENDENT_AMBULATORY_CARE_PROVIDER_SITE_OTHER): Payer: Medicare Other | Admitting: Ophthalmology

## 2017-12-05 ENCOUNTER — Encounter (INDEPENDENT_AMBULATORY_CARE_PROVIDER_SITE_OTHER): Payer: Self-pay | Admitting: Ophthalmology

## 2017-12-05 DIAGNOSIS — H25813 Combined forms of age-related cataract, bilateral: Secondary | ICD-10-CM

## 2017-12-05 DIAGNOSIS — H2000 Unspecified acute and subacute iridocyclitis: Secondary | ICD-10-CM | POA: Diagnosis not present

## 2017-12-05 DIAGNOSIS — H3581 Retinal edema: Secondary | ICD-10-CM | POA: Diagnosis not present

## 2017-12-05 DIAGNOSIS — H209 Unspecified iridocyclitis: Secondary | ICD-10-CM | POA: Diagnosis not present

## 2017-12-05 DIAGNOSIS — H33321 Round hole, right eye: Secondary | ICD-10-CM

## 2017-12-06 ENCOUNTER — Encounter (INDEPENDENT_AMBULATORY_CARE_PROVIDER_SITE_OTHER): Payer: Self-pay | Admitting: Ophthalmology

## 2017-12-22 ENCOUNTER — Ambulatory Visit: Payer: Medicare Other | Admitting: Plastic Surgery

## 2018-01-23 ENCOUNTER — Ambulatory Visit: Payer: Medicare Other | Admitting: Plastic Surgery

## 2018-02-06 ENCOUNTER — Ambulatory Visit: Payer: Medicare Other | Admitting: Plastic Surgery

## 2018-02-15 DIAGNOSIS — Z23 Encounter for immunization: Secondary | ICD-10-CM | POA: Diagnosis not present

## 2018-07-01 ENCOUNTER — Other Ambulatory Visit: Payer: Self-pay | Admitting: Oncology

## 2018-08-01 ENCOUNTER — Telehealth: Payer: Self-pay | Admitting: Oncology

## 2018-08-01 NOTE — Telephone Encounter (Signed)
GM PAL 7/22 f/u moved to [facility] per GM. Confirmed with patient.

## 2018-08-07 ENCOUNTER — Other Ambulatory Visit: Payer: Self-pay

## 2018-08-07 DIAGNOSIS — C569 Malignant neoplasm of unspecified ovary: Secondary | ICD-10-CM

## 2018-08-07 DIAGNOSIS — Z170 Estrogen receptor positive status [ER+]: Secondary | ICD-10-CM

## 2018-08-07 DIAGNOSIS — C50412 Malignant neoplasm of upper-outer quadrant of left female breast: Secondary | ICD-10-CM

## 2018-08-08 ENCOUNTER — Inpatient Hospital Stay (HOSPITAL_BASED_OUTPATIENT_CLINIC_OR_DEPARTMENT_OTHER): Payer: Medicare Other | Admitting: Adult Health

## 2018-08-08 ENCOUNTER — Other Ambulatory Visit: Payer: Medicare Other

## 2018-08-08 ENCOUNTER — Other Ambulatory Visit: Payer: Self-pay | Admitting: Adult Health

## 2018-08-08 ENCOUNTER — Other Ambulatory Visit: Payer: Self-pay

## 2018-08-08 ENCOUNTER — Telehealth: Payer: Self-pay | Admitting: Oncology

## 2018-08-08 ENCOUNTER — Encounter: Payer: Self-pay | Admitting: Adult Health

## 2018-08-08 ENCOUNTER — Inpatient Hospital Stay: Payer: Medicare Other | Attending: Adult Health

## 2018-08-08 ENCOUNTER — Ambulatory Visit: Payer: Medicare Other | Admitting: Oncology

## 2018-08-08 VITALS — BP 143/52 | HR 57 | Temp 97.5°F | Resp 18 | Ht 67.0 in | Wt 181.1 lb

## 2018-08-08 DIAGNOSIS — Z800 Family history of malignant neoplasm of digestive organs: Secondary | ICD-10-CM | POA: Diagnosis not present

## 2018-08-08 DIAGNOSIS — Z1501 Genetic susceptibility to malignant neoplasm of breast: Secondary | ICD-10-CM | POA: Insufficient documentation

## 2018-08-08 DIAGNOSIS — Z8543 Personal history of malignant neoplasm of ovary: Secondary | ICD-10-CM | POA: Insufficient documentation

## 2018-08-08 DIAGNOSIS — C50412 Malignant neoplasm of upper-outer quadrant of left female breast: Secondary | ICD-10-CM | POA: Diagnosis not present

## 2018-08-08 DIAGNOSIS — Z90722 Acquired absence of ovaries, bilateral: Secondary | ICD-10-CM

## 2018-08-08 DIAGNOSIS — Z170 Estrogen receptor positive status [ER+]: Secondary | ICD-10-CM

## 2018-08-08 DIAGNOSIS — Z801 Family history of malignant neoplasm of trachea, bronchus and lung: Secondary | ICD-10-CM | POA: Diagnosis not present

## 2018-08-08 DIAGNOSIS — Z79899 Other long term (current) drug therapy: Secondary | ICD-10-CM | POA: Diagnosis not present

## 2018-08-08 DIAGNOSIS — Z803 Family history of malignant neoplasm of breast: Secondary | ICD-10-CM | POA: Insufficient documentation

## 2018-08-08 DIAGNOSIS — M129 Arthropathy, unspecified: Secondary | ICD-10-CM | POA: Insufficient documentation

## 2018-08-08 DIAGNOSIS — Z90710 Acquired absence of both cervix and uterus: Secondary | ICD-10-CM | POA: Diagnosis not present

## 2018-08-08 DIAGNOSIS — Z79811 Long term (current) use of aromatase inhibitors: Secondary | ICD-10-CM

## 2018-08-08 DIAGNOSIS — Z8673 Personal history of transient ischemic attack (TIA), and cerebral infarction without residual deficits: Secondary | ICD-10-CM | POA: Insufficient documentation

## 2018-08-08 DIAGNOSIS — Z9221 Personal history of antineoplastic chemotherapy: Secondary | ICD-10-CM

## 2018-08-08 DIAGNOSIS — C569 Malignant neoplasm of unspecified ovary: Secondary | ICD-10-CM

## 2018-08-08 LAB — CMP (CANCER CENTER ONLY)
ALT: 16 U/L (ref 0–44)
AST: 19 U/L (ref 15–41)
Albumin: 3.9 g/dL (ref 3.5–5.0)
Alkaline Phosphatase: 102 U/L (ref 38–126)
Anion gap: 9 (ref 5–15)
BUN: 19 mg/dL (ref 8–23)
CO2: 25 mmol/L (ref 22–32)
Calcium: 9.8 mg/dL (ref 8.9–10.3)
Chloride: 106 mmol/L (ref 98–111)
Creatinine: 0.91 mg/dL (ref 0.44–1.00)
GFR, Est AFR Am: >60 mL/min (ref >60)
GFR, Est Non Af Am: >60 mL/min (ref >60)
Glucose, Bld: 96 mg/dL (ref 70–99)
Potassium: 4.9 mmol/L (ref 3.5–5.1)
Sodium: 140 mmol/L (ref 135–145)
Total Bilirubin: 0.4 mg/dL (ref 0.3–1.2)
Total Protein: 7.1 g/dL (ref 6.5–8.1)

## 2018-08-08 LAB — CBC WITH DIFFERENTIAL (CANCER CENTER ONLY)
Abs Immature Granulocytes: 0.01 10*3/uL (ref 0.00–0.07)
Basophils Absolute: 0 10*3/uL (ref 0.0–0.1)
Basophils Relative: 1 %
Eosinophils Absolute: 0.3 10*3/uL (ref 0.0–0.5)
Eosinophils Relative: 6 %
HCT: 40.5 % (ref 36.0–46.0)
Hemoglobin: 13 g/dL (ref 12.0–15.0)
Immature Granulocytes: 0 %
Lymphocytes Relative: 21 %
Lymphs Abs: 1.2 10*3/uL (ref 0.7–4.0)
MCH: 29 pg (ref 26.0–34.0)
MCHC: 32.1 g/dL (ref 30.0–36.0)
MCV: 90.4 fL (ref 80.0–100.0)
Monocytes Absolute: 0.5 10*3/uL (ref 0.1–1.0)
Monocytes Relative: 9 %
Neutro Abs: 3.6 10*3/uL (ref 1.7–7.7)
Neutrophils Relative %: 63 %
Platelet Count: 289 10*3/uL (ref 150–400)
RBC: 4.48 MIL/uL (ref 3.87–5.11)
RDW: 12.8 % (ref 11.5–15.5)
WBC Count: 5.7 10*3/uL (ref 4.0–10.5)
nRBC: 0 % (ref 0.0–0.2)

## 2018-08-08 NOTE — Progress Notes (Signed)
[place]  [phone]) [phone] [phone]   ID: Vicki Hale DOB: [birth year]  MR#: [id number]  OBS#:[id number]  Patient Care Team: Vicki Sake, MD as PCP - General (Family Medicine) Hale, Vicki Dad, MD as Consulting Physician (Oncology) Vicki Bookbinder, MD as Consulting Physician (General Surgery) Vicki Lose, MD as Consulting Physician (Neurosurgery) Vicki Slates, MD as Physician Assistant (Radiology) Vicki Hawking, MD as Consulting Physician (Neurology) Vicki Caffey, MD as Consulting Physician (Ophthalmology) Vicki Jacks, MD as Consulting Physician (Ophthalmology) Vicki Koyanagi, MD as Attending Physician (Orthopedic Surgery) PCP: Vicki Sake, MD GYN: OTHER MD:  CHIEF COMPLAINT: Breast cancer in BRCA carrier  CURRENT TREATMENT: Anastrozole  CANCER HISTORY: From the original intake note:  Vicki Hale was diagnosed with ovarian cancer in April 2003. She underwent total abdominal hysterectomy and bilateral salpingo-oophorectomy with a full staging operation at [facility] that month and was then treated according to GOG 182, with 4 cycles of carboplatin/topotecan and then 4 cycles of carboplatin/paclitaxel. She was then followed by gynecologic oncology through 2011, and there has been no history of recurrent disease.  On 47/65/4650 Vicki Hale underwent screening mammography at [facility] with tomosynthesis. Breast density was category A. New grouped calcifications were noted in the left breast upper outer quadrant and the patient was recalled for diagnostic unilateral left mammography 01/26/2015. This confirmed a group of new linear calcifications in the upper outer quadrant of the left breast extending over an area of 2.0 cm..   On 01/28/2015 the patient underwent biopsy of her left breast, with the pathology ([id number]) showing an invasive ductal carcinoma, grade 2, estrogen receptor 100% positive, progesterone receptor 50% positive, both with strong  staining intensity, with an MIB-1 of 10% and no HER-2 amplification, the signals ratio being 1.21 and the number per cell 2.00.  Her subsequent history is as detailed below  INTERVAL HISTORY: Vicki Hale returns today for follow-up and treatment of her estrogen receptor positive breast cancer.  She continues on anastrozole, with good tolerance. She is BRCA1 positive.  She has FH of MGM with pancreatic cancer.   REVIEW OF SYSTEMS: Vicki Hale reports feeling well.  She has some knee issues, in her right knee that started and once she started wearing her brace they resolved.  She denies any new pain.  She isn't exercising as much as she did before allergy season.  She is keeping appropriate pandemic precautions.    Vicki Hale denies any other issues such as fever, chills, chest pain, palpitations, cough, shortness of breath.  She is without skin rash or lesion.  She denies any dysphagia, nausea, vomiting, bowel/bladder changes, new pain.  A detailed ROS was otherwise non contributory.       Past Medical History:  Diagnosis Date  . Arthritis   . Breast cancer ([place]) 2017   [facility] in [place]  . Family history of breast cancer   . Family history of pancreatic cancer   . FH: BRCA1 gene positive   . Ovarian cancer ([place])   . Pericarditis, acute    30 YRS AGO   . SAH (subarachnoid hemorrhage) ([place])    04/2014  . Stroke [facility])    no deficits    PAST SURGICAL HISTORY: Past Surgical History:  Procedure Laterality Date  . BREAST RECONSTRUCTION WITH PLACEMENT OF TISSUE EXPANDER AND FLEX HD (ACELLULAR HYDRATED DERMIS) Bilateral 03/18/2015   Procedure: BILATERAL BREAST RECONSTRUCTION WITH PLACEMENT OF TISSUE EXPANDER AND FLEX HD (ACELLULAR HYDRATED DERMIS);  Surgeon: Loel Lofty Dillingham, DO;  Location: [place];  Service: Clinical cytogeneticist;  Laterality: Bilateral;  . CESAREAN SECTION     X2  . HERNIA REPAIR    . LIPOSUCTION WITH LIPOFILLING Bilateral 12/02/2015   Procedure: LIPOSUCTION WITH LIPOFILLING FROM ABDOMEN TO BILATERAL  BREAST;  Surgeon: [name], DO;  Location: [place];  Service: Plastics;  Laterality: Bilateral;  . LIPOSUCTION WITH LIPOFILLING Bilateral 03/23/2016   Procedure: BILATERAL FAT GRAFTING;  Surgeon: [name], DO;  Location: [place];  Service: Plastics;  Laterality: Bilateral;  . MASTECTOMY W/ SENTINEL NODE BIOPSY Left 03/18/2015   Procedure: LEFT TOTAL MASTECTOMY WITH LEFT AXILLARY SENTINEL LYMPH NODE BIOPSY;  Surgeon: Excell Seltzer, MD;  Location: [place];  Service: General;  Laterality: Left;  Marland Kitchen MASTECTOMY, PARTIAL Right 03/18/2015   Procedure: RIGHT PROPHYLACTIC MASTECTOMY ;  Surgeon: Excell Seltzer, MD;  Location: [place];  Service: General;  Laterality: Right;  . REMOVAL OF BILATERAL TISSUE EXPANDERS WITH PLACEMENT OF BILATERAL BREAST IMPLANTS Bilateral 08/12/2015   Procedure: REMOVAL OF BILATERAL TISSUE EXPANDERS WITH PLACEMENT OF BILATERAL BREAST IMPLANTS;  Surgeon: [name], DO;  Location: [place];  Service: Plastics;  Laterality: Bilateral;  . TONSILLECTOMY    . TOTAL ABDOMINAL HYSTERECTOMY W/ BILATERAL SALPINGOOPHORECTOMY      FAMILY HISTORY Family History  Problem Relation Age of Onset  . Colon polyps Mother   . Breast cancer Daughter 58  . BRCA 1/2 Daughter        BRCA1  . COPD Maternal Aunt   . Esophageal cancer Maternal Uncle   . Lung cancer Paternal Aunt   . Pancreatic cancer Maternal Grandmother        early 78s  . Prostate cancer Maternal Grandfather        19s  . Skin cancer Paternal Grandmother        untreated BCC  . COPD Maternal Aunt   . Lung cancer Maternal Aunt   . Breast cancer Cousin 67       maternal first cousin  . BRCA 1/2 Daughter        BRCA1  . Esophageal cancer Cousin        maternal first cousin  . Bladder Cancer Paternal Aunt   . Colon cancer Neg Hx   . Rectal cancer Neg Hx   . Ulcerative colitis Neg Hx   . Stomach cancer Neg Hx    the patient's 2 daughters have  been tested for the BRCA gene and both are positive. One of them had breast cancer at the age of 59. In addition on the maternal side there is pancreatic prostate breast and esophageal cancer. The patient's father died at the age of 39 and a car accident and there is very little information on his side of the family. The patient's mother died at 59 from COPD. She had had a hysterectomy in her 40s. The patient's mother had 5 siblings several from died young. The patient herself had no sisters, 2 brothers.  GYNECOLOGIC HISTORY:  No LMP recorded. Patient has had a hysterectomy.  Menarche age 74, first live birth age 25, the patient is [place] P2. She underwent TAH/BSO in 2003 for her ovarian cancer. She took hormone replacement for approximately 6 months. She was on oral contraceptives for approximately 4 years remotely, with no complications.  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 Her husband Shanon Brow works for a [employer]. Daughter Lianne Cure  lives in [place] where she works as a Programmer, multimedia. She is 68 years old. Daughter Seairra Otani lives in [place] and is a Pharmacist, hospital. She is 67 years old. These ages are as of January 2017. The patient has 4 grandchildren. She attends a [employer]    ADVANCED DIRECTIVES: Not in place   HEALTH MAINTENANCE: Social History   Tobacco Use  . Smoking status: Never Smoker  . Smokeless tobacco: Never Used  Substance Use Topics  . Alcohol use: Yes    Alcohol/week: 0.0 standard drinks    Comment: ocassional; once monthly  . Drug use: No     Colonoscopy: September 2016/ [facility]  PAP:  Bone density: 2016  Lipid panel:  Allergies  Allergen Reactions  . Taxotere [Docetaxel] Rash  . Penicillins Itching and Rash    Has patient had a PCN reaction causing immediate rash, facial/tongue/throat swelling, SOB or lightheadedness with hypotension: No Has patient had a PCN  reaction causing severe rash involving mucus membranes or skin necrosis: No Has patient had a PCN reaction that required hospitalization No Has patient had a PCN reaction occurring within the last 10 years: No If all of the above answers are "NO", then may proceed with Cephalosporin use.     Current Outpatient Medications  Medication Sig Dispense Refill  . anastrozole (ARIMIDEX) 1 MG tablet TAKE 1 TABLET BY MOUTH EVERY DAY 90 tablet 4  . Ascorbic Acid (VITAMIN C) 1000 MG tablet Take 1,000 mg by mouth daily.     . B Complex Vitamins (B COMPLEX PO) Take 2 tablets by mouth daily.    Marland Kitchen loratadine (CLARITIN) 10 MG tablet Take 10 mg by mouth daily.    . Multiple Vitamin (MULTIVITAMIN WITH MINERALS) TABS tablet Take 2 tablets by mouth daily.    . Vitamin D, Cholecalciferol, 1000 units CAPS Take by mouth.    . Vitamins/Minerals TABS Take by mouth.     No current facility-administered medications for this visit.     OBJECTIVE: Middle-aged white woman who appears well  Vitals:   08/08/18 1059  BP: (!) 143/52  Pulse: (!) 57  Resp: 18  Temp: (!) 97.5 F (36.4 C)  SpO2: 97%     Body mass index is 28.37 kg/m.    ECOG FS:1 - Symptomatic but completely ambulatory GENERAL: Patient is a well appearing female in no acute distress HEENT:  Sclerae anicteric.  Oropharynx clear and moist. No ulcerations or evidence of oropharyngeal candidiasis. Neck is supple.  NODES:  No cervical, supraclavicular, or axillary lymphadenopathy palpated.  BREAST EXAM:  S/p bilateral mastectomy, no sign of local recurrence noted LUNGS:  Clear to auscultation bilaterally.  No wheezes or rhonchi. HEART:  Regular rate and rhythm. No murmur appreciated. ABDOMEN:  Soft, nontender.  Positive, normoactive bowel sounds. No organomegaly palpated. MSK:  No focal spinal tenderness to palpation. Full range of motion bilaterally in the upper extremities. EXTREMITIES:  No peripheral edema.   SKIN:  Clear with no obvious rashes or  skin changes. No nail dyscrasia. NEURO:  Nonfocal. Well oriented.  Appropriate affect.   LAB RESULTS:  CMP     Component Value Date/Time   NA 140 07/27/2017 1428   NA 142 07/15/2016 0828   K 4.5 07/27/2017 1428   K 4.2 07/15/2016 0828   CL 105 07/27/2017 1428   CO2 27 07/27/2017 1428   CO2 25 07/15/2016 0828   GLUCOSE 91 07/27/2017 1428   GLUCOSE 94 07/15/2016 0828   BUN 18 07/27/2017  1428   BUN 14.8 07/15/2016 0828   CREATININE 0.85 07/27/2017 1428   CREATININE 0.8 07/15/2016 0828   CALCIUM 10.3 07/27/2017 1428   CALCIUM 9.9 07/15/2016 0828   PROT 7.3 07/27/2017 1428   PROT 6.6 07/15/2016 0828   ALBUMIN 4.2 07/27/2017 1428   ALBUMIN 3.8 07/15/2016 0828   AST 21 07/27/2017 1428   AST 22 07/15/2016 0828   ALT 18 07/27/2017 1428   ALT 19 07/15/2016 0828   ALKPHOS 109 07/27/2017 1428   ALKPHOS 103 07/15/2016 0828   BILITOT 0.4 07/27/2017 1428   BILITOT 0.39 07/15/2016 0828   GFRNONAA >60 07/27/2017 1428   GFRAA >60 07/27/2017 1428    INo results found for: SPEP, UPEP  Lab Results  Component Value Date   WBC 5.7 08/08/2018   NEUTROABS 3.6 08/08/2018   HGB 13.0 08/08/2018   HCT 40.5 08/08/2018   MCV 90.4 08/08/2018   PLT 289 08/08/2018      Chemistry      Component Value Date/Time   NA 140 07/27/2017 1428   NA 142 07/15/2016 0828   K 4.5 07/27/2017 1428   K 4.2 07/15/2016 0828   CL 105 07/27/2017 1428   CO2 27 07/27/2017 1428   CO2 25 07/15/2016 0828   BUN 18 07/27/2017 1428   BUN 14.8 07/15/2016 0828   CREATININE 0.85 07/27/2017 1428   CREATININE 0.8 07/15/2016 0828      Component Value Date/Time   CALCIUM 10.3 07/27/2017 1428   CALCIUM 9.9 07/15/2016 0828   ALKPHOS 109 07/27/2017 1428   ALKPHOS 103 07/15/2016 0828   AST 21 07/27/2017 1428   AST 22 07/15/2016 0828   ALT 18 07/27/2017 1428   ALT 19 07/15/2016 0828   BILITOT 0.4 07/27/2017 1428   BILITOT 0.39 07/15/2016 0828      No results found for: LABCA2  No components found for:  LABCA125  No results for input(s): INR in the last 168 hours.  Urinalysis No results found for: COLORURINE, APPEARANCEUR, LABSPEC, PHURINE, GLUCOSEU, HGBUR, BILIRUBINUR, KETONESUR, PROTEINUR, UROBILINOGEN, NITRITE, LEUKOCYTESUR  STUDIES: Bone density obtained at [facility] 07/08/2016 shows a T score of -2.3, it was -2.42 years ago.  ASSESSMENT: 68 y.o. BRCA1 positive [place] woman  (1) ovarian cancer stage IIIB diagnosed April 2003  (a) s/p supracervical hysterectomy and BSO with surgical staging April 2003  (b) treated according to GOG 182: carboplatin/ topotecan x4 followed by carboplatin/taxol x4  (c) last chemotherapy dose October 2003  (2) status post left breast upper outer quadrant biopsy 01/28/2015 for an invasive ductal carcinoma, grade 2, estrogen and progesterone receptor positive, HER-2 not amplified, with an MIB-1 of 10%  (a) tamoxifen started neoadjuvantly due to concerns regarding surgical delay, interrupted with chemotherapy  (3) status post bilateral mastectomies with left axillary lymph node sampling 03/18/2015 showing  (a) on the right, benign disease  (b) on the left, a pT1c pN0, stage IA invasive ductal carcinoma, repeat HER-2 again negative, stage IA    (4) Oncotype DX score of 29, in the high intermediate range, predicts a risk of recurrence with tamoxifen alone of 19%. The addition of chemotherapy in this setting is predicted to decrease distant disease-free recurrence by approximately 6%.   (5) adjuvant chemotherapy with cyclophosphamide and docetaxel started 04/22/2015, repeated every 21 days 4  (a) gemcitabine switched for docetaxel for cycles 3 and 4 due to rash.  (b) chemotherapy completed 06/24/2015  (6) adjuvant anastrozole started 07/29/2015  (a) bone density at [facility] 06/09/2014 shows  a T score of -2.4; this is increased from prior  (b) bone density at [facility] 07/08/2016 shows a T score of -2.3, which is stable  PLAN: Fedra is doing well today.  She has  no clinical signs of recurrence today. Her CBC is stable and that was reviewed with her today.  She will continue on Anastrozole as she is tolerating it well.  We are calling [facility] to get any records of her most recent bone density.    Jerri is BRCA 1 positive and has a FH of MGM with pancreatic cancer.  I reviewed the updated NCCN guidelines from 12/2017 which recommend MRCP in this instance.  I have placed orders for this and also gave info on what the MRCP is in her AVS.    Lynanne was recommended to continue with healthy diet and exercise.    Sammye will return in one year for labs and f/u with Dr. Jana Hakim.  She was recommended to continue with the appropriate pandemic precautions. She knows to call between now and her next appointment for any questions or concerns.    A total of (20) minutes of face-to-face time was spent with this patient with greater than 50% of that time in counseling and care-coordination.   Wilber Bihari, NP  08/08/18 11:14 AM Medical Oncology and Hematology [facility] [address] [place], [place] [address] Tel. [phone]    Fax. [phone]

## 2018-08-08 NOTE — Telephone Encounter (Signed)
I talk with patient regarding schedule  

## 2018-08-08 NOTE — Patient Instructions (Signed)
Magnetic Resonance Cholangiopancreatogram Magnetic resonance imaging (MRI) is a painless test that produces images of the inside of the body without using X-rays. During an MRI, strong magnets and radio waves work together in a Research officer, political party to form detailed images. MRI images may provide more details about a medical condition than X-rays, CT scans, and ultrasounds can provide. Magnetic resonance cholangiopancreatogram (MRCP) is an MRI that is done on your gallbladder, bile duct, pancreas, and pancreatic duct. This test can be used to help diagnose problems such as tumors, stones, infection, or inflammation. It is sometimes used to help determine the cause of pancreatitis or abdominal pain. In some cases, dye (contrast material or contrast dye) may be injected into your bloodstream to make the MRI images even clearer. Tell a health care provider about:  Any surgeries you have had.  Any metal you may have in your body. The magnet used in MRCP can cause metal objects in your body to move. Metal can also make it hard to get high-quality images. Objects that may contain metal include: ? Any joint replacement (prosthesis), such as an artificial knee or hip. ? An implanted defibrillator, pacemaker, or neurostimulator. ? A metallic ear implant (cochlear implant). ? An artificial heart valve. ? A metallic object in the eye socket. ? Metal splinters. ? Bullet fragments. ? A port for delivering insulin or chemotherapy.  Any tattoos. Some red dyes contain iron, which can cause problems with testing.  Whether you are pregnant, may be pregnant, or are breastfeeding.  Any fear of cramped spaces (claustrophobia). If this is a problem, it usually can be relieved with medicines.  Any allergies you have.  Medical conditions you have, including kidney disease.  All medicines you are taking, including vitamins, herbs, eye drops, creams, and over-the-counter medicines. What are the risks? Generally, this is  a safe test. However, problems can occur:  If you have metal in your body, it may be affected by the magnet used during the test. If you have a metallic implant close to the area being tested, it may be hard to get high-quality images.  Allergic reaction to the contrast dye is possible.  If you are pregnant, you should avoid MRI tests, including MRCP, during the first three months of pregnancy. MRI may have effects on an unborn baby.  If you are breastfeeding and contrast material will be used during your test, you may need to stop breastfeeding temporarily. Your breast milk may contain contrast material until the material naturally leaves your body. What happens before the procedure?  You will be asked to remove all metal, including: ? Your watch, jewelry, and other metal objects. ? Hearing aids. ? Dentures. ? Underwire bra. ? Makeup. Certain kinds of makeup contain small amounts of metal. ? Braces and fillings normally are not a problem.  If you are breastfeeding, ask your health care provider if you need to pump before your test and stop breastfeeding temporarily. You may need to do this if contrast material will be used. What happens during the procedure?   You may be given earplugs or headphones to listen to music. The machine can be noisy.  If a contrast material will be used, an IV will be inserted into one of your veins. Contrast material will be injected into your IV.  You will lie down on a platform, similar to a long table.  The platform will slide into a tunnel that has magnets inside of it. When you are inside the tunnel, you will  still be able to talk to your health care provider.  You will be asked to lie very still while images are taken. Your health care provider will tell you when you can move. You may have to wait a few minutes to make sure that the images produced during the test are readable.  When all images are produced, the platform will slide out of the  tunnel. The procedure may vary among health care providers and hospitals. What happens after the procedure?  You may return to your normal activities right away, or as told by your health care provider.  If contrast material was used: ? It will leave your body through your urine within a day. You may be told to drink plenty of fluids to help flush the contrast material out of your system. ? If you are breastfeeding, do not breastfeed your child until your health care provider says that this is safe.  It is up to you to get your test results. Ask your health care provider, or the department that is doing the test, when your results will be ready. Summary  Magnetic resonance imaging (MRI) is a painless test that produces detailed pictures of the inside of your body without using X-rays. Instead, strong magnets and radio waves work together in a Research officer, political party to form very detailed and sharp images.  Magnetic resonance cholangiopancreatogram (MRCP) is an MRI that is done on your gallbladder, bile duct, pancreas, and pancreatic duct. MRCP produces detailed images of these organs.  MRCP can be used to help diagnose tumors, stones, infection, or inflammation. It is sometimes used to help determine the cause of pancreatitis or abdominal pain.  Before your test, be sure to tell your health care provider about any metal you may have in your body.  Talk with your health care provider about what your test results mean. This information is not intended to replace advice given to you by your health care provider. Make sure you discuss any questions you have with your health care provider. Document Released: 06/22/2007 Document Revised: 12/16/2016 Document Reviewed: 11/28/2016 Elsevier Patient Education  2020 [employer].

## 2018-08-09 ENCOUNTER — Encounter (INDEPENDENT_AMBULATORY_CARE_PROVIDER_SITE_OTHER): Payer: Self-pay | Admitting: Ophthalmology

## 2018-08-09 ENCOUNTER — Ambulatory Visit (INDEPENDENT_AMBULATORY_CARE_PROVIDER_SITE_OTHER): Payer: Medicare Other | Admitting: Ophthalmology

## 2018-08-09 DIAGNOSIS — H33301 Unspecified retinal break, right eye: Secondary | ICD-10-CM | POA: Diagnosis not present

## 2018-08-09 DIAGNOSIS — H3581 Retinal edema: Secondary | ICD-10-CM

## 2018-08-09 DIAGNOSIS — H33321 Round hole, right eye: Secondary | ICD-10-CM | POA: Diagnosis not present

## 2018-08-09 DIAGNOSIS — H2000 Unspecified acute and subacute iridocyclitis: Secondary | ICD-10-CM | POA: Diagnosis not present

## 2018-08-09 DIAGNOSIS — H20011 Primary iridocyclitis, right eye: Secondary | ICD-10-CM | POA: Diagnosis not present

## 2018-08-09 DIAGNOSIS — H209 Unspecified iridocyclitis: Secondary | ICD-10-CM | POA: Diagnosis not present

## 2018-08-09 DIAGNOSIS — H25813 Combined forms of age-related cataract, bilateral: Secondary | ICD-10-CM

## 2018-08-09 DIAGNOSIS — H2513 Age-related nuclear cataract, bilateral: Secondary | ICD-10-CM | POA: Diagnosis not present
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
## 2018-08-20 ENCOUNTER — Other Ambulatory Visit: Payer: Self-pay | Admitting: Oncology

## 2018-09-07 ENCOUNTER — Encounter (INDEPENDENT_AMBULATORY_CARE_PROVIDER_SITE_OTHER): Payer: Medicare Other | Admitting: Ophthalmology

## 2018-11-02 ENCOUNTER — Other Ambulatory Visit: Payer: Self-pay | Admitting: Adult Health

## 2018-11-02 DIAGNOSIS — Z800 Family history of malignant neoplasm of digestive organs: Secondary | ICD-10-CM

## 2018-11-02 DIAGNOSIS — Z1501 Genetic susceptibility to malignant neoplasm of breast: Secondary | ICD-10-CM

## 2018-11-02 DIAGNOSIS — Z1509 Genetic susceptibility to other malignant neoplasm: Secondary | ICD-10-CM

## 2018-11-05 ENCOUNTER — Ambulatory Visit (HOSPITAL_COMMUNITY): Payer: Medicare Other

## 2018-11-08 ENCOUNTER — Other Ambulatory Visit: Payer: Self-pay

## 2018-11-08 ENCOUNTER — Telehealth: Payer: Self-pay

## 2018-11-08 DIAGNOSIS — C50412 Malignant neoplasm of upper-outer quadrant of left female breast: Secondary | ICD-10-CM

## 2018-11-08 DIAGNOSIS — Z170 Estrogen receptor positive status [ER+]: Secondary | ICD-10-CM

## 2018-11-08 NOTE — Telephone Encounter (Signed)
Pt called regarding need for MRI vs breast US.  Pt states Dr Ferdie Ping has instructed her that she needs bilat breast ultrasound d/t implants.  Orders have been faxed to [facility].

## 2018-11-12 ENCOUNTER — Ambulatory Visit (HOSPITAL_COMMUNITY)
Admission: RE | Admit: 2018-11-12 | Discharge: 2018-11-12 | Disposition: A | Discharge status: Home / Self Care | Payer: Medicare Other | Source: Ambulatory Visit | Attending: Adult Health | Admitting: Adult Health

## 2018-11-12 ENCOUNTER — Other Ambulatory Visit: Payer: Self-pay

## 2018-11-12 DIAGNOSIS — Z1289 Encounter for screening for malignant neoplasm of other sites: Secondary | ICD-10-CM | POA: Diagnosis not present

## 2018-11-12 DIAGNOSIS — R1011 Right upper quadrant pain: Secondary | ICD-10-CM | POA: Diagnosis not present

## 2018-11-12 DIAGNOSIS — Z1501 Genetic susceptibility to malignant neoplasm of breast: Secondary | ICD-10-CM | POA: Diagnosis not present

## 2018-11-12 DIAGNOSIS — Z800 Family history of malignant neoplasm of digestive organs: Secondary | ICD-10-CM | POA: Insufficient documentation

## 2018-11-12 DIAGNOSIS — Z1509 Genetic susceptibility to other malignant neoplasm: Secondary | ICD-10-CM | POA: Diagnosis not present

## 2018-11-12 LAB — POCT I-STAT CREATININE: Creatinine, Ser: 0.8 mg/dL (ref 0.44–1.00)

## 2018-11-12 MED ORDER — GADOBUTROL 1 MMOL/ML IV SOLN
9.0000 mL | Freq: Once | INTRAVENOUS | Status: AC | PRN
  Administered 2018-11-12: 09:00:00 9 mL via INTRAVENOUS

## 2018-11-19 DIAGNOSIS — R0600 Dyspnea, unspecified: Secondary | ICD-10-CM | POA: Diagnosis not present

## 2018-11-19 DIAGNOSIS — J069 Acute upper respiratory infection, unspecified: Secondary | ICD-10-CM | POA: Diagnosis not present

## 2018-11-19 DIAGNOSIS — U071 COVID-19: Secondary | ICD-10-CM | POA: Diagnosis not present

## 2019-01-01 DIAGNOSIS — Z23 Encounter for immunization: Secondary | ICD-10-CM | POA: Diagnosis not present

## 2019-04-08 DIAGNOSIS — H33321 Round hole, right eye: Secondary | ICD-10-CM | POA: Diagnosis not present

## 2019-04-08 DIAGNOSIS — H2513 Age-related nuclear cataract, bilateral: Secondary | ICD-10-CM | POA: Diagnosis not present

## 2019-07-09 ENCOUNTER — Telehealth: Payer: Self-pay | Admitting: Oncology

## 2019-07-09 NOTE — Telephone Encounter (Signed)
R/s appt per pt request. Pt is going out of town. Moved appt to 7/26.

## 2019-07-30 ENCOUNTER — Encounter: Payer: Self-pay | Admitting: Plastic Surgery

## 2019-07-30 DIAGNOSIS — M85851 Other specified disorders of bone density and structure, right thigh: Secondary | ICD-10-CM | POA: Diagnosis not present

## 2019-07-30 DIAGNOSIS — M85852 Other specified disorders of bone density and structure, left thigh: Secondary | ICD-10-CM | POA: Diagnosis not present

## 2019-08-08 ENCOUNTER — Ambulatory Visit: Payer: Medicare Other | Admitting: Oncology

## 2019-08-08 ENCOUNTER — Other Ambulatory Visit: Payer: Medicare Other

## 2019-08-11 NOTE — Progress Notes (Signed)
Vicki Hale  [phone] [phone]   ID: Vicki Hale DOB: [birth year]  MR#: [id number]  KYH#:[id number]  Patient Care Team: Leonides Sake, MD as PCP - General (Family Medicine) [name], Virgie Dad, MD as Consulting Physician (Oncology) Rolm Bookbinder, MD as Consulting Physician (General Surgery) Consuella Lose, MD as Consulting Physician (Neurosurgery) Christene Slates, MD as Physician Assistant (Radiology) Rosalin Hawking, MD as Consulting Physician (Neurology) Bernarda Caffey, MD as Consulting Physician (Ophthalmology) Clent Jacks, MD as Consulting Physician (Ophthalmology) Leandrew Koyanagi, MD as Attending Physician (Orthopedic Surgery) OTHER MD:  CHIEF COMPLAINT: Breast cancer in BRCA carrier (s/p bilateral mastectomies)  CURRENT TREATMENT: Anastrozole   INTERVAL HISTORY: Vicki Hale returns today for follow-up of her estrogen receptor positive breast cancer.  She continues on anastrozole, with good tolerance.  She tells me however that Dr. Lisbeth Ply has obtained a bone density and found her to be significantly osteopenic.  She is BRCA1 positive.  She has a family history of MGM with pancreatic cancer.  Since her last visit, she underwent abdomen MRI on 11/12/2018 to evaluate right upper quadrant pain given her elevated risk for pancreatic cancer. This was unremarkable, specifically no evidence for pancreatic mass lesion.   REVIEW OF SYSTEMS: Vicki Hale continues to tolerate anastrozole well.  She tells me that she picked up a suitcase and developed significant pain in her lumbosacral spine.  It does not radiate down the leg.  There have been no intercurrent falls.  She is taking Motrin 400 mg twice daily to control the pain which she thinks is getting a little bit better.  She is concerned that insurance has not approved her denosumab/Prolia which is what she would need for her osteopenia since she had a significant rash and other reactions to  bisphosphonates in the past.  Aside from these issues a detailed review of systems today was stable.   CANCER HISTORY: From the original intake note:  Vicki Hale was diagnosed with ovarian cancer in April 2003. She underwent total abdominal hysterectomy and bilateral salpingo-oophorectomy with a full staging operation at [facility] that month and was then treated according to GOG 182, with 4 cycles of carboplatin/topotecan and then 4 cycles of carboplatin/paclitaxel. She was then followed by gynecologic oncology through 2011, and there has been no history of recurrent disease.  On 15/17/6160 Vicki Hale underwent screening mammography at [facility] with tomosynthesis. Breast density was category A. New grouped calcifications were noted in the left breast upper outer quadrant and the patient was recalled for diagnostic unilateral left mammography 01/26/2015. This confirmed a group of new linear calcifications in the upper outer quadrant of the left breast extending over an area of 2.0 cm..   On 01/28/2015 the patient underwent biopsy of her left breast, with the pathology ([id number]) showing an invasive ductal carcinoma, grade 2, estrogen receptor 100% positive, progesterone receptor 50% positive, both with strong staining intensity, with an MIB-1 of 10% and no HER-2 amplification, the signals ratio being 1.21 and the number per cell 2.00.  Her subsequent history is as detailed below   PAST MEDICAL HISTORY:  Past Medical History:  Diagnosis Date  . Arthritis   . Breast cancer ([place]) 2017   [facility] in [place]  . Family history of breast cancer   . Family history of pancreatic cancer   . FH: BRCA1 gene positive   . Ovarian cancer ([place])   . Pericarditis, acute    30 YRS AGO   . SAH (subarachnoid hemorrhage) ([place])    04/2014  .  Stroke [facility])    no deficits    PAST SURGICAL HISTORY: Past Surgical History:  Procedure Laterality Date  . BREAST RECONSTRUCTION WITH PLACEMENT OF TISSUE EXPANDER AND FLEX HD (ACELLULAR  HYDRATED DERMIS) Bilateral 03/18/2015   Procedure: BILATERAL BREAST RECONSTRUCTION WITH PLACEMENT OF TISSUE EXPANDER AND FLEX HD (ACELLULAR HYDRATED DERMIS);  Surgeon: Loel Lofty Dillingham, DO;  Location: [place];  Service: Plastics;  Laterality: Bilateral;  . CESAREAN SECTION     X2  . HERNIA REPAIR    . LIPOSUCTION WITH LIPOFILLING Bilateral 12/02/2015   Procedure: LIPOSUCTION WITH LIPOFILLING FROM ABDOMEN TO BILATERAL BREAST;  Surgeon: [name], DO;  Location: [place];  Service: Plastics;  Laterality: Bilateral;  . LIPOSUCTION WITH LIPOFILLING Bilateral 03/23/2016   Procedure: BILATERAL FAT GRAFTING;  Surgeon: [name], DO;  Location: [place];  Service: Plastics;  Laterality: Bilateral;  . MASTECTOMY W/ SENTINEL NODE BIOPSY Left 03/18/2015   Procedure: LEFT TOTAL MASTECTOMY WITH LEFT AXILLARY SENTINEL LYMPH NODE BIOPSY;  Surgeon: Excell Seltzer, MD;  Location: [place];  Service: General;  Laterality: Left;  Marland Kitchen MASTECTOMY, PARTIAL Right 03/18/2015   Procedure: RIGHT PROPHYLACTIC MASTECTOMY ;  Surgeon: Excell Seltzer, MD;  Location: [place];  Service: General;  Laterality: Right;  . REMOVAL OF BILATERAL TISSUE EXPANDERS WITH PLACEMENT OF BILATERAL BREAST IMPLANTS Bilateral 08/12/2015   Procedure: REMOVAL OF BILATERAL TISSUE EXPANDERS WITH PLACEMENT OF BILATERAL BREAST IMPLANTS;  Surgeon: [name], DO;  Location: [place];  Service: Plastics;  Laterality: Bilateral;  . TONSILLECTOMY    . TOTAL ABDOMINAL HYSTERECTOMY W/ BILATERAL SALPINGOOPHORECTOMY      FAMILY HISTORY Family History  Problem Relation Age of Onset  . Colon polyps Mother   . Breast cancer Daughter 61  . BRCA 1/2 Daughter        BRCA1  . COPD Maternal Aunt   . Esophageal cancer Maternal Uncle   . Lung cancer Paternal Aunt   . Pancreatic cancer Maternal Grandmother        early 36s  . Prostate cancer Maternal Grandfather        61s  . Skin cancer  Paternal Grandmother        untreated BCC  . COPD Maternal Aunt   . Lung cancer Maternal Aunt   . Breast cancer Cousin 56       maternal first cousin  . BRCA 1/2 Daughter        BRCA1  . Esophageal cancer Cousin        maternal first cousin  . Bladder Cancer Paternal Aunt   . Colon cancer Neg Hx   . Rectal cancer Neg Hx   . Ulcerative colitis Neg Hx   . Stomach cancer Neg Hx    the patient's 2 daughters have been tested for the BRCA gene and both are positive. One of them had breast cancer at the age of 1. In addition on the maternal side there is pancreatic prostate breast and esophageal cancer. The patient's father died at the age of 28 and a car accident and there is very little information on his side of the family. The patient's mother died at 35 from COPD. She had had a hysterectomy in her 18s. The patient's mother had 5 siblings several from died young. The patient herself had no sisters, 2 brothers.   GYNECOLOGIC HISTORY:  No LMP recorded. Patient has had a hysterectomy.  Menarche age 2, first live birth age 77, the patient is  Vicki Hale P2. She underwent TAH/BSO in 2003 for her ovarian cancer. She took hormone replacement for approximately 6 months. She was on oral contraceptives for approximately 4 years remotely, with no complications.   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 Her husband Shanon Brow works for a [employer]. Daughter Sherrelle Prochazka lives in [place] where she works as a school principal. She is 35 years old. Daughter Doni Bacha lives in [place] and is a Pharmacist, hospital. She is 68 years old. These ages are as of January 2017. The patient has 4 grandchildren. She attends a [employer]     ADVANCED DIRECTIVES: Not in place   HEALTH MAINTENANCE: Social History   Tobacco Use  . Smoking status: Never Smoker  . Smokeless tobacco: Never Used  Vaping Use  . Vaping Use: Never used    Substance Use Topics  . Alcohol use: Yes    Alcohol/week: 0.0 standard drinks    Comment: ocassional; once monthly  . Drug use: No     Colonoscopy: September 2016/ [facility]  PAP:  Bone density: 2016  Lipid panel:  Allergies  Allergen Reactions  . Taxotere [Docetaxel] Rash  . Penicillins Itching and Rash    Has patient had a PCN reaction causing immediate rash, facial/tongue/throat swelling, SOB or lightheadedness with hypotension: No Has patient had a PCN reaction causing severe rash involving mucus membranes or skin necrosis: No Has patient had a PCN reaction that required hospitalization No Has patient had a PCN reaction occurring within the last 10 years: No If all of the above answers are "NO", then may proceed with Cephalosporin use.     Current Outpatient Medications  Medication Sig Dispense Refill  . anastrozole (ARIMIDEX) 1 MG tablet TAKE 1 TABLET BY MOUTH EVERY DAY 90 tablet 4  . Ascorbic Acid (VITAMIN C) 1000 MG tablet Take 1,000 mg by mouth daily.     . B Complex Vitamins (B COMPLEX PO) Take 2 tablets by mouth daily.    Marland Kitchen loratadine (CLARITIN) 10 MG tablet Take 10 mg by mouth daily.    . Multiple Vitamin (MULTIVITAMIN WITH MINERALS) TABS tablet Take 2 tablets by mouth daily.    . Vitamin D, Cholecalciferol, 1000 units CAPS Take by mouth.    . Vitamins/Minerals TABS Take by mouth.     No current facility-administered medications for this visit.    OBJECTIVE: White woman in no acute distress  Vitals:   08/12/19 1144  BP: (!) 152/64  Pulse: 61  Resp: 18  Temp: 97.7 F (36.5 C)  SpO2: 98%     Body mass index is 28 kg/m.    ECOG FS:1 - Symptomatic but completely ambulatory  Sclerae unicteric, EOMs intact Wearing a mask No cervical or supraclavicular adenopathy Lungs no rales or rhonchi Heart regular rate and rhythm Abd soft, nontender, positive bowel sounds MSK mild to moderate lumbosacral spinal tenderness, no upper extremity lymphedema Neuro: nonfocal,  well oriented, appropriate affect Breasts: Status post bilateral mastectomy.  Is no evidence of chest wall recurrence.  Both axillae are benign.  LAB RESULTS:  CMP     Component Value Date/Time   NA 140 08/08/2018 1036   NA 142 07/15/2016 0828   K 4.9 08/08/2018 1036   K 4.2 07/15/2016 0828   CL 106 08/08/2018 1036   CO2 25 08/08/2018 1036   CO2 25 07/15/2016 0828   GLUCOSE 96 08/08/2018 1036   GLUCOSE  94 07/15/2016 0828   BUN 19 08/08/2018 1036   BUN 14.8 07/15/2016 0828   CREATININE 0.80 11/12/2018 0842   CREATININE 0.91 08/08/2018 1036   CREATININE 0.8 07/15/2016 0828   CALCIUM 9.8 08/08/2018 1036   CALCIUM 9.9 07/15/2016 0828   PROT 7.1 08/08/2018 1036   PROT 6.6 07/15/2016 0828   ALBUMIN 3.9 08/08/2018 1036   ALBUMIN 3.8 07/15/2016 0828   AST 19 08/08/2018 1036   AST 22 07/15/2016 0828   ALT 16 08/08/2018 1036   ALT 19 07/15/2016 0828   ALKPHOS 102 08/08/2018 1036   ALKPHOS 103 07/15/2016 0828   BILITOT 0.4 08/08/2018 1036   BILITOT 0.39 07/15/2016 0828   GFRNONAA >60 08/08/2018 1036   GFRAA >60 08/08/2018 1036    INo results found for: SPEP, UPEP  Lab Results  Component Value Date   WBC 5.9 08/12/2019   NEUTROABS 4.0 08/12/2019   HGB 13.7 08/12/2019   HCT 42.2 08/12/2019   MCV 91.7 08/12/2019   PLT 321 08/12/2019      Chemistry      Component Value Date/Time   NA 140 08/08/2018 1036   NA 142 07/15/2016 0828   K 4.9 08/08/2018 1036   K 4.2 07/15/2016 0828   CL 106 08/08/2018 1036   CO2 25 08/08/2018 1036   CO2 25 07/15/2016 0828   BUN 19 08/08/2018 1036   BUN 14.8 07/15/2016 0828   CREATININE 0.80 11/12/2018 0842   CREATININE 0.91 08/08/2018 1036   CREATININE 0.8 07/15/2016 0828      Component Value Date/Time   CALCIUM 9.8 08/08/2018 1036   CALCIUM 9.9 07/15/2016 0828   ALKPHOS 102 08/08/2018 1036   ALKPHOS 103 07/15/2016 0828   AST 19 08/08/2018 1036   AST 22 07/15/2016 0828   ALT 16 08/08/2018 1036   ALT 19 07/15/2016 0828   BILITOT  0.4 08/08/2018 1036   BILITOT 0.39 07/15/2016 0828      No results found for: LABCA2  No components found for: LABCA125  No results for input(s): INR in the last 168 hours.  Urinalysis No results found for: COLORURINE, APPEARANCEUR, LABSPEC, PHURINE, GLUCOSEU, HGBUR, BILIRUBINUR, KETONESUR, PROTEINUR, UROBILINOGEN, NITRITE, LEUKOCYTESUR  STUDIES: No results found.   ASSESSMENT: 69 y.o. BRCA1 positive [place]  (1) ovarian cancer stage IIIB diagnosed April 2003  (a) s/p supracervical hysterectomy and BSO with surgical staging April 2003  (b) treated according to GOG 182: carboplatin/ topotecan x4 followed by carboplatin/taxol x4  (c) last chemotherapy dose October 2003  (2) status post left breast upper outer quadrant biopsy 01/28/2015 for an invasive ductal carcinoma, grade 2, estrogen and progesterone receptor positive, HER-2 not amplified, with an MIB-1 of 10%  (a) tamoxifen started neoadjuvantly due to concerns regarding surgical delay, interrupted with chemotherapy  (3) status post bilateral mastectomies with left axillary lymph node sampling 03/18/2015 showing  (a) on the right, benign disease  (b) on the left, a pT1c pN0, stage IA invasive ductal carcinoma, repeat HER-2 again negative, stage IA    (4) Oncotype DX score of 29, in the high intermediate range, predicts a risk of recurrence with tamoxifen alone of 19%. The addition of chemotherapy in this setting is predicted to decrease distant disease-free recurrence by approximately 6%.   (5) adjuvant chemotherapy with cyclophosphamide and docetaxel started 04/22/2015, repeated every 21 days 4  (a) gemcitabine switched for docetaxel for cycles 3 and 4 due to rash.  (b) chemotherapy completed 06/24/2015  (6) adjuvant anastrozole started 07/29/2015  (  a) bone density at [facility] 06/09/2014 shows a T score of -2.4; this is increased from prior  (b) bone density at [facility] 07/08/2016 shows a T score of -2.3, which is  stable  (7) pancreatic cancer screening  (a) status post MRCP 11/12/2018 showing no evidence of pancreatic cancer   PLAN: Lindora is now a little more than 4 years out from definitive surgery for her breast cancer with no evidence of disease recurrence.  This is very favorable.  She is tolerating letrozole well and the plan is to continue that a total of 5 years which means at the next visit she will be ready to discontinue that.  She does have osteopenia now and she is continuing on anastrozole.  She was unable to tolerate bisphosphonates orally, which caused a significant rash.  I think she would be a good candidate for denosumab/Prolia.  She understands the possible toxicities side effects and complications of that agent and I strongly urged her to consider it.  She is interested in our pancreatic cancer screening protocol and I have referred her to Dr. Bryan Lemma to implement that.  As far as the pain in the lumbosacral area I do not think she is likely to have had a compression fracture but she might have.  I am going to obtain a plain films of the lumbosacral spine today and we will get her those results.  She knows to call for any other issue that may develop before the next visit here, which will be in 1 year.  Total encounter time 30 minutes.Sarajane Jews C. Sheril Hammond, MD  08/12/19 11:59 AM Medical Oncology and Hematology [facility] [place], [place] [address] Tel. [phone]    Fax. [phone]    I, Wilburn Mylar, am acting as scribe for Dr. Virgie Dad. Suki Crockett.  I, Lurline Del MD, have reviewed the above documentation for accuracy and completeness, and I agree with the above.    *Total Encounter Time as defined by the Centers for Medicare and Medicaid Services includes, in addition to the face-to-face time of a patient visit (documented in the note above) non-face-to-face time: obtaining and reviewing outside history, ordering and reviewing  medications, tests or procedures, care coordination (communications with other health care professionals or caregivers) and documentation in the medical record.

## 2019-08-12 ENCOUNTER — Inpatient Hospital Stay (HOSPITAL_BASED_OUTPATIENT_CLINIC_OR_DEPARTMENT_OTHER): Payer: Medicare Other | Admitting: Oncology

## 2019-08-12 ENCOUNTER — Other Ambulatory Visit: Payer: Self-pay

## 2019-08-12 ENCOUNTER — Other Ambulatory Visit: Payer: Self-pay | Admitting: *Deleted

## 2019-08-12 ENCOUNTER — Inpatient Hospital Stay: Payer: Medicare Other | Attending: Oncology

## 2019-08-12 ENCOUNTER — Ambulatory Visit (HOSPITAL_COMMUNITY)
Admission: RE | Admit: 2019-08-12 | Discharge: 2019-08-12 | Disposition: A | Discharge status: Home / Self Care | Payer: Medicare Other | Source: Ambulatory Visit | Attending: Oncology | Admitting: Oncology

## 2019-08-12 VITALS — BP 152/64 | HR 61 | Temp 97.7°F | Resp 18 | Ht 67.0 in | Wt 178.8 lb

## 2019-08-12 DIAGNOSIS — C50412 Malignant neoplasm of upper-outer quadrant of left female breast: Secondary | ICD-10-CM | POA: Insufficient documentation

## 2019-08-12 DIAGNOSIS — M4854XA Collapsed vertebra, not elsewhere classified, thoracic region, initial encounter for fracture: Secondary | ICD-10-CM | POA: Diagnosis not present

## 2019-08-12 DIAGNOSIS — Z79811 Long term (current) use of aromatase inhibitors: Secondary | ICD-10-CM | POA: Insufficient documentation

## 2019-08-12 DIAGNOSIS — Z801 Family history of malignant neoplasm of trachea, bronchus and lung: Secondary | ICD-10-CM | POA: Insufficient documentation

## 2019-08-12 DIAGNOSIS — C569 Malignant neoplasm of unspecified ovary: Secondary | ICD-10-CM | POA: Diagnosis not present

## 2019-08-12 DIAGNOSIS — C50912 Malignant neoplasm of unspecified site of left female breast: Secondary | ICD-10-CM | POA: Diagnosis not present

## 2019-08-12 DIAGNOSIS — Z800 Family history of malignant neoplasm of digestive organs: Secondary | ICD-10-CM | POA: Diagnosis not present

## 2019-08-12 DIAGNOSIS — Z803 Family history of malignant neoplasm of breast: Secondary | ICD-10-CM | POA: Diagnosis not present

## 2019-08-12 DIAGNOSIS — Z1501 Genetic susceptibility to malignant neoplasm of breast: Secondary | ICD-10-CM

## 2019-08-12 DIAGNOSIS — Z170 Estrogen receptor positive status [ER+]: Secondary | ICD-10-CM | POA: Insufficient documentation

## 2019-08-12 DIAGNOSIS — M8580 Other specified disorders of bone density and structure, unspecified site: Secondary | ICD-10-CM | POA: Diagnosis not present

## 2019-08-12 DIAGNOSIS — Z9013 Acquired absence of bilateral breasts and nipples: Secondary | ICD-10-CM | POA: Diagnosis not present

## 2019-08-12 DIAGNOSIS — Z1509 Genetic susceptibility to other malignant neoplasm: Secondary | ICD-10-CM | POA: Diagnosis not present

## 2019-08-12 DIAGNOSIS — Z8042 Family history of malignant neoplasm of prostate: Secondary | ICD-10-CM | POA: Insufficient documentation

## 2019-08-12 DIAGNOSIS — Z8052 Family history of malignant neoplasm of bladder: Secondary | ICD-10-CM | POA: Diagnosis not present

## 2019-08-12 LAB — CMP (CANCER CENTER ONLY)
ALT: 17 U/L (ref 0–44)
AST: 20 U/L (ref 15–41)
Albumin: 3.9 g/dL (ref 3.5–5.0)
Alkaline Phosphatase: 117 U/L (ref 38–126)
Anion gap: 8 (ref 5–15)
BUN: 17 mg/dL (ref 8–23)
CO2: 24 mmol/L (ref 22–32)
Calcium: 10 mg/dL (ref 8.9–10.3)
Chloride: 107 mmol/L (ref 98–111)
Creatinine: 0.85 mg/dL (ref 0.44–1.00)
GFR, Est AFR Am: >60 mL/min (ref >60)
GFR, Estimated: >60 mL/min (ref >60)
Glucose, Bld: 89 mg/dL (ref 70–99)
Potassium: 4.5 mmol/L (ref 3.5–5.1)
Sodium: 139 mmol/L (ref 135–145)
Total Bilirubin: 0.5 mg/dL (ref 0.3–1.2)
Total Protein: 7.2 g/dL (ref 6.5–8.1)

## 2019-08-12 LAB — CBC WITH DIFFERENTIAL (CANCER CENTER ONLY)
Abs Immature Granulocytes: 0.01 10*3/uL (ref 0.00–0.07)
Basophils Absolute: 0.1 10*3/uL (ref 0.0–0.1)
Basophils Relative: 1 %
Eosinophils Absolute: 0.3 10*3/uL (ref 0.0–0.5)
Eosinophils Relative: 5 %
HCT: 42.2 % (ref 36.0–46.0)
Hemoglobin: 13.7 g/dL (ref 12.0–15.0)
Immature Granulocytes: 0 %
Lymphocytes Relative: 18 %
Lymphs Abs: 1.1 10*3/uL (ref 0.7–4.0)
MCH: 29.8 pg (ref 26.0–34.0)
MCHC: 32.5 g/dL (ref 30.0–36.0)
MCV: 91.7 fL (ref 80.0–100.0)
Monocytes Absolute: 0.5 10*3/uL (ref 0.1–1.0)
Monocytes Relative: 8 %
Neutro Abs: 4 10*3/uL (ref 1.7–7.7)
Neutrophils Relative %: 68 %
Platelet Count: 321 10*3/uL (ref 150–400)
RBC: 4.6 MIL/uL (ref 3.87–5.11)
RDW: 12.5 % (ref 11.5–15.5)
WBC Count: 5.9 10*3/uL (ref 4.0–10.5)
nRBC: 0 % (ref 0.0–0.2)

## 2019-08-12 MED ORDER — ANASTROZOLE 1 MG PO TABS: 1.0000 mg | ORAL_TABLET | Freq: Every day | ORAL | 4 refills | Status: AC

## 2019-08-13 ENCOUNTER — Telehealth: Payer: Self-pay | Admitting: Oncology

## 2019-08-13 NOTE — Telephone Encounter (Signed)
Scheduled appts per 7/26 los. Left voicemail with appt date and time.  

## 2019-08-14 ENCOUNTER — Encounter: Payer: Self-pay | Admitting: Oncology

## 2019-08-16 ENCOUNTER — Telehealth: Payer: Self-pay

## 2019-08-16 NOTE — Telephone Encounter (Signed)
Called pt because she would like to know who she should see in Ortho. Per Dr Jana Hakim, he advises she see Dr Suella Broad at [facility]. Pt given this information to call and set up appt. If pt needs referral she understands she should call and let us know. Pt verbalized thanks and understanding.

## 2019-08-20 DIAGNOSIS — N6489 Other specified disorders of breast: Secondary | ICD-10-CM | POA: Diagnosis not present

## 2019-09-05 ENCOUNTER — Encounter: Payer: Self-pay | Admitting: Internal Medicine

## 2019-10-04 DIAGNOSIS — M8580 Other specified disorders of bone density and structure, unspecified site: Secondary | ICD-10-CM | POA: Diagnosis not present

## 2019-10-30 ENCOUNTER — Ambulatory Visit (AMBULATORY_SURGERY_CENTER): Payer: Medicare Other

## 2019-10-30 ENCOUNTER — Other Ambulatory Visit: Payer: Self-pay

## 2019-10-30 VITALS — Ht 67.0 in | Wt 172.0 lb

## 2019-10-30 DIAGNOSIS — Z86010 Personal history of colonic polyps: Secondary | ICD-10-CM

## 2019-10-30 MED ORDER — PLENVU 140 G PO SOLR: 1.0000 | ORAL | 0 refills | Status: DC

## 2019-10-30 NOTE — Progress Notes (Signed)
Pre Visit completed via phone today; Patient verified name, DOB, and address at time of pre visit; No egg or soy allergy known to patient  No issues with past sedation with any surgeries or procedures No intubation problems in the past  No FH of Malignant Hyperthermia No diet pills per patient No home 02 use per patient  No blood thinners per patient  Pt denies issues with constipation  No A fib or A flutter  EMMI video via [place] 19 guidelines implemented in PV today with Pt and RN  Coupon given to pt in PV today , Code to Pharmacy  COVID vaccines completed on 02/2019 per pt;  Due to the COVID-19 pandemic we are asking patients to follow these guidelines. Please only bring one care partner. Please be aware that your care partner may wait in the car in the parking lot or if they feel like they will be too hot to wait in the car, they may wait in the lobby on the 4th floor. All care partners are required to wear a mask the entire time (we do not have any that we can provide them), they need to practice social distancing, and we will do a Covid check for all patient's and care partners when you arrive. Also we will check their temperature and your temperature. If the care partner waits in their car they need to stay in the parking lot the entire time and we will call them on their cell phone when the patient is ready for discharge so they can bring the car to the front of the building. Also all patient's will need to wear a mask into building.

## 2019-11-07 ENCOUNTER — Telehealth: Payer: Self-pay | Admitting: Internal Medicine

## 2019-11-07 NOTE — Telephone Encounter (Signed)
Pt states her PLENVU is not covered by her insurance and would like to know if she could have an alternative.

## 2019-11-12 ENCOUNTER — Encounter: Payer: Self-pay | Admitting: Internal Medicine

## 2019-11-12 ENCOUNTER — Ambulatory Visit (AMBULATORY_SURGERY_CENTER): Payer: Medicare Other | Admitting: Internal Medicine

## 2019-11-12 ENCOUNTER — Encounter: Payer: Medicare Other | Admitting: Internal Medicine

## 2019-11-12 ENCOUNTER — Other Ambulatory Visit: Payer: Self-pay

## 2019-11-12 VITALS — BP 141/69 | HR 63 | Temp 97.8°F | Resp 17 | Ht 67.0 in | Wt 172.0 lb

## 2019-11-12 DIAGNOSIS — Z86010 Personal history of colonic polyps: Secondary | ICD-10-CM

## 2019-11-12 DIAGNOSIS — D128 Benign neoplasm of rectum: Secondary | ICD-10-CM

## 2019-11-12 DIAGNOSIS — Z1211 Encounter for screening for malignant neoplasm of colon: Secondary | ICD-10-CM | POA: Diagnosis not present

## 2019-11-12 DIAGNOSIS — D122 Benign neoplasm of ascending colon: Secondary | ICD-10-CM

## 2019-11-12 DIAGNOSIS — D123 Benign neoplasm of transverse colon: Secondary | ICD-10-CM

## 2019-11-12 DIAGNOSIS — K621 Rectal polyp: Secondary | ICD-10-CM

## 2019-11-12 DIAGNOSIS — D120 Benign neoplasm of cecum: Secondary | ICD-10-CM | POA: Diagnosis not present

## 2019-11-12 HISTORY — PX: COLONOSCOPY: SHX174

## 2019-11-12 MED ORDER — SODIUM CHLORIDE 0.9 % IV SOLN: 500.0000 mL | Freq: Once | INTRAVENOUS | Status: DC

## 2019-11-12 NOTE — Progress Notes (Signed)
Pt's states no medical or surgical changes since previsit or office visit.   VS taken by CW 

## 2019-11-12 NOTE — Patient Instructions (Signed)
Handout on polyps and diverticulosis given    YOU HAD AN ENDOSCOPIC PROCEDURE TODAY AT THE [facility]:   Refer to the procedure report that was given to you for any specific questions about what was found during the examination.  If the procedure report does not answer your questions, please call your gastroenterologist to clarify.  If you requested that your care partner not be given the details of your procedure findings, then the procedure report has been included in a sealed envelope for you to review at your convenience later.  YOU SHOULD EXPECT: Some feelings of bloating in the abdomen. Passage of more gas than usual.  Walking can help get rid of the air that was put into your GI tract during the procedure and reduce the bloating. If you had a lower endoscopy (such as a colonoscopy or flexible sigmoidoscopy) you may notice spotting of blood in your stool or on the toilet paper. If you underwent a bowel prep for your procedure, you may not have a normal bowel movement for a few days.  Please Note:  You might notice some irritation and congestion in your nose or some drainage.  This is from the oxygen used during your procedure.  There is no need for concern and it should clear up in a day or so.  SYMPTOMS TO REPORT IMMEDIATELY:   Following lower endoscopy (colonoscopy or flexible sigmoidoscopy):  Excessive amounts of blood in the stool  Significant tenderness or worsening of abdominal pains  Swelling of the abdomen that is new, acute  Fever of 100F or higher   For urgent or emergent issues, a gastroenterologist can be reached at any hour by calling [phone]. Do not use MyChart messaging for urgent concerns.    DIET:  We do recommend a small meal at first, but then you may proceed to your regular diet.  Drink plenty of fluids but you should avoid alcoholic beverages for 24 hours.  ACTIVITY:  You should plan to take it easy for the rest of today and you should NOT  DRIVE or use heavy machinery until tomorrow (because of the sedation medicines used during the test).    FOLLOW UP: Our staff will call the number listed on your records 48-72 hours following your procedure to check on you and address any questions or concerns that you may have regarding the information given to you following your procedure. If we do not reach you, we will leave a message.  We will attempt to reach you two times.  During this call, we will ask if you have developed any symptoms of COVID 19. If you develop any symptoms (ie: fever, flu-like symptoms, shortness of breath, cough etc.) before then, please call [phone].  If you test positive for Covid 19 in the 2 weeks post procedure, please call and report this information to us.    If any biopsies were taken you will be contacted by phone or by letter within the next 1-3 weeks.  Please call us at [phone] if you have not heard about the biopsies in 3 weeks.    SIGNATURES/CONFIDENTIALITY: You and/or your care partner have signed paperwork which will be entered into your electronic medical record.  These signatures attest to the fact that that the information above on your After Visit Summary has been reviewed and is understood.  Full responsibility of the confidentiality of this discharge information lies with you and/or your care-partner. 

## 2019-11-12 NOTE — Progress Notes (Signed)
Called to room to assist during endoscopic procedure.  Patient ID and intended procedure confirmed with present staff. Received instructions for my participation in the procedure from the performing physician.  

## 2019-11-12 NOTE — Progress Notes (Signed)
A and O x3. Report to RN. Tolerated MAC anesthesia well.

## 2019-11-12 NOTE — Op Note (Signed)
[facility] Patient Name: Amerah Puleo Procedure Date: 11/12/2019 9:32 AM MRN: [id number] Endoscopist: Jerene Bears , MD Age: 69 Referring MD:  Date of Birth: 1950-07-17 Gender: Female Account #: [id number] Procedure:                Colonoscopy Indications:              High risk colon cancer surveillance: Personal                            history of non-advanced adenoma, Last colonoscopy:                            July 2016 Medicines:                Monitored Anesthesia Care Procedure:                Pre-Anesthesia Assessment:                           - Prior to the procedure, a History and Physical                            was performed, and patient medications and                            allergies were reviewed. The patient's tolerance of                            previous anesthesia was also reviewed. The risks                            and benefits of the procedure and the sedation                            options and risks were discussed with the patient.                            All questions were answered, and informed consent                            was obtained. Prior Anticoagulants: The patient has                            taken no previous anticoagulant or antiplatelet                            agents. ASA Grade Assessment: II - A patient with                            mild systemic disease. After reviewing the risks                            and benefits, the patient was deemed in  satisfactory condition to undergo the procedure.                           After obtaining informed consent, the colonoscope                            was passed under direct vision. Throughout the                            procedure, the patient's blood pressure, pulse, and                            oxygen saturations were monitored continuously. The                            Colonoscope was introduced through the anus and                             advanced to the cecum, identified by appendiceal                            orifice and ileocecal valve. The colonoscopy was                            performed without difficulty. The patient tolerated                            the procedure well. The quality of the bowel                            preparation was excellent. The ileocecal valve,                            appendiceal orifice, and rectum were photographed. Scope In: 9:40:35 AM Scope Out: 9:57:53 AM Scope Withdrawal Time: 0 hours 12 minutes 57 seconds  Total Procedure Duration: 0 hours 17 minutes 18 seconds  Findings:                 The digital rectal exam was normal.                           A 3 mm polyp was found in the cecum. The polyp was                            sessile. The polyp was removed with a cold snare.                            Resection and retrieval were complete.                           Two sessile polyps were found in the ascending                            colon. The polyps were 4 to  5 mm in size. These                            polyps were removed with a cold snare. Resection                            and retrieval were complete.                           A 5 mm polyp was found in the transverse colon. The                            polyp was sessile. The polyp was removed with a                            cold snare. Resection and retrieval were complete.                           A 2 mm polyp was found in the rectum. The polyp was                            sessile. The polyp was removed with a cold snare.                            Resection and retrieval were complete.                           A few small-mouthed diverticula were found in the                            ascending colon.                           The retroflexed view of the distal rectum and anal                            verge was normal and showed no anal or rectal                             abnormalities. Complications:            No immediate complications. Estimated Blood Loss:     Estimated blood loss was minimal. Impression:               - One 3 mm polyp in the cecum, removed with a cold                            snare. Resected and retrieved.                           - Two 4 to 5 mm polyps in the ascending colon,                            removed with a cold  snare. Resected and retrieved.                           - One 5 mm polyp in the transverse colon, removed                            with a cold snare. Resected and retrieved.                           - One 2 mm polyp in the rectum, removed with a cold                            snare. Resected and retrieved.                           - Diverticulosis in the ascending colon.                           - The distal rectum and anal verge are normal on                            retroflexion view. Recommendation:           - Patient has a contact number available for                            emergencies. The signs and symptoms of potential                            delayed complications were discussed with the                            patient. Return to normal activities tomorrow.                            Written discharge instructions were provided to the                            patient.                           - Resume previous diet.                           - Continue present medications.                           - Await pathology results.                           - Repeat colonoscopy is recommended for                            surveillance. The colonoscopy date will be  determined after pathology results from today's                            exam become available for review. Jerene Bears, MD 11/12/2019 10:02:16 AM This report has been signed electronically.

## 2019-11-14 ENCOUNTER — Telehealth: Payer: Self-pay | Admitting: *Deleted

## 2019-11-14 NOTE — Telephone Encounter (Signed)
°  Follow up Call-  Call back number 11/12/2019  Post procedure Call Back phone  # [phone]  Permission to leave phone message Yes  Some recent data might be hidden     Patient questions:  Do you have a fever, pain , or abdominal swelling? No. Pain Score  0 *  Have you tolerated food without any problems? Yes.    Have you been able to return to your normal activities? Yes.    Do you have any questions about your discharge instructions: Diet   No. Medications  No. Follow up visit  No.  Do you have questions or concerns about your Care? No.  Actions: * If pain score is 4 or above: No action needed, pain <4.  1. Have you developed a fever since your procedure? no  2.   Have you had an respiratory symptoms (SOB or cough) since your procedure? no  3.   Have you tested positive for COVID 19 since your procedure no  4.   Have you had any family members/close contacts diagnosed with the COVID 19 since your procedure?  no   If yes to any of these questions please route to Joylene John, RN and Joella Prince, RN

## 2019-11-19 ENCOUNTER — Encounter: Payer: Self-pay | Admitting: Internal Medicine

## 2019-12-30 ENCOUNTER — Encounter: Payer: Medicare Other | Admitting: Internal Medicine

## 2020-01-28 DIAGNOSIS — Z23 Encounter for immunization: Secondary | ICD-10-CM | POA: Diagnosis not present

## 2020-04-08 DIAGNOSIS — H31091 Other chorioretinal scars, right eye: Secondary | ICD-10-CM | POA: Diagnosis not present

## 2020-04-08 DIAGNOSIS — H33301 Unspecified retinal break, right eye: Secondary | ICD-10-CM | POA: Diagnosis not present

## 2020-04-08 DIAGNOSIS — H33321 Round hole, right eye: Secondary | ICD-10-CM | POA: Diagnosis not present

## 2020-04-08 DIAGNOSIS — H2513 Age-related nuclear cataract, bilateral: Secondary | ICD-10-CM | POA: Diagnosis not present

## 2020-05-05 DIAGNOSIS — Z9181 History of falling: Secondary | ICD-10-CM | POA: Diagnosis not present

## 2020-05-05 DIAGNOSIS — Z1331 Encounter for screening for depression: Secondary | ICD-10-CM | POA: Diagnosis not present

## 2020-05-05 DIAGNOSIS — Z79899 Other long term (current) drug therapy: Secondary | ICD-10-CM | POA: Diagnosis not present

## 2020-05-05 DIAGNOSIS — J3089 Other allergic rhinitis: Secondary | ICD-10-CM | POA: Diagnosis not present

## 2020-05-05 DIAGNOSIS — C50912 Malignant neoplasm of unspecified site of left female breast: Secondary | ICD-10-CM | POA: Diagnosis not present

## 2020-05-05 DIAGNOSIS — M8580 Other specified disorders of bone density and structure, unspecified site: Secondary | ICD-10-CM | POA: Diagnosis not present

## 2020-05-05 DIAGNOSIS — E7800 Pure hypercholesterolemia, unspecified: Secondary | ICD-10-CM | POA: Diagnosis not present

## 2020-05-05 DIAGNOSIS — L309 Dermatitis, unspecified: Secondary | ICD-10-CM | POA: Diagnosis not present

## 2020-05-05 DIAGNOSIS — Z139 Encounter for screening, unspecified: Secondary | ICD-10-CM | POA: Diagnosis not present

## 2020-05-12 ENCOUNTER — Encounter: Payer: Self-pay | Admitting: Oncology

## 2020-05-13 ENCOUNTER — Encounter: Payer: Self-pay | Admitting: Oncology

## 2020-05-24 ENCOUNTER — Other Ambulatory Visit: Payer: Self-pay | Admitting: Oncology

## 2020-05-25 ENCOUNTER — Telehealth: Payer: Self-pay | Admitting: Oncology

## 2020-05-25 NOTE — Telephone Encounter (Signed)
Rescheduled appointment per 05/08 schedule message. Patient is aware. 

## 2020-08-12 ENCOUNTER — Other Ambulatory Visit: Payer: Medicare Other

## 2020-08-12 ENCOUNTER — Ambulatory Visit: Payer: Medicare Other | Admitting: Oncology

## 2020-08-17 ENCOUNTER — Other Ambulatory Visit: Payer: Self-pay | Admitting: *Deleted

## 2020-08-17 DIAGNOSIS — C50412 Malignant neoplasm of upper-outer quadrant of left female breast: Secondary | ICD-10-CM

## 2020-08-17 DIAGNOSIS — Z170 Estrogen receptor positive status [ER+]: Secondary | ICD-10-CM

## 2020-08-19 ENCOUNTER — Other Ambulatory Visit: Payer: Medicare Other

## 2020-08-19 ENCOUNTER — Ambulatory Visit: Payer: Medicare Other | Admitting: Oncology

## 2020-09-09 ENCOUNTER — Inpatient Hospital Stay: Payer: Medicare Other | Admitting: Oncology

## 2020-09-09 ENCOUNTER — Inpatient Hospital Stay: Payer: Medicare Other | Attending: Family Medicine

## 2020-10-18 NOTE — Progress Notes (Signed)
[facility]  [phone] [phone]   ID: Vicki Hale DOB: 69/04/1934  MR#: [id number]  HGD#:[id number]  Patient Care Team: Leonides Sake, MD as PCP - General (Family Medicine) Mattheus Rauls, Virgie Dad, MD as Consulting Physician (Oncology) Rolm Bookbinder, MD as Consulting Physician (General Surgery) Consuella Lose, MD as Consulting Physician (Neurosurgery) Rosalin Hawking, MD as Consulting Physician (Neurology) Bernarda Caffey, MD as Consulting Physician (Ophthalmology) Clent Jacks, MD as Consulting Physician (Ophthalmology) Leandrew Koyanagi, MD as Attending Physician (Orthopedic Surgery) OTHER MD:  CHIEF COMPLAINT: Breast cancer in BRCA carrier (s/p bilateral mastectomies)  CURRENT TREATMENT: Completing 5 years of anastrozole   INTERVAL HISTORY: Amela returns today for follow-up of her estrogen receptor positive breast cancer.  She continues on anastrozole, with good tolerance.  She has never had problems with hot flashes and vaginal dryness is not a significant issue.  She has significant osteopenia however and was started on denosumab/Prolia through Dr. Onnie Boer office last year.  She is tolerating that well.  She is BRCA1 positive.  She has a family history of MGM with pancreatic cancer.  She did not have an MRCP last year.   REVIEW OF SYSTEMS: [name] is very busy, walking 2 to 3 miles most days.  She has 5 grandchildren in 5 different schools playing multiple sports and this keeps her on her toes.  She and her husband had severe COVID November 2021, before that had any vaccines, and he had a heart attack 3 months later which fortunately he recovered from without significant residuals.  He is now retired and very busy with the grandchildren as well.  She then had COVID a second time September of this year.  This time it was very mild.  Adira and her family went to [place], were exposed to significant heat and she developed a rash which is very  itchy and troublesome.  A detailed review of systems today was otherwise stable   COVID 19 VACCINATION STATUS: Status post COVID November 2021 and September 2022, status post [place] x3 as of October 2020   CANCER HISTORY: From the original intake note:  Vicki Hale was diagnosed with ovarian cancer in April 2003. She underwent total abdominal hysterectomy and bilateral salpingo-oophorectomy with a full staging operation at [facility] that month and was then treated according to GOG 182, with 4 cycles of carboplatin/topotecan and then 4 cycles of carboplatin/paclitaxel. She was then followed by gynecologic oncology through 2011, and there has been no history of recurrent disease.  On 96/22/2979 Chanel underwent screening mammography at [facility] with tomosynthesis. Breast density was category A. New grouped calcifications were noted in the left breast upper outer quadrant and the patient was recalled for diagnostic unilateral left mammography 01/26/2015. This confirmed a group of new linear calcifications in the upper outer quadrant of the left breast extending over an area of 2.0 cm..   On 01/28/2015 the patient underwent biopsy of her left breast, with the pathology ([id number]) showing an invasive ductal carcinoma, grade 2, estrogen receptor 100% positive, progesterone receptor 50% positive, both with strong staining intensity, with an MIB-1 of 10% and no HER-2 amplification, the signals ratio being 1.21 and the number per cell 2.00.  Her subsequent history is as detailed below   PAST MEDICAL HISTORY:  Past Medical History:  Diagnosis Date   Allergy    seasonal allergies   Arthritis    generalized   Breast cancer ([facility]) 2017   [facility] in [place]   Family history of breast  cancer    Family history of pancreatic cancer    FH: BRCA1 gene positive    Osteoporosis    Ovarian cancer ([facility]) 2003   Pericarditis, acute    30 YRS AGO    SAH (subarachnoid hemorrhage) ([place])    04/2014   Stroke ([facility])    no deficits     PAST SURGICAL HISTORY: Past Surgical History:  Procedure Laterality Date   BREAST RECONSTRUCTION WITH PLACEMENT OF TISSUE EXPANDER AND FLEX HD (ACELLULAR HYDRATED DERMIS) Bilateral 03/18/2015   Procedure: BILATERAL BREAST RECONSTRUCTION WITH PLACEMENT OF TISSUE EXPANDER AND FLEX HD (ACELLULAR HYDRATED DERMIS);  Surgeon: Loel Lofty Dillingham, DO;  Location: [facility];  Service: Plastics;  Laterality: Bilateral;   CESAREAN SECTION     X2   COLONOSCOPY  2016   COLONOSCOPY  11/12/2019   HERNIA REPAIR     LIPOSUCTION WITH LIPOFILLING Bilateral 12/02/2015   Procedure: LIPOSUCTION WITH LIPOFILLING FROM ABDOMEN TO BILATERAL BREAST;  Surgeon: [name], DO;  Location: [facility];  Service: Plastics;  Laterality: Bilateral;   LIPOSUCTION WITH LIPOFILLING Bilateral 03/23/2016   Procedure: BILATERAL FAT GRAFTING;  Surgeon: [name], DO;  Location: [facility];  Service: Plastics;  Laterality: Bilateral;   MASTECTOMY W/ SENTINEL NODE BIOPSY Left 03/18/2015   Procedure: LEFT TOTAL MASTECTOMY WITH LEFT AXILLARY SENTINEL LYMPH NODE BIOPSY;  Surgeon: Excell Seltzer, MD;  Location: [facility];  Service: General;  Laterality: Left;   MASTECTOMY, PARTIAL Right 03/18/2015   Procedure: RIGHT PROPHYLACTIC MASTECTOMY ;  Surgeon: Excell Seltzer, MD;  Location: [facility];  Service: General;  Laterality: Right;   POLYPECTOMY     REMOVAL OF BILATERAL TISSUE EXPANDERS WITH PLACEMENT OF BILATERAL BREAST IMPLANTS Bilateral 08/12/2015   Procedure: REMOVAL OF BILATERAL TISSUE EXPANDERS WITH PLACEMENT OF BILATERAL BREAST IMPLANTS;  Surgeon: [name], DO;  Location: [facility];  Service: Plastics;  Laterality: Bilateral;   TONSILLECTOMY     TOTAL ABDOMINAL HYSTERECTOMY W/ BILATERAL SALPINGOOPHORECTOMY      FAMILY HISTORY Family History  Problem Relation Age of Onset   Colon polyps Mother 58   Breast cancer Daughter 91   BRCA 1/2 Daughter        BRCA1    COPD Maternal Aunt    Esophageal cancer Maternal Uncle 59   Lung cancer Paternal Aunt    Pancreatic cancer Maternal Grandmother        early 30s   Prostate cancer Maternal Grandfather        62s   Skin cancer Paternal Grandmother        untreated BCC   COPD Maternal Aunt    Lung cancer Maternal Aunt    Breast cancer Cousin 44       maternal first cousin   BRCA 1/2 Daughter        BRCA1   Esophageal cancer Cousin 9       maternal first cousin   Bladder Cancer Paternal Aunt    Colon cancer Neg Hx    Rectal cancer Neg Hx    Ulcerative colitis Neg Hx    Stomach cancer Neg Hx    the patient's 2 daughters have been tested for the BRCA gene and both are positive. One of them had breast cancer at the age of 82. In addition on the maternal side there is pancreatic prostate breast and esophageal cancer. The patient's father died at the age of 24 and a car accident and there is very little information  on his side of the family. The patient's mother died at 66 from COPD. She had had a hysterectomy in her 78s. The patient's mother had 5 siblings several from died young. The patient herself had no sisters, 2 brothers.   GYNECOLOGIC HISTORY:  No LMP recorded. Patient has had a hysterectomy.  Menarche age 73, first live birth age 89, the patient is [place] P2. She underwent TAH/BSO in 2003 for her ovarian cancer. She took hormone replacement for approximately 6 months. She was on oral contraceptives for approximately 4 years remotely, with no complications.   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 Her husband Shanon Brow works for a [employer]. Daughter Desirey Keahey lives in [place] where she works as a school principal. She is 79 years old. Daughter Kalin Amrhein lives in [place] and is a Pharmacist, hospital. She is 69 years old. These ages are as of January 2017. The patient has 5 grandchildren. She attends a local [employer] DIRECTIVES: Not in place   HEALTH MAINTENANCE: Social History   Tobacco Use   Smoking status: Never   Smokeless tobacco: Never  Vaping Use   Vaping Use: Never used  Substance Use Topics   Alcohol use: Not Currently    Alcohol/week: 0.0 standard drinks   Drug use: No     Colonoscopy: September 2016/ [facility]  PAP:  Bone density: 2016  Lipid panel:  Allergies  Allergen Reactions   Taxotere [Docetaxel] Rash   Penicillins Itching and Rash    Has patient had a PCN reaction causing immediate rash, facial/tongue/throat swelling, SOB or lightheadedness with hypotension: No Has patient had a PCN reaction causing severe rash involving mucus membranes or skin necrosis: No Has patient had a PCN reaction that required hospitalization No Has patient had a PCN reaction occurring within the last 10 years: No If all of the above answers are "NO", then may proceed with Cephalosporin use.     Current Outpatient Medications  Medication Sig Dispense Refill   anastrozole (ARIMIDEX) 1 MG tablet Take 1 tablet (1 mg total) by mouth daily. 90 tablet 4   Ascorbic Acid (VITAMIN C) 1000 MG tablet Take 1,000 mg by mouth daily.      B Complex Vitamins (B COMPLEX PO) Take 2 tablets by mouth daily.     Denosumab (PROLIA [place]) Inject into the skin.     loratadine (CLARITIN) 10 MG tablet Take 10 mg by mouth daily.     Multiple Vitamin (MULTIVITAMIN WITH MINERALS) TABS tablet Take 2 tablets by mouth daily.     Turmeric (QC TUMERIC COMPLEX PO) Take by mouth.     Vitamin D, Cholecalciferol, 1000 units CAPS Take by mouth.     No current facility-administered medications for this visit.    OBJECTIVE: White woman who appears younger than stated age  52:   10/19/20 0953  BP: (!) 146/60  Pulse: (!) 59  Resp: 18  Temp: (!) 97 F (36.1 C)  SpO2: 98%     Body mass index is 27.8 kg/m.    ECOG FS:1 - Symptomatic but completely ambulatory  Sclerae unicteric, EOMs intact Wearing a mask No  cervical or supraclavicular adenopathy Lungs no rales or rhonchi Heart regular rate and rhythm Abd soft, nontender, positive bowel sounds MSK no focal spinal tenderness, no upper extremity lymphedema Neuro: nonfocal, well oriented, appropriate affect Breasts: Status post bilateral mastectomies with bilateral  reconstruction.  There is no evidence of chest wall recurrence.  Both axillae are benign Skin: She has a papular erythematous rash on the back and abdomen, but not over the breasts.  This is itchy.  It is not vesicular.  It is not confluent.  LAB RESULTS:  CMP     Component Value Date/Time   NA 139 08/12/2019 1127   NA 142 07/15/2016 0828   K 4.5 08/12/2019 1127   K 4.2 07/15/2016 0828   CL 107 08/12/2019 1127   CO2 24 08/12/2019 1127   CO2 25 07/15/2016 0828   GLUCOSE 89 08/12/2019 1127   GLUCOSE 94 07/15/2016 0828   BUN 17 08/12/2019 1127   BUN 14.8 07/15/2016 0828   CREATININE 0.85 08/12/2019 1127   CREATININE 0.8 07/15/2016 0828   CALCIUM 10.0 08/12/2019 1127   CALCIUM 9.9 07/15/2016 0828   PROT 7.2 08/12/2019 1127   PROT 6.6 07/15/2016 0828   ALBUMIN 3.9 08/12/2019 1127   ALBUMIN 3.8 07/15/2016 0828   AST 20 08/12/2019 1127   AST 22 07/15/2016 0828   ALT 17 08/12/2019 1127   ALT 19 07/15/2016 0828   ALKPHOS 117 08/12/2019 1127   ALKPHOS 103 07/15/2016 0828   BILITOT 0.5 08/12/2019 1127   BILITOT 0.39 07/15/2016 0828   GFRNONAA >60 08/12/2019 1127   GFRAA >60 08/12/2019 1127    INo results found for: SPEP, UPEP  Lab Results  Component Value Date   WBC 6.7 10/19/2020   NEUTROABS 4.3 10/19/2020   HGB 13.4 10/19/2020   HCT 40.9 10/19/2020   MCV 90.9 10/19/2020   PLT 315 10/19/2020      Chemistry      Component Value Date/Time   NA 139 08/12/2019 1127   NA 142 07/15/2016 0828   K 4.5 08/12/2019 1127   K 4.2 07/15/2016 0828   CL 107 08/12/2019 1127   CO2 24 08/12/2019 1127   CO2 25 07/15/2016 0828   BUN 17 08/12/2019 1127   BUN 14.8 07/15/2016  0828   CREATININE 0.85 08/12/2019 1127   CREATININE 0.8 07/15/2016 0828      Component Value Date/Time   CALCIUM 10.0 08/12/2019 1127   CALCIUM 9.9 07/15/2016 0828   ALKPHOS 117 08/12/2019 1127   ALKPHOS 103 07/15/2016 0828   AST 20 08/12/2019 1127   AST 22 07/15/2016 0828   ALT 17 08/12/2019 1127   ALT 19 07/15/2016 0828   BILITOT 0.5 08/12/2019 1127   BILITOT 0.39 07/15/2016 0828      No results found for: LABCA2  No components found for: LABCA125  No results for input(s): INR in the last 168 hours.  Urinalysis No results found for: COLORURINE, APPEARANCEUR, LABSPEC, PHURINE, GLUCOSEU, HGBUR, BILIRUBINUR, KETONESUR, PROTEINUR, UROBILINOGEN, NITRITE, LEUKOCYTESUR   STUDIES: No results found.   ASSESSMENT: 70 y.o. BRCA1 positive [place], [place] woman  (1) ovarian cancer stage IIIB diagnosed April 2003  (a) s/p supracervical hysterectomy and BSO with surgical staging April 2003  (b) treated according to GOG 182: carboplatin/ topotecan x4 followed by carboplatin/taxol x4  (c) last chemotherapy dose October 2003  (2) status post left breast upper outer quadrant biopsy 01/28/2015 for an invasive ductal carcinoma, grade 2, estrogen and progesterone receptor positive, HER-2 not amplified, with an MIB-1 of 10%  (a) tamoxifen started neoadjuvantly due to concerns regarding surgical delay, interrupted with chemotherapy  (3) status post bilateral mastectomies with left axillary lymph node sampling 03/18/2015 showing  (a) on the right, benign disease  (b) on the  left, a pT1c pN0, stage IA invasive ductal carcinoma, repeat HER-2 again negative, stage IA    (4) Oncotype DX score of 29, in the high intermediate range, predicts a risk of recurrence with tamoxifen alone of 19%. The addition of chemotherapy in this setting is predicted to decrease distant disease-free recurrence by approximately 6%.   (5) adjuvant chemotherapy with cyclophosphamide and docetaxel started 04/22/2015, repeated  every 21 days 4  (a) gemcitabine switched for docetaxel for cycles 3 and 4 due to rash.  (b) chemotherapy completed 06/24/2015  (6) adjuvant anastrozole started 07/29/2015, completing 5+ years October 2022  (a) bone density at [facility] 06/09/2014 shows a T score of -2.4; this is increased from prior  (b) bone density at [facility] 07/08/2016 shows a T score of -2.3, which is stable  (c) denosumab/Prolia started through Dr. Onnie Boer office 2021 (d) bone density at [facility] 07/30/2019 shows a T score of -2.2, stable  (7) pancreatic cancer screening  (a) status post MRCP 11/12/2018 showing no evidence of pancreatic cancer  (b) repeat MRCP November 4166  PLAN: Heena is now 0-6/3 years out from definitive surgery for her breast cancer with no evidence of disease recurrence.  This is very favorable.  She is just over 5 years on the anastrozole.  We do have data to continue anastrozole up to 7 years.  However I usually stop at 5 years unless the patient is at high risk for recurrence, for example with a very large tumor or node positive disease.  That is not the case here and so we are stopping anastrozole at this point.  That should also help with her bone building program  She understands that denosumab/Prolia also reduces the risk of breast cancer as a secondary effect  We discussed screening for pancreatic cancer.  She understands we do not have validated screening methods.  I think it is reasonable in her case given her BRCA mutation and family history to obtain an MRCP yearly.  Hopefully this would allow [place] to find pancreatic cancer, if it occurs, at the earliest possible time.  I have gone ahead and placed that order for her to have that done sometime within the next month  I am not sure what the rash she is suffering from is due to.  It is not medication related as far as I can tell.  It does not appear to be candidal.  I suspect her primary care physician will start her on a steroid when she sees the  patient later today.  Otherwise she will return to see [place] again in 1 year.  She knows to call for any other issue that may develop before that visit.  Total encounter time 35 minutes.Sarajane Jews C. Jassen Sarver, MD  10/19/20 10:12 AM Medical Oncology and Hematology [facility] [place], [place] [address] Tel. [phone]    Fax. [phone]    I, Wilburn Mylar, am acting as scribe for Dr. Virgie Dad. Giabella Duhart.  I, Lurline Del MD, have reviewed the above documentation for accuracy and completeness, and I agree with the above.   *Total Encounter Time as defined by the Centers for Medicare and Medicaid Services includes, in addition to the face-to-face time of a patient visit (documented in the note above) non-face-to-face time: obtaining and reviewing outside history, ordering and reviewing medications, tests or procedures, care coordination (communications with other health care professionals or caregivers) and documentation in the medical record.

## 2020-10-19 ENCOUNTER — Other Ambulatory Visit: Payer: Self-pay

## 2020-10-19 ENCOUNTER — Inpatient Hospital Stay (HOSPITAL_BASED_OUTPATIENT_CLINIC_OR_DEPARTMENT_OTHER): Payer: Medicare Other | Admitting: Oncology

## 2020-10-19 ENCOUNTER — Inpatient Hospital Stay: Payer: Medicare Other | Attending: Family Medicine

## 2020-10-19 VITALS — BP 146/60 | HR 59 | Temp 97.0°F | Resp 18 | Wt 177.5 lb

## 2020-10-19 DIAGNOSIS — Z79811 Long term (current) use of aromatase inhibitors: Secondary | ICD-10-CM | POA: Insufficient documentation

## 2020-10-19 DIAGNOSIS — Z79899 Other long term (current) drug therapy: Secondary | ICD-10-CM | POA: Insufficient documentation

## 2020-10-19 DIAGNOSIS — Z90722 Acquired absence of ovaries, bilateral: Secondary | ICD-10-CM | POA: Insufficient documentation

## 2020-10-19 DIAGNOSIS — Z6828 Body mass index (BMI) 28.0-28.9, adult: Secondary | ICD-10-CM | POA: Diagnosis not present

## 2020-10-19 DIAGNOSIS — Z8543 Personal history of malignant neoplasm of ovary: Secondary | ICD-10-CM | POA: Insufficient documentation

## 2020-10-19 DIAGNOSIS — C569 Malignant neoplasm of unspecified ovary: Secondary | ICD-10-CM

## 2020-10-19 DIAGNOSIS — L309 Dermatitis, unspecified: Secondary | ICD-10-CM | POA: Diagnosis not present

## 2020-10-19 DIAGNOSIS — Z90710 Acquired absence of both cervix and uterus: Secondary | ICD-10-CM | POA: Diagnosis not present

## 2020-10-19 DIAGNOSIS — C50412 Malignant neoplasm of upper-outer quadrant of left female breast: Secondary | ICD-10-CM

## 2020-10-19 DIAGNOSIS — Z1379 Encounter for other screening for genetic and chromosomal anomalies: Secondary | ICD-10-CM

## 2020-10-19 DIAGNOSIS — Z170 Estrogen receptor positive status [ER+]: Secondary | ICD-10-CM

## 2020-10-19 DIAGNOSIS — Z1501 Genetic susceptibility to malignant neoplasm of breast: Secondary | ICD-10-CM

## 2020-10-19 DIAGNOSIS — Z9013 Acquired absence of bilateral breasts and nipples: Secondary | ICD-10-CM | POA: Insufficient documentation

## 2020-10-19 DIAGNOSIS — M8580 Other specified disorders of bone density and structure, unspecified site: Secondary | ICD-10-CM | POA: Diagnosis not present

## 2020-10-19 DIAGNOSIS — Z1509 Genetic susceptibility to other malignant neoplasm: Secondary | ICD-10-CM | POA: Diagnosis not present

## 2020-10-19 DIAGNOSIS — Z9079 Acquired absence of other genital organ(s): Secondary | ICD-10-CM | POA: Insufficient documentation

## 2020-10-19 LAB — CBC WITH DIFFERENTIAL (CANCER CENTER ONLY)
Abs Immature Granulocytes: 0.01 10*3/uL (ref 0.00–0.07)
Basophils Absolute: 0.1 10*3/uL (ref 0.0–0.1)
Basophils Relative: 1 %
Eosinophils Absolute: 0.7 10*3/uL — ABNORMAL HIGH (ref 0.0–0.5)
Eosinophils Relative: 11 %
HCT: 40.9 % (ref 36.0–46.0)
Hemoglobin: 13.4 g/dL (ref 12.0–15.0)
Immature Granulocytes: 0 %
Lymphocytes Relative: 15 %
Lymphs Abs: 1 10*3/uL (ref 0.7–4.0)
MCH: 29.8 pg (ref 26.0–34.0)
MCHC: 32.8 g/dL (ref 30.0–36.0)
MCV: 90.9 fL (ref 80.0–100.0)
Monocytes Absolute: 0.6 10*3/uL (ref 0.1–1.0)
Monocytes Relative: 9 %
Neutro Abs: 4.3 10*3/uL (ref 1.7–7.7)
Neutrophils Relative %: 64 %
Platelet Count: 315 10*3/uL (ref 150–400)
RBC: 4.5 MIL/uL (ref 3.87–5.11)
RDW: 12.9 % (ref 11.5–15.5)
WBC Count: 6.7 10*3/uL (ref 4.0–10.5)
nRBC: 0 % (ref 0.0–0.2)

## 2020-10-19 LAB — CMP (CANCER CENTER ONLY)
ALT: 17 U/L (ref 0–44)
AST: 21 U/L (ref 15–41)
Albumin: 3.9 g/dL (ref 3.5–5.0)
Alkaline Phosphatase: 72 U/L (ref 38–126)
Anion gap: 11 (ref 5–15)
BUN: 17 mg/dL (ref 8–23)
CO2: 23 mmol/L (ref 22–32)
Calcium: 9.6 mg/dL (ref 8.9–10.3)
Chloride: 106 mmol/L (ref 98–111)
Creatinine: 0.85 mg/dL (ref 0.44–1.00)
GFR, Estimated: >60 mL/min (ref >60)
Glucose, Bld: 98 mg/dL (ref 70–99)
Potassium: 3.8 mmol/L (ref 3.5–5.1)
Sodium: 140 mmol/L (ref 135–145)
Total Bilirubin: 0.4 mg/dL (ref 0.3–1.2)
Total Protein: 7 g/dL (ref 6.5–8.1)

## 2020-11-21 ENCOUNTER — Other Ambulatory Visit (HOSPITAL_COMMUNITY): Payer: Self-pay | Admitting: Oncology

## 2020-11-21 ENCOUNTER — Ambulatory Visit (HOSPITAL_COMMUNITY)
Admission: RE | Admit: 2020-11-21 | Discharge: 2020-11-21 | Disposition: A | Discharge status: Home / Self Care | Payer: Medicare Other | Source: Ambulatory Visit | Attending: Oncology | Admitting: Oncology

## 2020-11-21 DIAGNOSIS — Z1509 Genetic susceptibility to other malignant neoplasm: Secondary | ICD-10-CM | POA: Diagnosis not present

## 2020-11-21 DIAGNOSIS — C569 Malignant neoplasm of unspecified ovary: Secondary | ICD-10-CM

## 2020-11-21 DIAGNOSIS — R935 Abnormal findings on diagnostic imaging of other abdominal regions, including retroperitoneum: Secondary | ICD-10-CM | POA: Diagnosis not present

## 2020-11-21 DIAGNOSIS — Z170 Estrogen receptor positive status [ER+]: Secondary | ICD-10-CM | POA: Diagnosis not present

## 2020-11-21 DIAGNOSIS — Z8507 Personal history of malignant neoplasm of pancreas: Secondary | ICD-10-CM | POA: Insufficient documentation

## 2020-11-21 DIAGNOSIS — Z08 Encounter for follow-up examination after completed treatment for malignant neoplasm: Secondary | ICD-10-CM

## 2020-11-21 DIAGNOSIS — K7689 Other specified diseases of liver: Secondary | ICD-10-CM | POA: Diagnosis not present

## 2020-11-21 DIAGNOSIS — Z1379 Encounter for other screening for genetic and chromosomal anomalies: Secondary | ICD-10-CM | POA: Diagnosis not present

## 2020-11-21 DIAGNOSIS — Z1501 Genetic susceptibility to malignant neoplasm of breast: Secondary | ICD-10-CM | POA: Diagnosis not present

## 2020-11-21 DIAGNOSIS — Z1289 Encounter for screening for malignant neoplasm of other sites: Secondary | ICD-10-CM | POA: Diagnosis not present

## 2020-11-21 DIAGNOSIS — C50412 Malignant neoplasm of upper-outer quadrant of left female breast: Secondary | ICD-10-CM | POA: Diagnosis not present

## 2020-11-21 MED ORDER — GADOBUTROL 1 MMOL/ML IV SOLN
8.0000 mL | Freq: Once | INTRAVENOUS | Status: AC | PRN
  Administered 2020-11-21: 8 mL via INTRAVENOUS

## 2020-11-24 ENCOUNTER — Telehealth: Payer: Self-pay

## 2020-11-24 NOTE — Telephone Encounter (Signed)
Called pt to share MRI results show no abnormality of abdomen and specifically no pancreatic cancer, per Mendel Ryder.  Pt verbalized thanks

## 2020-12-01 DIAGNOSIS — Z23 Encounter for immunization: Secondary | ICD-10-CM | POA: Diagnosis not present

## 2020-12-23 DIAGNOSIS — M8580 Other specified disorders of bone density and structure, unspecified site: Secondary | ICD-10-CM | POA: Diagnosis not present

## 2021-09-01 DIAGNOSIS — M8580 Other specified disorders of bone density and structure, unspecified site: Secondary | ICD-10-CM | POA: Diagnosis not present

## 2021-09-13 DIAGNOSIS — M85852 Other specified disorders of bone density and structure, left thigh: Secondary | ICD-10-CM | POA: Diagnosis not present

## 2021-09-13 DIAGNOSIS — M85851 Other specified disorders of bone density and structure, right thigh: Secondary | ICD-10-CM | POA: Diagnosis not present

## 2021-10-18 ENCOUNTER — Other Ambulatory Visit: Payer: Self-pay | Admitting: *Deleted

## 2021-10-18 DIAGNOSIS — C50412 Malignant neoplasm of upper-outer quadrant of left female breast: Secondary | ICD-10-CM

## 2021-10-19 ENCOUNTER — Inpatient Hospital Stay: Payer: No Typology Code available for payment source | Admitting: Hematology and Oncology

## 2021-10-19 ENCOUNTER — Inpatient Hospital Stay: Payer: No Typology Code available for payment source | Attending: Family Medicine

## 2021-10-19 NOTE — Progress Notes (Deleted)
Vicki Hale  [phone] [phone]   ID: Vicki Hale DOB: [birth year]  MR#: [id number]  LNL#:[id number]  Patient Care Team: Leonides Sake, MD as PCP - General (Family Medicine) [name], Virgie Dad, MD (Inactive) as Consulting Physician (Oncology) Rolm Bookbinder, MD as Consulting Physician (General Surgery) Consuella Lose, MD as Consulting Physician (Neurosurgery) Rosalin Hawking, MD as Consulting Physician (Neurology) Bernarda Caffey, MD as Consulting Physician (Ophthalmology) Clent Jacks, MD as Consulting Physician (Ophthalmology) Leandrew Koyanagi, MD as Attending Physician (Orthopedic Surgery) OTHER MD:  CHIEF COMPLAINT: Breast cancer in BRCA carrier (s/p bilateral mastectomies)  CURRENT TREATMENT: Completing 5 years of anastrozole   INTERVAL HISTORY: [name] returns today for follow-up of her estrogen receptor positive breast cancer.  She continues on anastrozole, with good tolerance.  She has never had problems with hot flashes and vaginal dryness is not a significant issue.  She has significant osteopenia however and was started on denosumab/Prolia through Dr. Onnie Boer office last year.  She is tolerating that well.  She is BRCA1 positive.  She has a family history of MGM with pancreatic cancer.  She did not have an MRCP last year.   REVIEW OF SYSTEMS: Amayia is very busy, walking 2 to 3 miles most days.  She has 5 grandchildren in 5 different schools playing multiple sports and this keeps her on her toes.  She and her husband had severe COVID November 2021, before that had any vaccines, and he had a heart attack 3 months later which fortunately he recovered from without significant residuals.  He is now retired and very busy with the grandchildren as well.  She then had COVID a second time September of this year.  This time it was very mild.  Chantay and her family went to [place], were exposed to significant heat and she developed a rash  which is very itchy and troublesome.  A detailed review of systems today was otherwise stable   COVID 19 VACCINATION STATUS: Status post COVID November 2021 and September 2022, status post [place] x3 as of October 2020   CANCER HISTORY: From the original intake note:  Vicki Hale was diagnosed with ovarian cancer in April 2003. She underwent total abdominal hysterectomy and bilateral salpingo-oophorectomy with a full staging operation at [facility] that month and was then treated according to GOG 182, with 4 cycles of carboplatin/topotecan and then 4 cycles of carboplatin/paclitaxel. She was then followed by gynecologic oncology through 2011, and there has been no history of recurrent disease.  On 40/81/4481 Vicki Hale underwent screening mammography at [facility] with tomosynthesis. Breast density was category A. New grouped calcifications were noted in the left breast upper outer quadrant and the patient was recalled for diagnostic unilateral left mammography 01/26/2015. This confirmed a group of new linear calcifications in the upper outer quadrant of the left breast extending over an area of 2.0 cm..   On 01/28/2015 the patient underwent biopsy of her left breast, with the pathology ([id number]) showing an invasive ductal carcinoma, grade 2, estrogen receptor 100% positive, progesterone receptor 50% positive, both with strong staining intensity, with an MIB-1 of 10% and no HER-2 amplification, the signals ratio being 1.21 and the number per cell 2.00.  Her subsequent history is as detailed below   PAST MEDICAL HISTORY:  Past Medical History:  Diagnosis Date   Allergy    seasonal allergies   Arthritis    generalized   Breast cancer ([place]) 2017   [facility] in [place]   Family history of  breast cancer    Family history of pancreatic cancer    FH: BRCA1 gene positive    Osteoporosis    Ovarian cancer ([place]) 2003   Pericarditis, acute    30 YRS AGO    SAH (subarachnoid hemorrhage) ([place])    04/2014   Stroke ([place])     no deficits    PAST SURGICAL HISTORY: Past Surgical History:  Procedure Laterality Date   BREAST RECONSTRUCTION WITH PLACEMENT OF TISSUE EXPANDER AND FLEX HD (ACELLULAR HYDRATED DERMIS) Bilateral 03/18/2015   Procedure: BILATERAL BREAST RECONSTRUCTION WITH PLACEMENT OF TISSUE EXPANDER AND FLEX HD (ACELLULAR HYDRATED DERMIS);  Surgeon: Loel Lofty Dillingham, DO;  Location: [facility];  Service: Plastics;  Laterality: Bilateral;   CESAREAN SECTION     X2   COLONOSCOPY  2016   COLONOSCOPY  11/12/2019   HERNIA REPAIR     LIPOSUCTION WITH LIPOFILLING Bilateral 12/02/2015   Procedure: LIPOSUCTION WITH LIPOFILLING FROM ABDOMEN TO BILATERAL BREAST;  Surgeon: [name], DO;  Location: [facility];  Service: Plastics;  Laterality: Bilateral;   LIPOSUCTION WITH LIPOFILLING Bilateral 03/23/2016   Procedure: BILATERAL FAT GRAFTING;  Surgeon: [name], DO;  Location: [facility];  Service: Plastics;  Laterality: Bilateral;   MASTECTOMY W/ SENTINEL NODE BIOPSY Left 03/18/2015   Procedure: LEFT TOTAL MASTECTOMY WITH LEFT AXILLARY SENTINEL LYMPH NODE BIOPSY;  Surgeon: Excell Seltzer, MD;  Location: [facility];  Service: General;  Laterality: Left;   MASTECTOMY, PARTIAL Right 03/18/2015   Procedure: RIGHT PROPHYLACTIC MASTECTOMY ;  Surgeon: Excell Seltzer, MD;  Location: [facility];  Service: General;  Laterality: Right;   POLYPECTOMY     REMOVAL OF BILATERAL TISSUE EXPANDERS WITH PLACEMENT OF BILATERAL BREAST IMPLANTS Bilateral 08/12/2015   Procedure: REMOVAL OF BILATERAL TISSUE EXPANDERS WITH PLACEMENT OF BILATERAL BREAST IMPLANTS;  Surgeon: [name], DO;  Location: [facility];  Service: Plastics;  Laterality: Bilateral;   TONSILLECTOMY     TOTAL ABDOMINAL HYSTERECTOMY W/ BILATERAL SALPINGOOPHORECTOMY      FAMILY HISTORY Family History  Problem Relation Age of Onset   Colon polyps Mother 62   Breast cancer Daughter 10   BRCA 1/2 Daughter         BRCA1   COPD Maternal Aunt    Esophageal cancer Maternal Uncle 77   Lung cancer Paternal Aunt    Pancreatic cancer Maternal Grandmother        early 44s   Prostate cancer Maternal Grandfather        82s   Skin cancer Paternal Grandmother        untreated BCC   COPD Maternal Aunt    Lung cancer Maternal Aunt    Breast cancer Cousin 77       maternal first cousin   BRCA 1/2 Daughter        BRCA1   Esophageal cancer Cousin 63       maternal first cousin   Bladder Cancer Paternal Aunt    Colon cancer Neg Hx    Rectal cancer Neg Hx    Ulcerative colitis Neg Hx    Stomach cancer Neg Hx    the patient's 2 daughters have been tested for the BRCA gene and both are positive. One of them had breast cancer at the age of 64. In addition on the maternal side there is pancreatic prostate breast and esophageal cancer. The patient's father died at the age of 33 and a car accident and there is very little  information on his side of the family. The patient's mother died at 41 from COPD. She had had a hysterectomy in her 38s. The patient's mother had 5 siblings several from died young. The patient herself had no sisters, 2 brothers.   GYNECOLOGIC HISTORY:  No LMP recorded. Patient has had a hysterectomy.  Menarche age 56, first live birth age 36, the patient is [place] P2. She underwent TAH/BSO in 2003 for her ovarian cancer. She took hormone replacement for approximately 6 months. She was on oral contraceptives for approximately 4 years remotely, with no complications.   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 Her husband Shanon Brow works for a [employer]. Daughter Brihanna Devenport lives in [place] where she works as a school principal. She is 52 years old. Daughter Zaylie Gisler lives in [place] and is a Pharmacist, hospital. She is 70 years old. These ages are as of January 2017. The patient has 5 grandchildren. She attends a local [employer] DIRECTIVES: Not in place   HEALTH MAINTENANCE: Social History   Tobacco Use   Smoking status: Never   Smokeless tobacco: Never  Vaping Use   Vaping Use: Never used  Substance Use Topics   Alcohol use: Not Currently    Alcohol/week: 0.0 standard drinks of alcohol   Drug use: No     Colonoscopy: September 2016/ [facility]  PAP:  Bone density: 2016  Lipid panel:  Allergies  Allergen Reactions   Taxotere [Docetaxel] Rash   Penicillins Itching and Rash    Has patient had a PCN reaction causing immediate rash, facial/tongue/throat swelling, SOB or lightheadedness with hypotension: No Has patient had a PCN reaction causing severe rash involving mucus membranes or skin necrosis: No Has patient had a PCN reaction that required hospitalization No Has patient had a PCN reaction occurring within the last 10 years: No If all of the above answers are "NO", then may proceed with Cephalosporin use.     Current Outpatient Medications  Medication Sig Dispense Refill   anastrozole (ARIMIDEX) 1 MG tablet Take 1 tablet (1 mg total) by mouth daily. 90 tablet 4   Ascorbic Acid (VITAMIN C) 1000 MG tablet Take 1,000 mg by mouth daily.      B Complex Vitamins (B COMPLEX PO) Take 2 tablets by mouth daily.     Denosumab (PROLIA [place]) Inject into the skin.     loratadine (CLARITIN) 10 MG tablet Take 10 mg by mouth daily.     Multiple Vitamin (MULTIVITAMIN WITH MINERALS) TABS tablet Take 2 tablets by mouth daily.     Turmeric (QC TUMERIC COMPLEX PO) Take by mouth.     Vitamin D, Cholecalciferol, 1000 units CAPS Take by mouth.     No current facility-administered medications for this visit.    OBJECTIVE: White woman who appears younger than stated age  There were no vitals filed for this visit.    There is no height or weight on file to calculate BMI.    ECOG FS:1 - Symptomatic but completely ambulatory  Sclerae unicteric, EOMs intact Wearing a mask No cervical or  supraclavicular adenopathy Lungs no rales or rhonchi Heart regular rate and rhythm Abd soft, nontender, positive bowel sounds MSK no focal spinal tenderness, no upper extremity lymphedema Neuro: nonfocal, well oriented, appropriate affect Breasts: Status post bilateral mastectomies with bilateral reconstruction.  There is no evidence of chest wall recurrence.  Both axillae are benign Skin: She has a papular erythematous rash on the back and abdomen, but not over the breasts.  This is itchy.  It is not vesicular.  It is not confluent.  LAB RESULTS:  CMP     Component Value Date/Time   NA 140 10/19/2020 0942   NA 142 07/15/2016 0828   K 3.8 10/19/2020 0942   K 4.2 07/15/2016 0828   CL 106 10/19/2020 0942   CO2 23 10/19/2020 0942   CO2 25 07/15/2016 0828   GLUCOSE 98 10/19/2020 0942   GLUCOSE 94 07/15/2016 0828   BUN 17 10/19/2020 0942   BUN 14.8 07/15/2016 0828   CREATININE 0.85 10/19/2020 0942   CREATININE 0.8 07/15/2016 0828   CALCIUM 9.6 10/19/2020 0942   CALCIUM 9.9 07/15/2016 0828   PROT 7.0 10/19/2020 0942   PROT 6.6 07/15/2016 0828   ALBUMIN 3.9 10/19/2020 0942   ALBUMIN 3.8 07/15/2016 0828   AST 21 10/19/2020 0942   AST 22 07/15/2016 0828   ALT 17 10/19/2020 0942   ALT 19 07/15/2016 0828   ALKPHOS 72 10/19/2020 0942   ALKPHOS 103 07/15/2016 0828   BILITOT 0.4 10/19/2020 0942   BILITOT 0.39 07/15/2016 0828   GFRNONAA >60 10/19/2020 0942   GFRAA >60 08/12/2019 1127    INo results found for: "SPEP", "UPEP"  Lab Results  Component Value Date   WBC 6.7 10/19/2020   NEUTROABS 4.3 10/19/2020   HGB 13.4 10/19/2020   HCT 40.9 10/19/2020   MCV 90.9 10/19/2020   PLT 315 10/19/2020      Chemistry      Component Value Date/Time   NA 140 10/19/2020 0942   NA 142 07/15/2016 0828   K 3.8 10/19/2020 0942   K 4.2 07/15/2016 0828   CL 106 10/19/2020 0942   CO2 23 10/19/2020 0942   CO2 25 07/15/2016 0828   BUN 17 10/19/2020 0942   BUN 14.8 07/15/2016 0828    CREATININE 0.85 10/19/2020 0942   CREATININE 0.8 07/15/2016 0828      Component Value Date/Time   CALCIUM 9.6 10/19/2020 0942   CALCIUM 9.9 07/15/2016 0828   ALKPHOS 72 10/19/2020 0942   ALKPHOS 103 07/15/2016 0828   AST 21 10/19/2020 0942   AST 22 07/15/2016 0828   ALT 17 10/19/2020 0942   ALT 19 07/15/2016 0828   BILITOT 0.4 10/19/2020 0942   BILITOT 0.39 07/15/2016 0828      No results found for: "LABCA2"  No components found for: "LABCA125"  No results for input(s): "INR" in the last 168 hours.  Urinalysis No results found for: "COLORURINE", "APPEARANCEUR", "LABSPEC", "PHURINE", "GLUCOSEU", "HGBUR", "BILIRUBINUR", "KETONESUR", "PROTEINUR", "UROBILINOGEN", "NITRITE", "LEUKOCYTESUR"   STUDIES: No results found.   ASSESSMENT: 71 y.o. BRCA1 positive [place] woman  (1) ovarian cancer stage IIIB diagnosed April 2003  (a) s/p supracervical hysterectomy and BSO with surgical staging April 2003  (b) treated according to GOG 182: carboplatin/ topotecan x4 followed by carboplatin/taxol x4  (c) last chemotherapy dose October 2003  (2) status post left breast upper outer quadrant biopsy 01/28/2015 for an invasive ductal carcinoma, grade 2, estrogen and progesterone receptor positive, HER-2 not amplified, with an MIB-1 of 10%  (a) tamoxifen started neoadjuvantly due to concerns regarding surgical delay, interrupted with chemotherapy  (3) status post bilateral mastectomies with left axillary lymph node sampling 03/18/2015 showing  (a) on the right, benign disease  (b) on the left, a pT1c pN0, stage IA invasive ductal carcinoma, repeat HER-2  again negative, stage IA    (4) Oncotype DX score of 29, in the high intermediate range, predicts a risk of recurrence with tamoxifen alone of 19%. The addition of chemotherapy in this setting is predicted to decrease distant disease-free recurrence by approximately 6%.   (5) adjuvant chemotherapy with cyclophosphamide and docetaxel started  04/22/2015, repeated every 21 days 4  (a) gemcitabine switched for docetaxel for cycles 3 and 4 due to rash.  (b) chemotherapy completed 06/24/2015  (6) adjuvant anastrozole started 07/29/2015, completing 5+ years October 2022  (a) bone density at [facility] 06/09/2014 shows a T score of -2.4; this is increased from prior  (b) bone density at [facility] 07/08/2016 shows a T score of -2.3, which is stable  (c) denosumab/Prolia started through Dr. Onnie Boer office 2021 (d) bone density at [facility] 07/30/2019 shows a T score of -2.2, stable  (7) pancreatic cancer screening  (a) status post MRCP 11/12/2018 showing no evidence of pancreatic cancer  (b) repeat MRCP November 2022  PLAN:  Annual MRCP for pancreatic cancer screening S/P Bilateral mastectomies, no concern for recurrence.   *Total Encounter Time as defined by the Centers for Medicare and Medicaid Services includes, in addition to the face-to-face time of a patient visit (documented in the note above) non-face-to-face time: obtaining and reviewing outside history, ordering and reviewing medications, tests or procedures, care coordination (communications with other health care professionals or caregivers) and documentation in the medical record.

## 2021-11-10 DIAGNOSIS — R21 Rash and other nonspecific skin eruption: Secondary | ICD-10-CM | POA: Diagnosis not present

## 2021-11-10 DIAGNOSIS — R519 Headache, unspecified: Secondary | ICD-10-CM | POA: Diagnosis not present

## 2021-11-12 DIAGNOSIS — R21 Rash and other nonspecific skin eruption: Secondary | ICD-10-CM | POA: Diagnosis not present

## 2021-11-12 DIAGNOSIS — B029 Zoster without complications: Secondary | ICD-10-CM | POA: Diagnosis not present

## 2022-02-21 DIAGNOSIS — Z79899 Other long term (current) drug therapy: Secondary | ICD-10-CM | POA: Diagnosis not present

## 2022-03-04 DIAGNOSIS — M8580 Other specified disorders of bone density and structure, unspecified site: Secondary | ICD-10-CM | POA: Diagnosis not present

## 2022-03-10 ENCOUNTER — Inpatient Hospital Stay: Payer: Medicare PPO | Admitting: Hematology and Oncology

## 2022-03-10 ENCOUNTER — Inpatient Hospital Stay: Payer: No Typology Code available for payment source

## 2022-03-16 DIAGNOSIS — Z0000 Encounter for general adult medical examination without abnormal findings: Secondary | ICD-10-CM | POA: Diagnosis not present

## 2022-03-16 DIAGNOSIS — E7800 Pure hypercholesterolemia, unspecified: Secondary | ICD-10-CM | POA: Diagnosis not present

## 2022-03-16 DIAGNOSIS — Z79899 Other long term (current) drug therapy: Secondary | ICD-10-CM | POA: Diagnosis not present

## 2022-03-23 ENCOUNTER — Inpatient Hospital Stay: Payer: Medicare PPO | Admitting: Hematology and Oncology

## 2022-03-23 ENCOUNTER — Encounter: Payer: Self-pay | Admitting: Hematology and Oncology

## 2022-03-23 ENCOUNTER — Inpatient Hospital Stay: Payer: Medicare PPO | Attending: Hematology and Oncology

## 2022-03-23 ENCOUNTER — Other Ambulatory Visit: Payer: Self-pay

## 2022-03-23 VITALS — BP 155/67 | HR 59 | Temp 97.4°F | Resp 18 | Ht 67.0 in | Wt 181.3 lb

## 2022-03-23 DIAGNOSIS — Z1501 Genetic susceptibility to malignant neoplasm of breast: Secondary | ICD-10-CM | POA: Diagnosis not present

## 2022-03-23 DIAGNOSIS — Z90710 Acquired absence of both cervix and uterus: Secondary | ICD-10-CM | POA: Insufficient documentation

## 2022-03-23 DIAGNOSIS — Z801 Family history of malignant neoplasm of trachea, bronchus and lung: Secondary | ICD-10-CM | POA: Insufficient documentation

## 2022-03-23 DIAGNOSIS — Z1509 Genetic susceptibility to other malignant neoplasm: Secondary | ICD-10-CM | POA: Insufficient documentation

## 2022-03-23 DIAGNOSIS — Z79811 Long term (current) use of aromatase inhibitors: Secondary | ICD-10-CM | POA: Insufficient documentation

## 2022-03-23 DIAGNOSIS — C50412 Malignant neoplasm of upper-outer quadrant of left female breast: Secondary | ICD-10-CM | POA: Diagnosis not present

## 2022-03-23 DIAGNOSIS — Z90722 Acquired absence of ovaries, bilateral: Secondary | ICD-10-CM | POA: Diagnosis not present

## 2022-03-23 DIAGNOSIS — Z8543 Personal history of malignant neoplasm of ovary: Secondary | ICD-10-CM | POA: Insufficient documentation

## 2022-03-23 DIAGNOSIS — Z1502 Genetic susceptibility to malignant neoplasm of ovary: Secondary | ICD-10-CM | POA: Insufficient documentation

## 2022-03-23 DIAGNOSIS — Z9013 Acquired absence of bilateral breasts and nipples: Secondary | ICD-10-CM | POA: Diagnosis not present

## 2022-03-23 DIAGNOSIS — Z800 Family history of malignant neoplasm of digestive organs: Secondary | ICD-10-CM | POA: Insufficient documentation

## 2022-03-23 DIAGNOSIS — Z170 Estrogen receptor positive status [ER+]: Secondary | ICD-10-CM | POA: Diagnosis not present

## 2022-03-23 DIAGNOSIS — Z803 Family history of malignant neoplasm of breast: Secondary | ICD-10-CM | POA: Insufficient documentation

## 2022-03-23 LAB — CMP (CANCER CENTER ONLY)
ALT: 17 U/L (ref 0–44)
AST: 21 U/L (ref 15–41)
Albumin: 4.2 g/dL (ref 3.5–5.0)
Alkaline Phosphatase: 80 U/L (ref 38–126)
Anion gap: 6 (ref 5–15)
BUN: 14 mg/dL (ref 8–23)
CO2: 28 mmol/L (ref 22–32)
Calcium: 9.7 mg/dL (ref 8.9–10.3)
Chloride: 106 mmol/L (ref 98–111)
Creatinine: 0.78 mg/dL (ref 0.44–1.00)
GFR, Estimated: >60 mL/min (ref >60)
Glucose, Bld: 90 mg/dL (ref 70–99)
Potassium: 4.2 mmol/L (ref 3.5–5.1)
Sodium: 140 mmol/L (ref 135–145)
Total Bilirubin: 0.4 mg/dL (ref 0.3–1.2)
Total Protein: 7.4 g/dL (ref 6.5–8.1)

## 2022-03-23 LAB — CBC WITH DIFFERENTIAL (CANCER CENTER ONLY)
Abs Immature Granulocytes: 0.02 10*3/uL (ref 0.00–0.07)
Basophils Absolute: 0.1 10*3/uL (ref 0.0–0.1)
Basophils Relative: 1 %
Eosinophils Absolute: 0.6 10*3/uL — ABNORMAL HIGH (ref 0.0–0.5)
Eosinophils Relative: 8 %
HCT: 39 % (ref 36.0–46.0)
Hemoglobin: 13.1 g/dL (ref 12.0–15.0)
Immature Granulocytes: 0 %
Lymphocytes Relative: 16 %
Lymphs Abs: 1.3 10*3/uL (ref 0.7–4.0)
MCH: 29.8 pg (ref 26.0–34.0)
MCHC: 33.6 g/dL (ref 30.0–36.0)
MCV: 88.8 fL (ref 80.0–100.0)
Monocytes Absolute: 0.7 10*3/uL (ref 0.1–1.0)
Monocytes Relative: 8 %
Neutro Abs: 5.2 10*3/uL (ref 1.7–7.7)
Neutrophils Relative %: 67 %
Platelet Count: 313 10*3/uL (ref 150–400)
RBC: 4.39 MIL/uL (ref 3.87–5.11)
RDW: 12.9 % (ref 11.5–15.5)
WBC Count: 7.8 10*3/uL (ref 4.0–10.5)
nRBC: 0 % (ref 0.0–0.2)

## 2022-03-23 NOTE — Progress Notes (Signed)
[facility]  [phone] [phone]   ID: [name] DOB: [id number]  MR#: [id number]  [id number]  Patient Care Team: Leonides Sake, MD as PCP - General (Family Medicine) [name], Virgie Dad, MD (Inactive) as Consulting Physician (Oncology) Rolm Bookbinder, MD as Consulting Physician (General Surgery) Consuella Lose, MD as Consulting Physician (Neurosurgery) Rosalin Hawking, MD as Consulting Physician (Neurology) Bernarda Caffey, MD as Consulting Physician (Ophthalmology) Clent Jacks, MD as Consulting Physician (Ophthalmology) Leandrew Koyanagi, MD as Attending Physician (Orthopedic Surgery)   CHIEF COMPLAINT: Breast cancer in BRCA carrier (s/p bilateral mastectomies)  CURRENT TREATMENT: Surveillance.  INTERVAL HISTORY:  Seretha returns today for follow-up of her estrogen receptor positive breast cancer. She is BRCA1 positive.  She has a family history of MGM with pancreatic cancer.  She did not have an MRCP last year. She denies any new abdominal complaints. No new breast changes in post mastectomy scars. She is now on monteleukast and hydroxyzine for allergies. She otherwise feels well, walking regularly 2/3 miles a day.  She is keeping herself very busy with that taking her grandkids around who plays several different sports in several different schools.   COVID 19 VACCINATION STATUS: Status post COVID November 2021 and September 2022, status post [employer] x3 as of October 2020   CANCER HISTORY: From the original intake note:  Saarah was diagnosed with ovarian cancer in April 2003. She underwent total abdominal hysterectomy and bilateral salpingo-oophorectomy with a full staging operation at [facility] that month and was then treated according to GOG 182, with 4 cycles of carboplatin/topotecan and then 4 cycles of carboplatin/paclitaxel. She was then followed by gynecologic oncology through 2011, and there has been no history of recurrent  disease.  On 0000000 Rosalina underwent screening mammography at [facility] with tomosynthesis. Breast density was category A. New grouped calcifications were noted in the left breast upper outer quadrant and the patient was recalled for diagnostic unilateral left mammography 01/26/2015. This confirmed a group of new linear calcifications in the upper outer quadrant of the left breast extending over an area of 2.0 cm..   On 01/28/2015 the patient underwent biopsy of her left breast, with the pathology ([id number]) showing an invasive ductal carcinoma, grade 2, estrogen receptor 100% positive, progesterone receptor 50% positive, both with strong staining intensity, with an MIB-1 of 10% and no HER-2 amplification, the signals ratio being 1.21 and the number per cell 2.00.  Her subsequent history is as detailed below PAST MEDICAL HISTORY:  Past Medical History:  Diagnosis Date   Allergy    seasonal allergies   Arthritis    generalized   Breast cancer ([place]) 2017   [facility] in [place]   Family history of breast cancer    Family history of pancreatic cancer    FH: BRCA1 gene positive    Osteoporosis    Ovarian cancer ([place]) 2003   Pericarditis, acute    30 YRS AGO    SAH (subarachnoid hemorrhage) ([place])    04/2014   Stroke ([place])    no deficits    PAST SURGICAL HISTORY: Past Surgical History:  Procedure Laterality Date   BREAST RECONSTRUCTION WITH PLACEMENT OF TISSUE EXPANDER AND FLEX HD (ACELLULAR HYDRATED DERMIS) Bilateral 03/18/2015   Procedure: BILATERAL BREAST RECONSTRUCTION WITH PLACEMENT OF TISSUE EXPANDER AND FLEX HD (ACELLULAR HYDRATED DERMIS);  Surgeon: Loel Lofty Dillingham, DO;  Location: [facility];  Service: Plastics;  Laterality: Bilateral;   CESAREAN SECTION     X2   COLONOSCOPY  2016   COLONOSCOPY  11/12/2019   HERNIA REPAIR     LIPOSUCTION WITH LIPOFILLING Bilateral 12/02/2015   Procedure: LIPOSUCTION WITH LIPOFILLING FROM ABDOMEN TO BILATERAL BREAST;  Surgeon: [name], DO;   Location: [facility];  Service: Plastics;  Laterality: Bilateral;   LIPOSUCTION WITH LIPOFILLING Bilateral 03/23/2016   Procedure: BILATERAL FAT GRAFTING;  Surgeon: [name], DO;  Location: [facility];  Service: Plastics;  Laterality: Bilateral;   MASTECTOMY W/ SENTINEL NODE BIOPSY Left 03/18/2015   Procedure: LEFT TOTAL MASTECTOMY WITH LEFT AXILLARY SENTINEL LYMPH NODE BIOPSY;  Surgeon: Excell Seltzer, MD;  Location: [facility];  Service: General;  Laterality: Left;   MASTECTOMY, PARTIAL Right 03/18/2015   Procedure: RIGHT PROPHYLACTIC MASTECTOMY ;  Surgeon: Excell Seltzer, MD;  Location: [facility];  Service: General;  Laterality: Right;   POLYPECTOMY     REMOVAL OF BILATERAL TISSUE EXPANDERS WITH PLACEMENT OF BILATERAL BREAST IMPLANTS Bilateral 08/12/2015   Procedure: REMOVAL OF BILATERAL TISSUE EXPANDERS WITH PLACEMENT OF BILATERAL BREAST IMPLANTS;  Surgeon: [name], DO;  Location: [facility];  Service: Plastics;  Laterality: Bilateral;   TONSILLECTOMY     TOTAL ABDOMINAL HYSTERECTOMY W/ BILATERAL SALPINGOOPHORECTOMY      FAMILY HISTORY Family History  Problem Relation Age of Onset   Colon polyps Mother 60   Breast cancer Daughter 76   BRCA 1/2 Daughter        BRCA1   COPD Maternal Aunt    Esophageal cancer Maternal Uncle 81   Lung cancer Paternal Aunt    Pancreatic cancer Maternal Grandmother        early 72s   Prostate cancer Maternal Grandfather        31s   Skin cancer Paternal Grandmother        untreated BCC   COPD Maternal Aunt    Lung cancer Maternal Aunt    Breast cancer Cousin 56       maternal first cousin   BRCA 1/2 Daughter        BRCA1   Esophageal cancer Cousin 86       maternal first cousin   Bladder Cancer Paternal Aunt    Colon cancer Neg Hx    Rectal cancer Neg Hx    Ulcerative colitis Neg Hx    Stomach cancer Neg Hx    the patient's 2 daughters have been tested for the BRCA gene and both are  positive. One of them had breast cancer at the age of 76. In addition on the maternal side there is pancreatic prostate breast and esophageal cancer. The patient's father died at the age of 43 and a car accident and there is very little information on his side of the family. The patient's mother died at 86 from COPD. She had had a hysterectomy in her 23s. The patient's mother had 5 siblings several from died young. The patient herself had no sisters, 2 brothers.   GYNECOLOGIC HISTORY:  No LMP recorded. Patient has had a hysterectomy.  Menarche age 38, first live birth age 46, the patient is [place] P2. She underwent TAH/BSO in 2003 for her ovarian cancer. She took hormone replacement for approximately 6 months. She was on oral contraceptives for approximately 4 years remotely, with no complications.   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 Her husband Shanon Brow works for a [employer]. Daughter Anesha Westman lives  in [place] where she works as a Programmer, multimedia. She is 25 years old. Daughter Izzabell Ludolph lives in [place] and is a Pharmacist, hospital. She is 71 years old. These ages are as of January 2017. The patient has 5 grandchildren. She attends a local [employer] DIRECTIVES: Not in place   HEALTH MAINTENANCE: Social History   Tobacco Use   Smoking status: Never   Smokeless tobacco: Never  Vaping Use   Vaping Use: Never used  Substance Use Topics   Alcohol use: Not Currently    Alcohol/week: 0.0 standard drinks of alcohol   Drug use: No     Colonoscopy: September 2016/ [facility]  PAP:  Bone density: 2016  Lipid panel:  Allergies  Allergen Reactions   Taxotere [Docetaxel] Rash   Penicillins Itching and Rash    Has patient had a PCN reaction causing immediate rash, facial/tongue/throat swelling, SOB or lightheadedness with hypotension: No Has patient had a PCN reaction causing severe rash involving  mucus membranes or skin necrosis: No Has patient had a PCN reaction that required hospitalization No Has patient had a PCN reaction occurring within the last 10 years: No If all of the above answers are "NO", then may proceed with Cephalosporin use.     Current Outpatient Medications  Medication Sig Dispense Refill   anastrozole (ARIMIDEX) 1 MG tablet Take 1 tablet (1 mg total) by mouth daily. 90 tablet 4   Ascorbic Acid (VITAMIN C) 1000 MG tablet Take 1,000 mg by mouth daily.      B Complex Vitamins (B COMPLEX PO) Take 2 tablets by mouth daily.     Denosumab (PROLIA [place]) Inject into the skin.     loratadine (CLARITIN) 10 MG tablet Take 10 mg by mouth daily.     Multiple Vitamin (MULTIVITAMIN WITH MINERALS) TABS tablet Take 2 tablets by mouth daily.     Turmeric (QC TUMERIC COMPLEX PO) Take by mouth.     Vitamin D, Cholecalciferol, 1000 units CAPS Take by mouth.     No current facility-administered medications for this visit.    OBJECTIVE: White woman who appears younger than stated age  79:   03/23/22 1344  BP: (!) 155/67  Pulse: (!) 59  Resp: 18  Temp: (!) 97.4 F (36.3 C)  SpO2: 98%     Body mass index is 28.4 kg/m.    ECOG FS:1 - Symptomatic but completely ambulatory  Sclerae unicteric, EOMs intact Wearing a mask No cervical or supraclavicular adenopathy Lungs no rales or rhonchi Heart regular rate and rhythm Abd soft, nontender, positive bowel sounds MSK no focal spinal tenderness, no upper extremity lymphedema Neuro: nonfocal, well oriented, appropriate affect Breasts: Status post bilateral mastectomies with bilateral reconstruction.  There is no evidence of chest wall recurrence.  Both axillae are benign Skin: No rashes,   LAB RESULTS:  CMP     Component Value Date/Time   NA 140 03/23/2022 1311   NA 142 07/15/2016 0828   K 4.2 03/23/2022 1311   K 4.2 07/15/2016 0828   CL 106 03/23/2022 1311   CO2 28 03/23/2022 1311   CO2 25 07/15/2016 0828   GLUCOSE  90 03/23/2022 1311   GLUCOSE 94 07/15/2016 0828   BUN 14 03/23/2022 1311   BUN 14.8 07/15/2016 0828   CREATININE 0.78 03/23/2022 1311   CREATININE 0.8 07/15/2016 0828   CALCIUM 9.7 03/23/2022 1311   CALCIUM 9.9 07/15/2016 0828   PROT 7.4 03/23/2022 1311   PROT 6.6  07/15/2016 0828   ALBUMIN 4.2 03/23/2022 1311   ALBUMIN 3.8 07/15/2016 0828   AST 21 03/23/2022 1311   AST 22 07/15/2016 0828   ALT 17 03/23/2022 1311   ALT 19 07/15/2016 0828   ALKPHOS 80 03/23/2022 1311   ALKPHOS 103 07/15/2016 0828   BILITOT 0.4 03/23/2022 1311   BILITOT 0.39 07/15/2016 0828   GFRNONAA >60 03/23/2022 1311   GFRAA >60 08/12/2019 1127    INo results found for: "SPEP", "UPEP"  Lab Results  Component Value Date   WBC 7.8 03/23/2022   NEUTROABS 5.2 03/23/2022   HGB 13.1 03/23/2022   HCT 39.0 03/23/2022   MCV 88.8 03/23/2022   PLT 313 03/23/2022      Chemistry      Component Value Date/Time   NA 140 03/23/2022 1311   NA 142 07/15/2016 0828   K 4.2 03/23/2022 1311   K 4.2 07/15/2016 0828   CL 106 03/23/2022 1311   CO2 28 03/23/2022 1311   CO2 25 07/15/2016 0828   BUN 14 03/23/2022 1311   BUN 14.8 07/15/2016 0828   CREATININE 0.78 03/23/2022 1311   CREATININE 0.8 07/15/2016 0828      Component Value Date/Time   CALCIUM 9.7 03/23/2022 1311   CALCIUM 9.9 07/15/2016 0828   ALKPHOS 80 03/23/2022 1311   ALKPHOS 103 07/15/2016 0828   AST 21 03/23/2022 1311   AST 22 07/15/2016 0828   ALT 17 03/23/2022 1311   ALT 19 07/15/2016 0828   BILITOT 0.4 03/23/2022 1311   BILITOT 0.39 07/15/2016 0828      No results found for: "LABCA2"  No components found for: "LABCA125"  No results for input(s): "INR" in the last 168 hours.  Urinalysis No results found for: "COLORURINE", "APPEARANCEUR", "LABSPEC", "PHURINE", "GLUCOSEU", "HGBUR", "BILIRUBINUR", "KETONESUR", "PROTEINUR", "UROBILINOGEN", "NITRITE", "LEUKOCYTESUR"   STUDIES: No results found.   ASSESSMENT: 72 y.o. BRCA1 positive  [place] woman  (1) ovarian cancer stage IIIB diagnosed April 2003  (a) s/p supracervical hysterectomy and BSO with surgical staging April 2003  (b) treated according to GOG 182: carboplatin/ topotecan x4 followed by carboplatin/taxol x4  (c) last chemotherapy dose October 2003  (2) status post left breast upper outer quadrant biopsy 01/28/2015 for an invasive ductal carcinoma, grade 2, estrogen and progesterone receptor positive, HER-2 not amplified, with an MIB-1 of 10%  (a) tamoxifen started neoadjuvantly due to concerns regarding surgical delay, interrupted with chemotherapy  (3) status post bilateral mastectomies with left axillary lymph node sampling 03/18/2015 showing  (a) on the right, benign disease  (b) on the left, a pT1c pN0, stage IA invasive ductal carcinoma, repeat HER-2 again negative, stage IA    (4) Oncotype DX score of 29, in the high intermediate range, predicts a risk of recurrence with tamoxifen alone of 19%. The addition of chemotherapy in this setting is predicted to decrease distant disease-free recurrence by approximately 6%.   (5) adjuvant chemotherapy with cyclophosphamide and docetaxel started 04/22/2015, repeated every 21 days 4  (a) gemcitabine switched for docetaxel for cycles 3 and 4 due to rash.  (b) chemotherapy completed 06/24/2015  (6) adjuvant anastrozole started 07/29/2015, completing 5+ years October 2022  (a) bone density at [facility] 06/09/2014 shows a T score of -2.4; this is increased from prior  (b) bone density at [facility] 07/08/2016 shows a T score of -2.3, which is stable  (c) denosumab/Prolia started through Dr. Onnie Boer office 2021 (d) bone density at [facility] 07/30/2019 shows a T score of -2.2, stable  (  7) pancreatic cancer screening  (a) status post MRCP 11/12/2018 showing no evidence of pancreatic cancer  (b) repeat MRCP November 2022  PLAN: She is here for follow up.  No concerns for breast cancer recurrence.  Bilateral breasts status  postmastectomy. She also had ovarian cancer and had total hysterectomy.  She does have some family history of pancreatic cancer hence underwent a pancreatic cancer screening back in 2022.  No symptoms concerning for pancreatic cancer nor physical examination findings.  I have ordered an MRI abdomen for pancreatic cancer screening.  She was clearly instructed to call us if she does not hear back from radiology and she expressed understanding.  She will reach out back to [place] in 1 year or sooner as needed for follow-up.  *Total Encounter Time as defined by the Centers for Medicare and Medicaid Services includes, in addition to the face-to-face time of a patient visit (documented in the note above) non-face-to-face time: obtaining and reviewing outside history, ordering and reviewing medications, tests or procedures, care coordination (communications with other health care professionals or caregivers) and documentation in the medical record.

## 2022-04-13 ENCOUNTER — Ambulatory Visit (HOSPITAL_COMMUNITY): Admission: RE | Admit: 2022-04-13 | Payer: Medicare PPO | Source: Ambulatory Visit

## 2022-04-13 ENCOUNTER — Other Ambulatory Visit: Payer: Self-pay | Admitting: Hematology and Oncology

## 2022-04-13 DIAGNOSIS — Z170 Estrogen receptor positive status [ER+]: Secondary | ICD-10-CM

## 2022-05-03 ENCOUNTER — Ambulatory Visit (HOSPITAL_COMMUNITY)
Admission: RE | Admit: 2022-05-03 | Discharge: 2022-05-03 | Disposition: A | Discharge status: Home / Self Care | Payer: Medicare PPO | Source: Ambulatory Visit | Attending: Hematology and Oncology | Admitting: Hematology and Oncology

## 2022-05-03 ENCOUNTER — Encounter: Payer: Self-pay | Admitting: Hematology and Oncology

## 2022-05-03 DIAGNOSIS — R935 Abnormal findings on diagnostic imaging of other abdominal regions, including retroperitoneum: Secondary | ICD-10-CM | POA: Diagnosis not present

## 2022-05-03 DIAGNOSIS — Z170 Estrogen receptor positive status [ER+]: Secondary | ICD-10-CM

## 2022-05-03 DIAGNOSIS — R109 Unspecified abdominal pain: Secondary | ICD-10-CM | POA: Diagnosis not present

## 2022-05-03 DIAGNOSIS — C50412 Malignant neoplasm of upper-outer quadrant of left female breast: Secondary | ICD-10-CM | POA: Insufficient documentation

## 2022-05-03 MED ORDER — GADOBUTROL 1 MMOL/ML IV SOLN
8.0000 mL | Freq: Once | INTRAVENOUS | Status: AC | PRN
  Administered 2022-05-03: 8 mL via INTRAVENOUS

## 2022-08-30 DIAGNOSIS — Z79899 Other long term (current) drug therapy: Secondary | ICD-10-CM | POA: Diagnosis not present

## 2022-11-21 DIAGNOSIS — R21 Rash and other nonspecific skin eruption: Secondary | ICD-10-CM | POA: Diagnosis not present

## 2022-11-29 DIAGNOSIS — M8580 Other specified disorders of bone density and structure, unspecified site: Secondary | ICD-10-CM | POA: Diagnosis not present

## 2022-12-08 ENCOUNTER — Encounter: Payer: Self-pay | Admitting: Internal Medicine

## 2023-03-10 DIAGNOSIS — L259 Unspecified contact dermatitis, unspecified cause: Secondary | ICD-10-CM | POA: Diagnosis not present

## 2023-03-10 DIAGNOSIS — L03116 Cellulitis of left lower limb: Secondary | ICD-10-CM | POA: Diagnosis not present

## 2023-03-10 DIAGNOSIS — R21 Rash and other nonspecific skin eruption: Secondary | ICD-10-CM | POA: Diagnosis not present

## 2023-03-10 DIAGNOSIS — L299 Pruritus, unspecified: Secondary | ICD-10-CM | POA: Diagnosis not present

## 2023-03-27 ENCOUNTER — Inpatient Hospital Stay: Payer: Medicare PPO | Admitting: Hematology and Oncology

## 2023-04-11 ENCOUNTER — Telehealth: Payer: Self-pay

## 2023-04-11 NOTE — Telephone Encounter (Signed)
 Left message on voicemail about appointment on 04/12/2023

## 2023-04-12 ENCOUNTER — Inpatient Hospital Stay: Attending: Hematology and Oncology | Admitting: Hematology and Oncology

## 2023-04-12 VITALS — BP 157/64 | HR 70 | Temp 97.6°F | Resp 16 | Wt 177.9 lb

## 2023-04-12 DIAGNOSIS — Z1501 Genetic susceptibility to malignant neoplasm of breast: Secondary | ICD-10-CM | POA: Diagnosis not present

## 2023-04-12 DIAGNOSIS — Z1721 Progesterone receptor positive status: Secondary | ICD-10-CM | POA: Diagnosis not present

## 2023-04-12 DIAGNOSIS — C50412 Malignant neoplasm of upper-outer quadrant of left female breast: Secondary | ICD-10-CM | POA: Insufficient documentation

## 2023-04-12 DIAGNOSIS — Z803 Family history of malignant neoplasm of breast: Secondary | ICD-10-CM | POA: Insufficient documentation

## 2023-04-12 DIAGNOSIS — Z79811 Long term (current) use of aromatase inhibitors: Secondary | ICD-10-CM | POA: Diagnosis not present

## 2023-04-12 DIAGNOSIS — Z9013 Acquired absence of bilateral breasts and nipples: Secondary | ICD-10-CM | POA: Insufficient documentation

## 2023-04-12 DIAGNOSIS — Z170 Estrogen receptor positive status [ER+]: Secondary | ICD-10-CM | POA: Insufficient documentation

## 2023-04-12 DIAGNOSIS — Z90710 Acquired absence of both cervix and uterus: Secondary | ICD-10-CM | POA: Diagnosis not present

## 2023-04-12 DIAGNOSIS — Z1732 Human epidermal growth factor receptor 2 negative status: Secondary | ICD-10-CM | POA: Diagnosis not present

## 2023-04-12 DIAGNOSIS — Z800 Family history of malignant neoplasm of digestive organs: Secondary | ICD-10-CM | POA: Insufficient documentation

## 2023-04-12 DIAGNOSIS — Z1509 Genetic susceptibility to other malignant neoplasm: Secondary | ICD-10-CM | POA: Insufficient documentation

## 2023-04-12 DIAGNOSIS — Z90722 Acquired absence of ovaries, bilateral: Secondary | ICD-10-CM | POA: Diagnosis not present

## 2023-04-12 DIAGNOSIS — Z1502 Genetic susceptibility to malignant neoplasm of ovary: Secondary | ICD-10-CM | POA: Insufficient documentation

## 2023-04-12 NOTE — Progress Notes (Signed)
 [facility]  [phone] [phone]   ID: Vicki Hale DOB: [birth year]  MR#: [id number]  GEX#:[id number]  Patient Care Team: Ailene Ravel, MD as PCP - General (Family Medicine) [name], Valentino Hue, MD (Inactive) as Consulting Physician (Oncology) Emelia Loron, MD as Consulting Physician (General Surgery) Lisbeth Renshaw, MD as Consulting Physician (Neurosurgery) Marvel Plan, MD as Consulting Physician (Neurology) Rennis Chris, MD as Consulting Physician (Ophthalmology) Ernesto Rutherford, MD as Consulting Physician (Ophthalmology) Tarry Kos, MD as Attending Physician (Orthopedic Surgery)   CHIEF COMPLAINT: Breast cancer in BRCA carrier (s/p bilateral mastectomies)  CURRENT TREATMENT: Surveillance.  INTERVAL HISTORY:  Vicki Hale is a 73 year old female with a BRCA1 mutation who presents for follow-up regarding pancreatic cancer screening and ongoing rash issues. She had bilateral mastectomy and BSO. She hasn't noticed any changes in her breast.  She is undergoing follow-up for pancreatic cancer screening due to her BRCA1 mutation. Her last MRI and screening were conducted last year and returned normal results. She has not experienced changes in bowel habits, unintentional weight loss, new onset diabetes, or intractable back pain.  She has a persistent rash that 'comes and goes,' which she attributes to allergies. The rash began after her second chemotherapy treatment and has progressively worsened each year since she was 89. It is characterized by stinging, burning, and itching, extending from her knees to her neck. She received a prednisone injection and uses a cream, which have provided some improvement, but she describes the current state as 'a hundred times better than it was.' She has not seen an allergist but has consulted a dermatologist, although no biopsy has been performed.  Her family history is significant for pancreatic  cancer in her maternal grandmother, who was diagnosed at a relatively young age, under 62 or 72 years old.  Socially, she remains active, walking three to five days a week, covering three to four miles per session. She is involved in her grandchildren's activities, driving them to school and events.    COVID 19 VACCINATION STATUS: Status post COVID November 2021 and September 2022, status post [employer] x3 as of October 2020   CANCER HISTORY: From the original intake note:  [name] was diagnosed with ovarian cancer in April 2003. She underwent total abdominal hysterectomy and bilateral salpingo-oophorectomy with a full staging operation at [facility] that month and was then treated according to GOG 182, with 4 cycles of carboplatin/topotecan and then 4 cycles of carboplatin/paclitaxel. She was then followed by gynecologic oncology through 2011, and there has been no history of recurrent disease.  On 01/20/2015 [name] underwent screening mammography at [facility] with tomosynthesis. Breast density was category A. New grouped calcifications were noted in the left breast upper outer quadrant and the patient was recalled for diagnostic unilateral left mammography 01/26/2015. This confirmed a group of new linear calcifications in the upper outer quadrant of the left breast extending over an area of 2.0 cm..   On 01/28/2015 the patient underwent biopsy of her left breast, with the pathology ([id number]) showing an invasive ductal carcinoma, grade 2, estrogen receptor 100% positive, progesterone receptor 50% positive, both with strong staining intensity, with an MIB-1 of 10% and no HER-2 amplification, the signals ratio being 1.21 and the number per cell 2.00.  Her subsequent history is as detailed below PAST MEDICAL HISTORY:  Past Medical History:  Diagnosis Date   Allergy    seasonal allergies   Arthritis    generalized   Breast cancer (  HCC) 2017   KFM in BRCA1   Family history of breast cancer    Family  history of pancreatic cancer    FH: BRCA1 gene positive    Osteoporosis    Ovarian cancer (HCC) 2003   Pericarditis, acute    30 YRS AGO    SAH (subarachnoid hemorrhage) (HCC)    04/2014   Stroke (HCC)    no deficits    PAST SURGICAL HISTORY: Past Surgical History:  Procedure Laterality Date   BREAST RECONSTRUCTION WITH PLACEMENT OF TISSUE EXPANDER AND FLEX HD (ACELLULAR HYDRATED DERMIS) Bilateral 03/18/2015   Procedure: BILATERAL BREAST RECONSTRUCTION WITH PLACEMENT OF TISSUE EXPANDER AND FLEX HD (ACELLULAR HYDRATED DERMIS);  Surgeon: Alena Bills Dillingham, DO;  Location: [facility];  Service: Plastics;  Laterality: Bilateral;   CESAREAN SECTION     X2   COLONOSCOPY  2016   COLONOSCOPY  11/12/2019   HERNIA REPAIR     LIPOSUCTION WITH LIPOFILLING Bilateral 12/02/2015   Procedure: LIPOSUCTION WITH LIPOFILLING FROM ABDOMEN TO BILATERAL BREAST;  Surgeon: Peggye Form, DO;  Location: [facility];  Service: Plastics;  Laterality: Bilateral;   LIPOSUCTION WITH LIPOFILLING Bilateral 03/23/2016   Procedure: BILATERAL FAT GRAFTING;  Surgeon: Peggye Form, DO;  Location: [facility];  Service: Plastics;  Laterality: Bilateral;   MASTECTOMY W/ SENTINEL NODE BIOPSY Left 03/18/2015   Procedure: LEFT TOTAL MASTECTOMY WITH LEFT AXILLARY SENTINEL LYMPH NODE BIOPSY;  Surgeon: Glenna Fellows, MD;  Location: [facility];  Service: General;  Laterality: Left;   MASTECTOMY, PARTIAL Right 03/18/2015   Procedure: RIGHT PROPHYLACTIC MASTECTOMY ;  Surgeon: Glenna Fellows, MD;  Location: [facility];  Service: General;  Laterality: Right;   POLYPECTOMY     REMOVAL OF BILATERAL TISSUE EXPANDERS WITH PLACEMENT OF BILATERAL BREAST IMPLANTS Bilateral 08/12/2015   Procedure: REMOVAL OF BILATERAL TISSUE EXPANDERS WITH PLACEMENT OF BILATERAL BREAST IMPLANTS;  Surgeon: Peggye Form, DO;  Location: [facility];  Service: Plastics;  Laterality: Bilateral;   TONSILLECTOMY      TOTAL ABDOMINAL HYSTERECTOMY W/ BILATERAL SALPINGOOPHORECTOMY      FAMILY HISTORY Family History  Problem Relation Age of Onset   Colon polyps Mother 81   Breast cancer Daughter 4   BRCA 1/2 Daughter        BRCA1   COPD Maternal Aunt    Esophageal cancer Maternal Uncle 73   Lung cancer Paternal Aunt    Pancreatic cancer Maternal Grandmother        early 55s   Prostate cancer Maternal Grandfather        20s   Skin cancer Paternal Grandmother        untreated BCC   COPD Maternal Aunt    Lung cancer Maternal Aunt    Breast cancer Cousin 77       maternal first cousin   BRCA 1/2 Daughter        BRCA1   Esophageal cancer Cousin 53       maternal first cousin   Bladder Cancer Paternal Aunt    Colon cancer Neg Hx    Rectal cancer Neg Hx    Ulcerative colitis Neg Hx    Stomach cancer Neg Hx    the patient's 2 daughters have been tested for the BRCA gene and both are positive. One of them had breast cancer at the age of 43. In addition on the maternal side there is pancreatic prostate breast and esophageal cancer. The patient's father died at the  age of 3 and a car accident and there is very little information on his side of the family. The patient's mother died at 41 from COPD. She had had a hysterectomy in her 30s. The patient's mother had 5 siblings several from died young. The patient herself had no sisters, 2 brothers.   GYNECOLOGIC HISTORY:  No LMP recorded. Patient has had a hysterectomy.  Menarche age 69, first live birth age 53, the patient is GX P2. She underwent TAH/BSO in 2003 for her ovarian cancer. She took hormone replacement for approximately 6 months. She was on oral contraceptives for approximately 4 years remotely, with no complications.   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 Her husband Onalee Hua works for a [employer]. Daughter Cj Beecher lives in [place] where she works as a  school principal. She is 36 years old. Daughter Phaedra Colgate lives in [place] and is a Runner, broadcasting/film/video. She is 72 years old. These ages are as of January 2017. The patient has 5 grandchildren. She attends a [employer]     ADVANCED DIRECTIVES: Not in place   HEALTH MAINTENANCE: Social History   Tobacco Use   Smoking status: Never   Smokeless tobacco: Never  Vaping Use   Vaping status: Never Used  Substance Use Topics   Alcohol use: Not Currently    Alcohol/week: 0.0 standard drinks of alcohol   Drug use: No     Colonoscopy: September 2016/ [facility]  PAP:  Bone density: 2016  Lipid panel:  Allergies  Allergen Reactions   Taxotere [Docetaxel] Rash   Penicillins Itching and Rash    Has patient had a PCN reaction causing immediate rash, facial/tongue/throat swelling, SOB or lightheadedness with hypotension: No Has patient had a PCN reaction causing severe rash involving mucus membranes or skin necrosis: No Has patient had a PCN reaction that required hospitalization No Has patient had a PCN reaction occurring within the last 10 years: No If all of the above answers are "NO", then may proceed with Cephalosporin use.     Current Outpatient Medications  Medication Sig Dispense Refill   anastrozole (ARIMIDEX) 1 MG tablet Take 1 tablet (1 mg total) by mouth daily. 90 tablet 4   Ascorbic Acid (VITAMIN C) 1000 MG tablet Take 1,000 mg by mouth daily.      B Complex Vitamins (B COMPLEX PO) Take 2 tablets by mouth daily.     Denosumab (PROLIA [place]) Inject into the skin.     loratadine (CLARITIN) 10 MG tablet Take 10 mg by mouth daily.     Multiple Vitamin (MULTIVITAMIN WITH MINERALS) TABS tablet Take 2 tablets by mouth daily.     Turmeric (QC TUMERIC COMPLEX PO) Take by mouth.     Vitamin D, Cholecalciferol, 1000 units CAPS Take by mouth.     No current facility-administered medications for this visit.    OBJECTIVE: White woman who appears younger than stated age  Vitals:    04/12/23 1410  BP: (!) 157/64  Pulse: 70  Resp: 16  Temp: 97.6 F (36.4 C)  SpO2: 99%     Body mass index is 27.86 kg/m.    ECOG FS:1 - Symptomatic but completely ambulatory  Sclerae unicteric, EOMs intact Wearing a mask No cervical or supraclavicular adenopathy Breasts: Status post bilateral mastectomies with bilateral reconstruction.  There is no evidence of chest wall recurrence.  Both axillae are benign Skin: Diffuse maculopapular  rash on the back  LAB RESULTS:  CMP     Component Value Date/Time   NA 140 03/23/2022 1311   NA 142 07/15/2016 0828   K 4.2 03/23/2022 1311   K 4.2 07/15/2016 0828   CL 106 03/23/2022 1311   CO2 28 03/23/2022 1311   CO2 25 07/15/2016 0828   GLUCOSE 90 03/23/2022 1311   GLUCOSE 94 07/15/2016 0828   BUN 14 03/23/2022 1311   BUN 14.8 07/15/2016 0828   CREATININE 0.78 03/23/2022 1311   CREATININE 0.8 07/15/2016 0828   CALCIUM 9.7 03/23/2022 1311   CALCIUM 9.9 07/15/2016 0828   PROT 7.4 03/23/2022 1311   PROT 6.6 07/15/2016 0828   ALBUMIN 4.2 03/23/2022 1311   ALBUMIN 3.8 07/15/2016 0828   AST 21 03/23/2022 1311   AST 22 07/15/2016 0828   ALT 17 03/23/2022 1311   ALT 19 07/15/2016 0828   ALKPHOS 80 03/23/2022 1311   ALKPHOS 103 07/15/2016 0828   BILITOT 0.4 03/23/2022 1311   BILITOT 0.39 07/15/2016 0828   GFRNONAA >60 03/23/2022 1311   GFRAA >60 08/12/2019 1127    INo results found for: "SPEP", "UPEP"  Lab Results  Component Value Date   WBC 7.8 03/23/2022   NEUTROABS 5.2 03/23/2022   HGB 13.1 03/23/2022   HCT 39.0 03/23/2022   MCV 88.8 03/23/2022   PLT 313 03/23/2022      Chemistry      Component Value Date/Time   NA 140 03/23/2022 1311   NA 142 07/15/2016 0828   K 4.2 03/23/2022 1311   K 4.2 07/15/2016 0828   CL 106 03/23/2022 1311   CO2 28 03/23/2022 1311   CO2 25 07/15/2016 0828   BUN 14 03/23/2022 1311   BUN 14.8 07/15/2016 0828   CREATININE 0.78 03/23/2022 1311   CREATININE 0.8 07/15/2016 0828       Component Value Date/Time   CALCIUM 9.7 03/23/2022 1311   CALCIUM 9.9 07/15/2016 0828   ALKPHOS 80 03/23/2022 1311   ALKPHOS 103 07/15/2016 0828   AST 21 03/23/2022 1311   AST 22 07/15/2016 0828   ALT 17 03/23/2022 1311   ALT 19 07/15/2016 0828   BILITOT 0.4 03/23/2022 1311   BILITOT 0.39 07/15/2016 0828      No results found for: "LABCA2"  No components found for: "LABCA125"  No results for input(s): "INR" in the last 168 hours.  Urinalysis No results found for: "COLORURINE", "APPEARANCEUR", "LABSPEC", "PHURINE", "GLUCOSEU", "HGBUR", "BILIRUBINUR", "KETONESUR", "PROTEINUR", "UROBILINOGEN", "NITRITE", "LEUKOCYTESUR"   STUDIES: No results found.   ASSESSMENT: 73 y.o. BRCA1 positive [place], [place] woman  (1) ovarian cancer stage IIIB diagnosed April 2003  (a) s/p supracervical hysterectomy and BSO with surgical staging April 2003  (b) treated according to GOG 182: carboplatin/ topotecan x4 followed by carboplatin/taxol x4  (c) last chemotherapy dose October 2003  (2) status post left breast upper outer quadrant biopsy 01/28/2015 for an invasive ductal carcinoma, grade 2, estrogen and progesterone receptor positive, HER-2 not amplified, with an MIB-1 of 10%  (a) tamoxifen started neoadjuvantly due to concerns regarding surgical delay, interrupted with chemotherapy  (3) status post bilateral mastectomies with left axillary lymph node sampling 03/18/2015 showing  (a) on the right, benign disease  (b) on the left, a pT1c pN0, stage IA invasive ductal carcinoma, repeat HER-2 again negative, stage IA    (4) Oncotype DX score of 29, in the high intermediate range, predicts a risk of recurrence with tamoxifen alone of 19%. The addition of  chemotherapy in this setting is predicted to decrease distant disease-free recurrence by approximately 6%.   (5) adjuvant chemotherapy with cyclophosphamide and docetaxel started 04/22/2015, repeated every 21 days 4  (a) gemcitabine switched for  docetaxel for cycles 3 and 4 due to rash.  (b) chemotherapy completed 06/24/2015  (6) adjuvant anastrozole started 07/29/2015, completing 5+ years October 2022  (a) bone density at [facility] 06/09/2014 shows a T score of -2.4; this is increased from prior  (b) bone density at [facility] 07/08/2016 shows a T score of -2.3, which is stable  (c) denosumab/Prolia started through Dr. Miguel Rota office 2021 (d) bone density at [facility] 07/30/2019 shows a T score of -2.2, stable  (7) pancreatic cancer screening  (a) status post MRCP 11/12/2018 showing no evidence of pancreatic cancer  (b) repeat MRCP November 2022  PLAN: She is here for follow up.  No concerns for breast cancer recurrence.  Bilateral breasts status postmastectomy. She also had ovarian cancer and had total hysterectomy.  She does have some family history of pancreatic cancer hence underwent a pancreatic cancer screening back in 2024.  No symptoms concerning for pancreatic cancer nor physical examination findings. Repeat MRCP ordered for this yr. I have ordered an MRI abdomen for pancreatic cancer screening. She will reach out back to [place] in 1 year or sooner as needed for follow-up.  For chronic rash, I recommended she talk to her dermatologist or PCP to do allergy referral. She will discuss with them  Time spent: 30 min  *Total Encounter Time as defined by the Centers for Medicare and Medicaid Services includes, in addition to the face-to-face time of a patient visit (documented in the note above) non-face-to-face time: obtaining and reviewing outside history, ordering and reviewing medications, tests or procedures, care coordination (communications with other health care professionals or caregivers) and documentation in the medical record.

## 2023-05-03 ENCOUNTER — Ambulatory Visit (HOSPITAL_COMMUNITY)

## 2023-05-10 DIAGNOSIS — M8580 Other specified disorders of bone density and structure, unspecified site: Secondary | ICD-10-CM | POA: Diagnosis not present

## 2023-05-10 DIAGNOSIS — F419 Anxiety disorder, unspecified: Secondary | ICD-10-CM | POA: Diagnosis not present

## 2023-05-10 DIAGNOSIS — Z79899 Other long term (current) drug therapy: Secondary | ICD-10-CM | POA: Diagnosis not present

## 2023-05-10 DIAGNOSIS — Z1331 Encounter for screening for depression: Secondary | ICD-10-CM | POA: Diagnosis not present

## 2023-05-10 DIAGNOSIS — J3089 Other allergic rhinitis: Secondary | ICD-10-CM | POA: Diagnosis not present

## 2023-05-10 DIAGNOSIS — E7800 Pure hypercholesterolemia, unspecified: Secondary | ICD-10-CM | POA: Diagnosis not present

## 2023-05-10 DIAGNOSIS — Z9181 History of falling: Secondary | ICD-10-CM | POA: Diagnosis not present

## 2023-05-10 DIAGNOSIS — L309 Dermatitis, unspecified: Secondary | ICD-10-CM | POA: Diagnosis not present

## 2023-05-10 DIAGNOSIS — Z23 Encounter for immunization: Secondary | ICD-10-CM | POA: Diagnosis not present

## 2023-05-22 ENCOUNTER — Ambulatory Visit (HOSPITAL_COMMUNITY)
Admission: RE | Admit: 2023-05-22 | Discharge: 2023-05-22 | Disposition: A | Discharge status: Home / Self Care | Source: Ambulatory Visit | Attending: Hematology and Oncology | Admitting: Hematology and Oncology

## 2023-05-22 ENCOUNTER — Other Ambulatory Visit: Payer: Self-pay | Admitting: Hematology and Oncology

## 2023-05-22 DIAGNOSIS — C259 Malignant neoplasm of pancreas, unspecified: Secondary | ICD-10-CM | POA: Diagnosis not present

## 2023-05-22 DIAGNOSIS — Z170 Estrogen receptor positive status [ER+]: Secondary | ICD-10-CM | POA: Diagnosis not present

## 2023-05-22 DIAGNOSIS — Z1509 Genetic susceptibility to other malignant neoplasm: Secondary | ICD-10-CM | POA: Diagnosis not present

## 2023-05-22 DIAGNOSIS — Z1501 Genetic susceptibility to malignant neoplasm of breast: Secondary | ICD-10-CM

## 2023-05-22 DIAGNOSIS — C50412 Malignant neoplasm of upper-outer quadrant of left female breast: Secondary | ICD-10-CM

## 2023-05-22 DIAGNOSIS — Z800 Family history of malignant neoplasm of digestive organs: Secondary | ICD-10-CM

## 2023-05-22 MED ORDER — GADOBUTROL 1 MMOL/ML IV SOLN
7.0000 mL | Freq: Once | INTRAVENOUS | Status: AC | PRN
  Administered 2023-05-22: 7 mL via INTRAVENOUS

## 2023-05-25 ENCOUNTER — Telehealth: Payer: Self-pay

## 2023-05-25 NOTE — Telephone Encounter (Signed)
 Attempted to call pt. Left detailed msg on verified VM.

## 2023-05-25 NOTE — Telephone Encounter (Signed)
-----   Message from [place] Iruku sent at 05/25/2023  1:05 PM EDT ----- MR abdomen no abnormality noted on pancreas.

## 2023-05-26 ENCOUNTER — Telehealth: Payer: Self-pay | Admitting: *Deleted

## 2023-05-26 NOTE — Telephone Encounter (Signed)
 Called pt & informed per Dr Arno Bibles that MRI was normal.  She had already seen on MyChart but expressed appreciation for call.

## 2023-05-31 DIAGNOSIS — M8580 Other specified disorders of bone density and structure, unspecified site: Secondary | ICD-10-CM | POA: Diagnosis not present

## 2023-07-04 DIAGNOSIS — R7401 Elevation of levels of liver transaminase levels: Secondary | ICD-10-CM | POA: Diagnosis not present

## 2023-07-04 DIAGNOSIS — F419 Anxiety disorder, unspecified: Secondary | ICD-10-CM | POA: Diagnosis not present

## 2023-09-15 DIAGNOSIS — M85852 Other specified disorders of bone density and structure, left thigh: Secondary | ICD-10-CM | POA: Diagnosis not present

## 2023-09-15 DIAGNOSIS — M85851 Other specified disorders of bone density and structure, right thigh: Secondary | ICD-10-CM | POA: Diagnosis not present

## 2023-11-10 DIAGNOSIS — S81802A Unspecified open wound, left lower leg, initial encounter: Secondary | ICD-10-CM | POA: Diagnosis not present

## 2023-12-25 DIAGNOSIS — M8580 Other specified disorders of bone density and structure, unspecified site: Secondary | ICD-10-CM | POA: Diagnosis not present

## 2024-01-19 ENCOUNTER — Telehealth: Payer: Self-pay | Admitting: Hematology and Oncology

## 2024-01-19 NOTE — Telephone Encounter (Signed)
 I left voicemail for patient regarding time change for MD visit on 04/11/2024.

## 2024-04-11 ENCOUNTER — Ambulatory Visit: Admitting: Hematology and Oncology

## 2024-04-11 ENCOUNTER — Inpatient Hospital Stay: Admitting: Hematology and Oncology
